# Patient Record
Sex: Male | Born: 1985 | Race: Black or African American | Hispanic: No | Marital: Single | Smoking: Current every day smoker
Health system: Southern US, Community
[De-identification: ages and names within clinical notes are randomized; demographics above are authoritative.]

## PROBLEM LIST (undated history)

## (undated) DIAGNOSIS — E109 Type 1 diabetes mellitus without complications: Secondary | ICD-10-CM

## (undated) DIAGNOSIS — S129XXA Fracture of neck, unspecified, initial encounter: Secondary | ICD-10-CM

## (undated) HISTORY — PX: NO PAST SURGERIES: SHX2092

---

## 2016-06-07 ENCOUNTER — Emergency Department
Admission: EM | Admit: 2016-06-07 | Discharge: 2016-06-07 | Disposition: A | Discharge status: Home / Self Care | Payer: 59 | Attending: Emergency Medicine | Admitting: Emergency Medicine

## 2016-06-07 ENCOUNTER — Emergency Department: Payer: 59

## 2016-06-07 ENCOUNTER — Encounter: Payer: Self-pay | Admitting: Medical Oncology

## 2016-06-07 DIAGNOSIS — S161XXA Strain of muscle, fascia and tendon at neck level, initial encounter: Secondary | ICD-10-CM

## 2016-06-07 DIAGNOSIS — S39012A Strain of muscle, fascia and tendon of lower back, initial encounter: Secondary | ICD-10-CM | POA: Diagnosis not present

## 2016-06-07 DIAGNOSIS — F17200 Nicotine dependence, unspecified, uncomplicated: Secondary | ICD-10-CM | POA: Insufficient documentation

## 2016-06-07 DIAGNOSIS — Y999 Unspecified external cause status: Secondary | ICD-10-CM | POA: Insufficient documentation

## 2016-06-07 DIAGNOSIS — Y92410 Unspecified street and highway as the place of occurrence of the external cause: Secondary | ICD-10-CM | POA: Diagnosis not present

## 2016-06-07 DIAGNOSIS — Y939 Activity, unspecified: Secondary | ICD-10-CM | POA: Insufficient documentation

## 2016-06-07 DIAGNOSIS — S199XXA Unspecified injury of neck, initial encounter: Secondary | ICD-10-CM | POA: Diagnosis present

## 2016-06-07 DIAGNOSIS — M25512 Pain in left shoulder: Secondary | ICD-10-CM | POA: Diagnosis not present

## 2016-06-07 MED ORDER — ACETAMINOPHEN-CODEINE #3 300-30 MG PO TABS: 1.0000 | ORAL_TABLET | Freq: Four times a day (QID) | ORAL | 0 refills | Status: DC | PRN

## 2016-06-07 MED ORDER — CYCLOBENZAPRINE HCL 5 MG PO TABS: 5.0000 mg | ORAL_TABLET | Freq: Three times a day (TID) | ORAL | 0 refills | Status: DC | PRN

## 2016-06-07 MED ORDER — ACETAMINOPHEN-CODEINE #3 300-30 MG PO TABS
1.0000 | ORAL_TABLET | Freq: Once | ORAL | Status: AC
Start: 2016-06-07 — End: 2016-06-07
  Administered 2016-06-07: 1 via ORAL
  Filled 2016-06-07: qty 1

## 2016-06-07 MED ORDER — CYCLOBENZAPRINE HCL 10 MG PO TABS
10.0000 mg | ORAL_TABLET | Freq: Once | ORAL | Status: AC
  Administered 2016-06-07: 10 mg via ORAL

## 2016-06-07 MED ORDER — CYCLOBENZAPRINE HCL 10 MG PO TABS
ORAL_TABLET | ORAL | Status: AC
  Filled 2016-06-07: qty 1
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
## 2016-07-05 ENCOUNTER — Encounter: Payer: Self-pay | Admitting: Emergency Medicine

## 2016-07-05 ENCOUNTER — Emergency Department
Admission: EM | Admit: 2016-07-05 | Discharge: 2016-07-05 | Disposition: A | Discharge status: Home / Self Care | Payer: 59 | Attending: Emergency Medicine | Admitting: Emergency Medicine

## 2016-07-05 DIAGNOSIS — F17210 Nicotine dependence, cigarettes, uncomplicated: Secondary | ICD-10-CM | POA: Insufficient documentation

## 2016-07-05 DIAGNOSIS — R739 Hyperglycemia, unspecified: Secondary | ICD-10-CM

## 2016-07-05 DIAGNOSIS — E1065 Type 1 diabetes mellitus with hyperglycemia: Secondary | ICD-10-CM | POA: Insufficient documentation

## 2016-07-05 HISTORY — DX: Type 1 diabetes mellitus without complications: E10.9

## 2016-07-05 HISTORY — DX: Fracture of neck, unspecified, initial encounter: S12.9XXA

## 2016-07-05 LAB — URINALYSIS, COMPLETE (UACMP) WITH MICROSCOPIC
BACTERIA UA: NONE SEEN
BILIRUBIN URINE: NEGATIVE
Glucose, UA: >=500 mg/dL — AB
Hgb urine dipstick: NEGATIVE
KETONES UR: 80 mg/dL — AB
Leukocytes, UA: NEGATIVE
Nitrite: NEGATIVE
PH: 5 (ref 5.0–8.0)
PROTEIN: NEGATIVE mg/dL
RBC / HPF: NONE SEEN RBC/hpf (ref 0–5)
Specific Gravity, Urine: 1.026 (ref 1.005–1.030)

## 2016-07-05 LAB — BASIC METABOLIC PANEL
Anion gap: 16 — ABNORMAL HIGH (ref 5–15)
BUN: 18 mg/dL (ref 6–20)
CHLORIDE: 91 mmol/L — AB (ref 101–111)
CO2: 23 mmol/L (ref 22–32)
CREATININE: 0.93 mg/dL (ref 0.61–1.24)
Calcium: 8.8 mg/dL — ABNORMAL LOW (ref 8.9–10.3)
GFR calc Af Amer: >60 mL/min (ref >60)
GFR calc non Af Amer: >60 mL/min (ref >60)
Glucose, Bld: 366 mg/dL — ABNORMAL HIGH (ref 65–99)
Potassium: 3.9 mmol/L (ref 3.5–5.1)
SODIUM: 130 mmol/L — AB (ref 135–145)

## 2016-07-05 LAB — GLUCOSE, CAPILLARY
GLUCOSE-CAPILLARY: 375 mg/dL — AB (ref 65–99)
GLUCOSE-CAPILLARY: 206 mg/dL — AB (ref 65–99)
Glucose-Capillary: 188 mg/dL — ABNORMAL HIGH (ref 65–99)

## 2016-07-05 LAB — CBC
HCT: 40.8 % (ref 40.0–52.0)
Hemoglobin: 13.6 g/dL (ref 13.0–18.0)
MCH: 31.3 pg (ref 26.0–34.0)
MCHC: 33.3 g/dL (ref 32.0–36.0)
MCV: 94.1 fL (ref 80.0–100.0)
Platelets: 249 10*3/uL (ref 150–440)
RBC: 4.34 MIL/uL — ABNORMAL LOW (ref 4.40–5.90)
RDW: 12.9 % (ref 11.5–14.5)
WBC: 14.3 10*3/uL — AB (ref 3.8–10.6)

## 2016-07-05 MED ORDER — INSULIN ASPART 100 UNIT/ML ~~LOC~~ SOLN
2.0000 [IU] | Freq: Once | SUBCUTANEOUS | Status: AC
  Administered 2016-07-05: 2 [IU] via INTRAVENOUS
  Filled 2016-07-05: qty 2

## 2016-07-05 MED ORDER — SODIUM CHLORIDE 0.9 % IV SOLN
Freq: Once | INTRAVENOUS | Status: AC
  Administered 2016-07-05: 13:00:00 via INTRAVENOUS

## 2016-07-05 NOTE — Discharge Instructions (Signed)
Please monitor your blood glucoses carefully. Use your insulin to keep your sugar under control Please follow-up with [facility] or the doctor of your choice. Please return here if you're sugars get high again. Also return for fever vomiting abdominal pain or feeling sicker.

## 2016-07-05 NOTE — ED Provider Notes (Signed)
[facility] Emergency Department Provider Note   ____________________________________________   First MD Initiated Contact with Patient 07/05/16 1239     (approximate)  I have reviewed the triage vital signs and the nursing notes.   HISTORY  Chief Complaint Hyperglycemia    HPI Wilferd Ritson is a 31 y.o. male patient had some fried pork yesterday. He vomited several times after that drank a lot of sports drinks to help with the vomiting and noticed his sugar was going up. Didn't really feel all that well since then. Today his sugar was high. He came in by EMS. He feels generally okay but not really well. Nothing starting him. He is not running a fever. He is not having any further vomiting dysuria coughing or any other complaints.  Past Medical History:  Diagnosis Date  . Compression fracture of C-spine (HCC)   . Type 1 diabetes (HCC)     There are no active problems to display for this patient.   History reviewed. No pertinent surgical history.  Prior to Admission medications   Medication Sig Start Date End Date Taking? Authorizing Provider  acetaminophen-codeine (TYLENOL #3) 300-30 MG tablet Take 1 tablet by mouth every 6 (six) hours as needed for moderate pain. 06/07/16   Menshew, Charlesetta Ivory, PA-C  cyclobenzaprine (FLEXERIL) 5 MG tablet Take 1 tablet (5 mg total) by mouth 3 (three) times daily as needed for muscle spasms. 06/07/16   Menshew, Charlesetta Ivory, PA-C    Allergies Patient has no known allergies.  No family history on file.  Social History Social History  Substance Use Topics  . Smoking status: Current Every Day Smoker    Packs/day: 0.50    Types: Cigarettes  . Smokeless tobacco: Never Used  . Alcohol use Yes     Comment: occasionally    Review of Systems  Constitutional: No fever/chills Eyes: No visual changes. ENT: No sore throat. Cardiovascular: Denies chest pain. Respiratory: Denies shortness of  breath. Gastrointestinal: No abdominal pain.  No nausea, no vomiting.  No diarrhea.  No constipation. Genitourinary: Negative for dysuria. Musculoskeletal: Negative for back pain. Skin: Negative for rash. Neurological: Negative for headaches, focal weakness or numbness.   ____________________________________________   PHYSICAL EXAM:  VITAL SIGNS: ED Triage Vitals  Enc Vitals Group     BP 07/05/16 1245 (!) 145/110     Pulse Rate 07/05/16 1245 (!) 101     Resp 07/05/16 1245 18     Temp 07/05/16 1245 98.5 F (36.9 C)     Temp Source 07/05/16 1245 Oral     SpO2 07/05/16 1245 100 %     Weight 07/05/16 1246 130 lb (59 kg)     Height 07/05/16 1246 5\' 9"  (1.753 m)     Head Circumference --      Peak Flow --      Pain Score 07/05/16 1245 0     Pain Loc --      Pain Edu? --      Excl. in GC? --     Constitutional: Alert and oriented. Well appearing and in no acute distress. Eyes: Conjunctivae are normal. PERRL. EOMI. Head: Atraumatic. Nose: No congestion/rhinnorhea. Mouth/Throat: Mucous membranes are moist.  Oropharynx non-erythematous. Neck: No stridor.  Cardiovascular: Normal rate, regular rhythm. Grossly normal heart sounds.  Good peripheral circulation. Respiratory: Normal respiratory effort.  No retractions. Lungs CTAB. Gastrointestinal: Soft and nontender. No distention. No abdominal bruits. No CVA tenderness. Musculoskeletal: No lower extremity tenderness nor edema.  No joint effusions. Neurologic:  Normal speech and language. No gross focal neurologic deficits are appreciated. No gait instability. Skin:  Skin is warm, dry and intact. No rash noted. Psychiatric: Mood and affect are normal. Speech and behavior are normal.  ____________________________________________   LABS (all labs ordered are listed, but only abnormal results are displayed)  Labs Reviewed  BASIC METABOLIC PANEL - Abnormal; Notable for the following:       Result Value   Sodium 130 (*)     Chloride 91 (*)    Glucose, Bld 366 (*)    Calcium 8.8 (*)    Anion gap 16 (*)    All other components within normal limits  CBC - Abnormal; Notable for the following:    WBC 14.3 (*)    RBC 4.34 (*)    All other components within normal limits  URINALYSIS, COMPLETE (UACMP) WITH MICROSCOPIC - Abnormal; Notable for the following:    Color, Urine STRAW (*)    APPearance CLEAR (*)    Glucose, UA >=500 (*)    Ketones, ur 80 (*)    Squamous Epithelial / LPF 0-5 (*)    All other components within normal limits  GLUCOSE, CAPILLARY - Abnormal; Notable for the following:    Glucose-Capillary 375 (*)    All other components within normal limits  GLUCOSE, CAPILLARY - Abnormal; Notable for the following:    Glucose-Capillary 188 (*)    All other components within normal limits  GLUCOSE, CAPILLARY  CBG MONITORING, ED   ____________________________________________  EKG   ____________________________________________  RADIOLOGY   ____________________________________________   PROCEDURES  Procedure(s) performed:  Procedures  Critical Care performed:   ____________________________________________   INITIAL IMPRESSION / ASSESSMENT AND PLAN / ED COURSE  Pertinent labs & imaging results that were available during my care of the patient were reviewed by me and considered in my medical decision making (see chart for details).   Patient's blood sugar is now down remains down even when eating. He feels much better. I will let him go.     ____________________________________________   FINAL CLINICAL IMPRESSION(S) / ED DIAGNOSES  Final diagnoses:  Hyperglycemia      NEW MEDICATIONS STARTED DURING THIS VISIT:  New Prescriptions   No medications on file     Note:  This document was prepared using Dragon voice recognition software and may include unintentional dictation errors.    Arnaldo NatalMalinda, Paul F, MD 07/05/16 1536

## 2016-07-05 NOTE — ED Triage Notes (Signed)
Patient presents to the ED via EMS for high blood sugar and vomiting.  Patient reports vomiting approx. 4 times today and reports noticing his sugar was high last night.  Patient denies any pain.  Patient reports eating 2 fried pork chops last night and states he didn't feel well after that.  Patient is in no obvious distress at this time.

## 2016-07-05 NOTE — ED Notes (Signed)
Patient discharged with instructions to establish with a pcp. Patient refused wheel chair. Patients rise will pick him up around 5:40pm. Patient discharged to lobby.

## 2017-02-12 ENCOUNTER — Encounter: Payer: Self-pay | Admitting: Emergency Medicine

## 2017-02-12 ENCOUNTER — Emergency Department
Admission: EM | Admit: 2017-02-12 | Discharge: 2017-02-12 | Disposition: A | Discharge status: Home / Self Care | Payer: 59 | Attending: Student in an Organized Health Care Education/Training Program | Admitting: Student in an Organized Health Care Education/Training Program

## 2017-02-12 ENCOUNTER — Other Ambulatory Visit: Payer: Self-pay

## 2017-02-12 DIAGNOSIS — F17210 Nicotine dependence, cigarettes, uncomplicated: Secondary | ICD-10-CM | POA: Insufficient documentation

## 2017-02-12 DIAGNOSIS — Y999 Unspecified external cause status: Secondary | ICD-10-CM | POA: Insufficient documentation

## 2017-02-12 DIAGNOSIS — E1065 Type 1 diabetes mellitus with hyperglycemia: Secondary | ICD-10-CM | POA: Insufficient documentation

## 2017-02-12 DIAGNOSIS — Y939 Activity, unspecified: Secondary | ICD-10-CM | POA: Insufficient documentation

## 2017-02-12 DIAGNOSIS — Y929 Unspecified place or not applicable: Secondary | ICD-10-CM | POA: Insufficient documentation

## 2017-02-12 DIAGNOSIS — Z794 Long term (current) use of insulin: Secondary | ICD-10-CM | POA: Insufficient documentation

## 2017-02-12 DIAGNOSIS — X58XXXA Exposure to other specified factors, initial encounter: Secondary | ICD-10-CM | POA: Insufficient documentation

## 2017-02-12 DIAGNOSIS — S90422A Blister (nonthermal), left great toe, initial encounter: Secondary | ICD-10-CM | POA: Insufficient documentation

## 2017-02-12 DIAGNOSIS — IMO0001 Reserved for inherently not codable concepts without codable children: Secondary | ICD-10-CM

## 2017-02-12 LAB — GLUCOSE, CAPILLARY: GLUCOSE-CAPILLARY: 299 mg/dL — AB (ref 65–99)

## 2017-02-12 MED ORDER — CEPHALEXIN 500 MG PO CAPS: 500.0000 mg | ORAL_CAPSULE | Freq: Three times a day (TID) | ORAL | 0 refills | Status: DC

## 2017-02-12 MED ORDER — SULFAMETHOXAZOLE-TRIMETHOPRIM 800-160 MG PO TABS
1.0000 | ORAL_TABLET | Freq: Once | ORAL | Status: AC
  Administered 2017-02-12: 1 via ORAL
  Filled 2017-02-12: qty 1

## 2017-02-12 MED ORDER — HYDROCODONE-ACETAMINOPHEN 5-325 MG PO TABS
1.0000 | ORAL_TABLET | Freq: Once | ORAL | Status: AC
  Administered 2017-02-12: 1 via ORAL
  Filled 2017-02-12: qty 1

## 2017-02-12 MED ORDER — HYDROCODONE-ACETAMINOPHEN 5-325 MG PO TABS: 1.0000 | ORAL_TABLET | Freq: Four times a day (QID) | ORAL | 0 refills | Status: DC | PRN

## 2017-02-12 MED ORDER — SULFAMETHOXAZOLE-TRIMETHOPRIM 800-160 MG PO TABS: 1.0000 | ORAL_TABLET | Freq: Two times a day (BID) | ORAL | 0 refills | Status: DC

## 2017-02-12 MED ORDER — CEPHALEXIN 500 MG PO CAPS
500.0000 mg | ORAL_CAPSULE | Freq: Once | ORAL | Status: AC
  Administered 2017-02-12: 500 mg via ORAL
  Filled 2017-02-12: qty 1

## 2017-02-12 NOTE — ED Provider Notes (Signed)
[facility] Emergency Department Provider Note   ____________________________________________   First MD Initiated Contact with Patient 02/12/17 1243     (approximate)  I have reviewed the triage vital signs and the nursing notes.   HISTORY  Chief Complaint Blister   HPI Garrett [name] is a 31 y.o. male if you have complaint of blister to his left great toe that began approximately one week ago. Patient states that he wears work boots that were up against his toe. He states that blister is now open and draining. Patient is concerned because he is an insulin-dependent diabetic. He denies any fever or chills. He denies any previous problems with his foot.he rates his pain is 7 out of 10.   Past Medical History:  Diagnosis Date  . Compression fracture of C-spine (HCC)   . Type 1 diabetes (HCC)     There are no active problems to display for this patient.   No past surgical history on file.  Prior to Admission medications   Medication Sig Start Date End Date Taking? Authorizing Provider  insulin aspart (NOVOLOG) 100 UNIT/ML injection Inject into the skin 3 (three) times daily before meals. Uses sliding scale - max 14u at any dose   Yes [provider]  insulin aspart protamine- aspart (NOVOLOG MIX 70/30) (70-30) 100 UNIT/ML injection Inject 30 Units into the skin 2 (two) times daily.   Yes [provider]  cephALEXin (KEFLEX) 500 MG capsule Take 1 capsule (500 mg total) by mouth 3 (three) times daily. 02/12/17   Tommi RumpsSummers, Pammie Chirino L, PA-C  HYDROcodone-acetaminophen (NORCO/VICODIN) 5-325 MG tablet Take 1 tablet by mouth every 6 (six) hours as needed for moderate pain. 02/12/17   Tommi RumpsSummers, Johnette Teigen L, PA-C  sulfamethoxazole-trimethoprim (BACTRIM DS,SEPTRA DS) 800-160 MG tablet Take 1 tablet by mouth 2 (two) times daily. 02/12/17   Tommi RumpsSummers, Nycere Presley L, PA-C    Allergies Patient has no known allergies.  No family history on file.  Social  History Social History   Tobacco Use  . Smoking status: Current Every Day Smoker    Packs/day: 0.50    Types: Cigarettes  . Smokeless tobacco: Never Used  Substance Use Topics  . Alcohol use: Yes    Comment: occasionally  . Drug use: No    Review of Systems Constitutional: No fever/chills Cardiovascular: Denies chest pain. Respiratory: Denies shortness of breath. Musculoskeletal: positive left foot pain. Skin: positive skin injury left foot. Neurological: Negative for  focal weakness or numbness. ____________________________________________   PHYSICAL EXAM:  VITAL SIGNS: ED Triage Vitals  Enc Vitals Group     BP 02/12/17 1139 137/88     Pulse --      Resp 02/12/17 1139 18     Temp 02/12/17 1139 97.8 F (36.6 C)     Temp Source 02/12/17 1139 Oral     SpO2 02/12/17 1139 99 %     Weight 02/12/17 1140 130 lb (59 kg)     Height 02/12/17 1140 5\' 9"  (1.753 m)     Head Circumference --      Peak Flow --      Pain Score 02/12/17 1139 7     Pain Loc --      Pain Edu? --      Excl. in GC? --    Constitutional: Alert and oriented. Well appearing and in no acute distress. Eyes: Conjunctivae are normal.  Head: Atraumatic. Neck: No stridor.   Cardiovascular: Normal rate, regular rhythm. Grossly normal heart sounds.  Good peripheral circulation. Respiratory: Normal respiratory effort.  No retractions. Lungs CTAB. Musculoskeletal: moves upper and lower extremities without any difficulty. Normal gait was noted. Neurologic:  Normal speech and language. No gross focal neurologic deficits are appreciated. No gait instability. Skin:  Skin is warm, dry. Patient has a  shallow blister to the medial aspect of his great toe. No active drainage is noted. No erythema is present. No soft tissue swelling. Psychiatric: Mood and affect are normal. Speech and behavior are normal.  ____________________________________________   LABS (all labs ordered are listed, but only abnormal results are  displayed)  Labs Reviewed  GLUCOSE, CAPILLARY - Abnormal; Notable for the following components:      Result Value   Glucose-Capillary 299 (*)    All other components within normal limits  CBG MONITORING, ED     PROCEDURES  Procedure(s) performed: None  Procedures  Critical Care performed: No  ____________________________________________   INITIAL IMPRESSION / ASSESSMENT AND PLAN / ED COURSE Patient has not seen a PCP in "long time" and does not check his blood sugars. He was unaware that his glucose was elevated. Patient great toe was dressed. Patient is to clean daily with mild soap and water and watch for continued worsening. Patient was placed on Keflex 500 mg 3 times a day for 10 days and Bactrim DS twice a day for 10 days. Patient was given a list of clinics that he was eligible he is sitting [facility]. Patient is encouraged to see one of these clinics very soon to get his diabetes under better control. He was also given a note to remain out of work for the next 2 days. ____________________________________________   FINAL CLINICAL IMPRESSION(S) / ED DIAGNOSES  Final diagnoses:  Blister of left great toe, initial encounter  Uncontrolled diabetes mellitus type 1 without complications [facility](HCC)     ED Discharge Orders        Ordered    cephALEXin (KEFLEX) 500 MG capsule  3 times daily     02/12/17 1408    sulfamethoxazole-trimethoprim (BACTRIM DS,SEPTRA DS) 800-160 MG tablet  2 times daily     02/12/17 1408    HYDROcodone-acetaminophen (NORCO/VICODIN) 5-325 MG tablet  Every 6 hours PRN     02/12/17 1408       Note:  This document was prepared using Dragon voice recognition software and may include unintentional dictation errors.    Tommi RumpsSummers, Dellamae Rosamilia L, PA-C 02/12/17 1535    Willy Eddyobinson, Patrick, MD 02/13/17 [phone]

## 2017-02-12 NOTE — ED Notes (Signed)
Pharmacy tech coming to update med list

## 2017-02-12 NOTE — ED Notes (Signed)
Dry sterile dressing applied to patients wound

## 2017-02-12 NOTE — Discharge Instructions (Addendum)
Call and make an appointment with one of the clinics listed on your discharge papers. [facility], [facility], [facility], and [facility] all have fees based on your income and much cheaper. You will need to get your diabetes under better control. Clean your blister daily with mild soap and water. Keep clean and dry. Change dressing often. Begin taking Keflex and Bactrim DS. These two medications are for infection. Also Norco every 6 hours if needed for pain.Do not drive while taking this medication.

## 2017-02-12 NOTE — ED Triage Notes (Signed)
Pt reports blister to left great toe that began approximately one week ago. Blister has now popped open. No obvious signs of infection. Pt reports pain in left foot. CMS intact. Pt reports wears work boots that rub. Ambulatory to triage. No apparent distress noted. Pt reports is an insulin dependent diabetic.

## 2017-03-27 ENCOUNTER — Inpatient Hospital Stay
Admission: EM | Admit: 2017-03-27 | Discharge: 2017-03-29 | DRG: 637 | Disposition: A | Discharge status: Home / Self Care | Payer: Self-pay | Attending: Internal Medicine | Admitting: Internal Medicine

## 2017-03-27 ENCOUNTER — Emergency Department: Payer: Self-pay

## 2017-03-27 ENCOUNTER — Inpatient Hospital Stay: Payer: Self-pay

## 2017-03-27 ENCOUNTER — Other Ambulatory Visit: Payer: Self-pay

## 2017-03-27 ENCOUNTER — Encounter: Payer: Self-pay | Admitting: Emergency Medicine

## 2017-03-27 DIAGNOSIS — G9340 Encephalopathy, unspecified: Secondary | ICD-10-CM

## 2017-03-27 DIAGNOSIS — E1311 Other specified diabetes mellitus with ketoacidosis with coma: Secondary | ICD-10-CM

## 2017-03-27 DIAGNOSIS — N179 Acute kidney failure, unspecified: Secondary | ICD-10-CM | POA: Diagnosis present

## 2017-03-27 DIAGNOSIS — E1110 Type 2 diabetes mellitus with ketoacidosis without coma: Secondary | ICD-10-CM | POA: Diagnosis present

## 2017-03-27 DIAGNOSIS — Z792 Long term (current) use of antibiotics: Secondary | ICD-10-CM

## 2017-03-27 DIAGNOSIS — R451 Restlessness and agitation: Secondary | ICD-10-CM | POA: Diagnosis present

## 2017-03-27 DIAGNOSIS — F17210 Nicotine dependence, cigarettes, uncomplicated: Secondary | ICD-10-CM | POA: Diagnosis present

## 2017-03-27 DIAGNOSIS — E1111 Type 2 diabetes mellitus with ketoacidosis with coma: Secondary | ICD-10-CM

## 2017-03-27 DIAGNOSIS — E1010 Type 1 diabetes mellitus with ketoacidosis without coma: Principal | ICD-10-CM | POA: Diagnosis present

## 2017-03-27 DIAGNOSIS — E872 Acidosis: Secondary | ICD-10-CM

## 2017-03-27 DIAGNOSIS — G9341 Metabolic encephalopathy: Secondary | ICD-10-CM | POA: Diagnosis present

## 2017-03-27 DIAGNOSIS — E860 Dehydration: Secondary | ICD-10-CM | POA: Diagnosis present

## 2017-03-27 LAB — COMPREHENSIVE METABOLIC PANEL
ALBUMIN: 3.7 g/dL (ref 3.5–5.0)
ALT: 36 U/L (ref 17–63)
AST: 35 U/L (ref 15–41)
Alkaline Phosphatase: 233 U/L — ABNORMAL HIGH (ref 38–126)
BUN: 36 mg/dL — AB (ref 6–20)
CHLORIDE: 92 mmol/L — AB (ref 101–111)
CO2: <7 mmol/L — ABNORMAL LOW (ref 22–32)
CREATININE: 2.27 mg/dL — AB (ref 0.61–1.24)
Calcium: 10.1 mg/dL (ref 8.9–10.3)
GFR calc Af Amer: 42 mL/min — ABNORMAL LOW (ref >60)
GFR, EST NON AFRICAN AMERICAN: 36 mL/min — AB (ref >60)
GLUCOSE: 1190 mg/dL — AB (ref 65–99)
POTASSIUM: 6.3 mmol/L — AB (ref 3.5–5.1)
Sodium: 132 mmol/L — ABNORMAL LOW (ref 135–145)
Total Bilirubin: 2.4 mg/dL — ABNORMAL HIGH (ref 0.3–1.2)
Total Protein: 7.5 g/dL (ref 6.5–8.1)

## 2017-03-27 LAB — BASIC METABOLIC PANEL
ANION GAP: 26 — AB (ref 5–15)
ANION GAP: 14 (ref 5–15)
BUN: 37 mg/dL — ABNORMAL HIGH (ref 6–20)
BUN: 32 mg/dL — ABNORMAL HIGH (ref 6–20)
BUN: 25 mg/dL — ABNORMAL HIGH (ref 6–20)
CALCIUM: 8.6 mg/dL — AB (ref 8.9–10.3)
CO2: <7 mmol/L — ABNORMAL LOW (ref 22–32)
CO2: 9 mmol/L — AB (ref 22–32)
CO2: 22 mmol/L (ref 22–32)
Calcium: 9.5 mg/dL (ref 8.9–10.3)
Calcium: 9.1 mg/dL (ref 8.9–10.3)
Chloride: 97 mmol/L — ABNORMAL LOW (ref 101–111)
Chloride: 108 mmol/L (ref 101–111)
Chloride: 112 mmol/L — ABNORMAL HIGH (ref 101–111)
Creatinine, Ser: 2.21 mg/dL — ABNORMAL HIGH (ref 0.61–1.24)
Creatinine, Ser: 1.79 mg/dL — ABNORMAL HIGH (ref 0.61–1.24)
Creatinine, Ser: 1.25 mg/dL — ABNORMAL HIGH (ref 0.61–1.24)
GFR calc non Af Amer: 37 mL/min — ABNORMAL LOW (ref >60)
GFR calc non Af Amer: 48 mL/min — ABNORMAL LOW (ref >60)
GFR calc non Af Amer: >60 mL/min (ref >60)
GFR, EST AFRICAN AMERICAN: 43 mL/min — AB (ref >60)
GFR, EST AFRICAN AMERICAN: 56 mL/min — AB (ref >60)
GLUCOSE: 1127 mg/dL — AB (ref 65–99)
GLUCOSE: 570 mg/dL — AB (ref 65–99)
GLUCOSE: 108 mg/dL — AB (ref 65–99)
POTASSIUM: 3.7 mmol/L (ref 3.5–5.1)
POTASSIUM: 3.6 mmol/L (ref 3.5–5.1)
Potassium: 6.1 mmol/L — ABNORMAL HIGH (ref 3.5–5.1)
Sodium: 136 mmol/L (ref 135–145)
Sodium: 143 mmol/L (ref 135–145)
Sodium: 148 mmol/L — ABNORMAL HIGH (ref 135–145)

## 2017-03-27 LAB — LACTIC ACID, PLASMA
LACTIC ACID, VENOUS: 1.4 mmol/L (ref 0.5–1.9)
Lactic Acid, Venous: 5.6 mmol/L (ref 0.5–1.9)

## 2017-03-27 LAB — GLUCOSE, CAPILLARY
GLUCOSE-CAPILLARY: 505 mg/dL — AB (ref 65–99)
GLUCOSE-CAPILLARY: 387 mg/dL — AB (ref 65–99)
GLUCOSE-CAPILLARY: 252 mg/dL — AB (ref 65–99)
GLUCOSE-CAPILLARY: 202 mg/dL — AB (ref 65–99)
GLUCOSE-CAPILLARY: 130 mg/dL — AB (ref 65–99)
GLUCOSE-CAPILLARY: 98 mg/dL (ref 65–99)
Glucose-Capillary: 118 mg/dL — ABNORMAL HIGH (ref 65–99)
Glucose-Capillary: >600 mg/dL (ref 65–99)
Glucose-Capillary: >600 mg/dL (ref 65–99)
Glucose-Capillary: >600 mg/dL (ref 65–99)
Glucose-Capillary: >600 mg/dL (ref 65–99)

## 2017-03-27 LAB — BLOOD GAS, VENOUS
PATIENT TEMPERATURE: 37
pCO2, Ven: 19 mmHg — CL (ref 44.0–60.0)
pH, Ven: <6.9 — CL (ref 7.250–7.430)
pO2, Ven: 55 mmHg — ABNORMAL HIGH (ref 32.0–45.0)

## 2017-03-27 LAB — URINE DRUG SCREEN, QUALITATIVE (ARMC ONLY)
Amphetamines, Ur Screen: NOT DETECTED
Barbiturates, Ur Screen: NOT DETECTED
Benzodiazepine, Ur Scrn: NOT DETECTED
Cannabinoid 50 Ng, Ur ~~LOC~~: NOT DETECTED
Cocaine Metabolite,Ur ~~LOC~~: NOT DETECTED
MDMA (Ecstasy)Ur Screen: NOT DETECTED
Methadone Scn, Ur: NOT DETECTED
Opiate, Ur Screen: NOT DETECTED
Phencyclidine (PCP) Ur S: NOT DETECTED
Tricyclic, Ur Screen: NOT DETECTED

## 2017-03-27 LAB — CBC WITH DIFFERENTIAL/PLATELET
BASOS PCT: 0 %
Basophils Absolute: 0 10*3/uL (ref 0–0.1)
EOS ABS: 0 10*3/uL (ref 0–0.7)
EOS PCT: 0 %
HCT: 46.8 % (ref 40.0–52.0)
Hemoglobin: 12.7 g/dL — ABNORMAL LOW (ref 13.0–18.0)
LYMPHS PCT: 11 %
Lymphs Abs: 1.7 10*3/uL (ref 1.0–3.6)
MCH: 31.5 pg (ref 26.0–34.0)
MCHC: 27 g/dL — ABNORMAL LOW (ref 32.0–36.0)
MCV: 116.5 fL — AB (ref 80.0–100.0)
Monocytes Absolute: 0.9 10*3/uL (ref 0.2–1.0)
Monocytes Relative: 6 %
NEUTROS PCT: 83 %
Neutro Abs: 12.4 10*3/uL — ABNORMAL HIGH (ref 1.4–6.5)
PLATELETS: 322 10*3/uL (ref 150–440)
RBC: 4.02 MIL/uL — ABNORMAL LOW (ref 4.40–5.90)
RDW: 16.5 % — ABNORMAL HIGH (ref 11.5–14.5)
SMEAR REVIEW: ADEQUATE
WBC: 15 10*3/uL — ABNORMAL HIGH (ref 3.8–10.6)

## 2017-03-27 LAB — TROPONIN I: Troponin I: <0.03 ng/mL (ref <0.03)

## 2017-03-27 LAB — URINALYSIS, ROUTINE W REFLEX MICROSCOPIC
BACTERIA UA: NONE SEEN
Bilirubin Urine: NEGATIVE
Glucose, UA: >=500 mg/dL — AB
KETONES UR: 80 mg/dL — AB
LEUKOCYTES UA: NEGATIVE
NITRITE: NEGATIVE
PH: 5 (ref 5.0–8.0)
Protein, ur: NEGATIVE mg/dL
SPECIFIC GRAVITY, URINE: 1.022 (ref 1.005–1.030)
SQUAMOUS EPITHELIAL / LPF: NONE SEEN

## 2017-03-27 LAB — ETHANOL: Alcohol, Ethyl (B): <10 mg/dL (ref <10)

## 2017-03-27 LAB — MRSA PCR SCREENING: MRSA by PCR: POSITIVE — AB

## 2017-03-27 LAB — BLOOD GAS, ARTERIAL
ACID-BASE DEFICIT: 12.8 mmol/L — AB (ref 0.0–2.0)
Bicarbonate: 11.8 mmol/L — ABNORMAL LOW (ref 20.0–28.0)
FIO2: 0.21
O2 Saturation: 96.6 %
PATIENT TEMPERATURE: 37
pCO2 arterial: 24 mmHg — ABNORMAL LOW (ref 32.0–48.0)
pH, Arterial: 7.3 — ABNORMAL LOW (ref 7.350–7.450)
pO2, Arterial: 96 mmHg (ref 83.0–108.0)

## 2017-03-27 LAB — LIPASE, BLOOD: Lipase: 69 U/L — ABNORMAL HIGH (ref 11–51)

## 2017-03-27 LAB — INFLUENZA PANEL BY PCR (TYPE A & B)
Influenza A By PCR: NEGATIVE
Influenza B By PCR: NEGATIVE

## 2017-03-27 LAB — BETA-HYDROXYBUTYRIC ACID: Beta-Hydroxybutyric Acid: >8 mmol/L — ABNORMAL HIGH (ref 0.05–0.27)

## 2017-03-27 LAB — MAGNESIUM: Magnesium: 2.9 mg/dL — ABNORMAL HIGH (ref 1.7–2.4)

## 2017-03-27 LAB — PROCALCITONIN: Procalcitonin: 0.43 ng/mL

## 2017-03-27 LAB — PHOSPHORUS: PHOSPHORUS: 9.9 mg/dL — AB (ref 2.5–4.6)

## 2017-03-27 MED ORDER — MUPIROCIN 2 % EX OINT
1.0000 "application " | TOPICAL_OINTMENT | Freq: Two times a day (BID) | CUTANEOUS | Status: DC
  Administered 2017-03-27 – 2017-03-29 (×4): 1 via NASAL
  Filled 2017-03-27: qty 22

## 2017-03-27 MED ORDER — SODIUM CHLORIDE 0.9 % IV BOLUS (SEPSIS)
1000.0000 mL | INTRAVENOUS | Status: AC
  Administered 2017-03-27: 1000 mL via INTRAVENOUS

## 2017-03-27 MED ORDER — SODIUM CHLORIDE 0.9 % IV SOLN
INTRAVENOUS | Status: DC
  Administered 2017-03-27: 14:00:00 via INTRAVENOUS

## 2017-03-27 MED ORDER — SODIUM BICARBONATE 8.4 % IV SOLN
INTRAVENOUS | Status: DC
  Administered 2017-03-27: 21:00:00 via INTRAVENOUS
  Filled 2017-03-27 (×2): qty 150

## 2017-03-27 MED ORDER — INSULIN ASPART 100 UNIT/ML ~~LOC~~ SOLN
0.0000 [IU] | SUBCUTANEOUS | Status: DC
  Administered 2017-03-28: 3 [IU] via SUBCUTANEOUS
  Filled 2017-03-27: qty 1

## 2017-03-27 MED ORDER — SODIUM CHLORIDE 0.9 % IV SOLN
INTRAVENOUS | Status: DC
  Administered 2017-03-27: 5.4 [IU]/h via INTRAVENOUS
  Administered 2017-03-27: 13.1 [IU]/h via INTRAVENOUS
  Filled 2017-03-27 (×2): qty 1

## 2017-03-27 MED ORDER — POTASSIUM CHLORIDE 10 MEQ/100ML IV SOLN
10.0000 meq | INTRAVENOUS | Status: AC
  Administered 2017-03-27 (×2): 10 meq via INTRAVENOUS
  Filled 2017-03-27 (×2): qty 100

## 2017-03-27 MED ORDER — DEXTROSE-NACL 5-0.45 % IV SOLN: INTRAVENOUS | Status: DC

## 2017-03-27 MED ORDER — MIDAZOLAM HCL 5 MG/5ML IJ SOLN
INTRAMUSCULAR | Status: AC
  Filled 2017-03-27: qty 5

## 2017-03-27 MED ORDER — ENOXAPARIN SODIUM 40 MG/0.4ML ~~LOC~~ SOLN
40.0000 mg | SUBCUTANEOUS | Status: DC
  Administered 2017-03-27 – 2017-03-28 (×2): 40 mg via SUBCUTANEOUS
  Filled 2017-03-27 (×2): qty 0.4

## 2017-03-27 MED ORDER — SODIUM CHLORIDE 0.9 % IV SOLN
2.0000 g | Freq: Two times a day (BID) | INTRAVENOUS | Status: DC
  Administered 2017-03-27: 2 g via INTRAVENOUS
  Filled 2017-03-27 (×2): qty 2

## 2017-03-27 MED ORDER — SODIUM BICARBONATE 8.4 % IV SOLN
50.0000 meq | Freq: Once | INTRAVENOUS | Status: AC
  Administered 2017-03-27: 50 meq via INTRAVENOUS
  Filled 2017-03-27: qty 50

## 2017-03-27 MED ORDER — SODIUM CHLORIDE 0.9 % IV SOLN
INTRAVENOUS | Status: DC
  Filled 2017-03-27: qty 1

## 2017-03-27 MED ORDER — SODIUM CHLORIDE 0.9 % IV SOLN: INTRAVENOUS | Status: AC

## 2017-03-27 MED ORDER — DEXMEDETOMIDINE HCL IN NACL 400 MCG/100ML IV SOLN
0.4000 ug/kg/h | INTRAVENOUS | Status: DC
  Administered 2017-03-27: 0.4 ug/kg/h via INTRAVENOUS
  Administered 2017-03-27 (×2): 1 ug/kg/h via INTRAVENOUS
  Filled 2017-03-27 (×3): qty 100

## 2017-03-27 MED ORDER — STERILE WATER FOR INJECTION IV SOLN
INTRAVENOUS | Status: DC
  Administered 2017-03-27: 15:00:00 via INTRAVENOUS
  Filled 2017-03-27 (×4): qty 850

## 2017-03-27 MED ORDER — ALBUTEROL SULFATE (2.5 MG/3ML) 0.083% IN NEBU: 5.0000 mg | INHALATION_SOLUTION | Freq: Once | RESPIRATORY_TRACT | Status: DC

## 2017-03-27 MED ORDER — INSULIN GLARGINE 100 UNIT/ML ~~LOC~~ SOLN
8.0000 [IU] | SUBCUTANEOUS | Status: DC
  Administered 2017-03-28: 8 [IU] via SUBCUTANEOUS
  Filled 2017-03-27 (×2): qty 0.08

## 2017-03-27 MED ORDER — MIDAZOLAM HCL 5 MG/5ML IJ SOLN
2.0000 mg | Freq: Once | INTRAMUSCULAR | Status: AC
  Administered 2017-03-27: 2 mg via INTRAVENOUS

## 2017-03-27 MED ORDER — SODIUM CHLORIDE 0.9 % IV BOLUS (SEPSIS)
500.0000 mL | Freq: Once | INTRAVENOUS | Status: AC
  Administered 2017-03-27: 500 mL via INTRAVENOUS

## 2017-03-27 MED ORDER — CHLORHEXIDINE GLUCONATE CLOTH 2 % EX PADS
6.0000 | MEDICATED_PAD | Freq: Every day | CUTANEOUS | Status: DC
  Administered 2017-03-28 – 2017-03-29 (×2): 6 via TOPICAL

## 2017-03-27 NOTE — ED Provider Notes (Signed)
[facility] Emergency Department Provider Note  ____________________________________________   First MD Initiated Contact with Patient 03/27/17 1123     (approximate)  I have reviewed the triage vital signs and the nursing notes.   HISTORY  Chief Complaint Hyperglycemia and AMS   Level 5 caveat:  history/ROS limited by acute/critical illness  HPI Garrett [name] is a 32 y.o. male with a history of type 1 diabetes who presents by EMS critically ill with altered mental status and blood glucose levels reading "high".  According to EMS, he was at a friend's house and they said he had been vomiting since 2 AM, about 9 hours ago.  He is altered and combative, trying to get out of bed, unable to answer questions, breathing rapidly, and is toxic appearing.  He was given a Comptroller and I was made of his degree of altered mental status.  IV access was established and fluids were started immediately while lab work is pending.  See hospital course for more details.  Past Medical History:  Diagnosis Date  . Compression fracture of C-spine (HCC)   . Type 1 diabetes [facility])     Patient Active Problem List   Diagnosis Date Noted  . DKA (diabetic ketoacidoses) (HCC) 03/27/2017    History reviewed. No pertinent surgical history.  Prior to Admission medications   Medication Sig Start Date End Date Taking? Authorizing Provider  cephALEXin (KEFLEX) 500 MG capsule Take 1 capsule (500 mg total) by mouth 3 (three) times daily. 02/12/17   Tommi Rumps, PA-C  HYDROcodone-acetaminophen (NORCO/VICODIN) 5-325 MG tablet Take 1 tablet by mouth every 6 (six) hours as needed for moderate pain. 02/12/17   Tommi Rumps, PA-C  insulin aspart (NOVOLOG) 100 UNIT/ML injection Inject into the skin 3 (three) times daily before meals. Uses sliding scale - max 14u at any dose    [provider]  insulin aspart protamine- aspart (NOVOLOG MIX 70/30) (70-30) 100 UNIT/ML injection  Inject 30 Units into the skin 2 (two) times daily.    [provider]  sulfamethoxazole-trimethoprim (BACTRIM DS,SEPTRA DS) 800-160 MG tablet Take 1 tablet by mouth 2 (two) times daily. 02/12/17   Tommi Rumps, PA-C    Allergies Patient has no known allergies.  No family history on file.  Social History Social History   Tobacco Use  . Smoking status: Current Every Day Smoker    Packs/day: 0.50    Types: Cigarettes  . Smokeless tobacco: Never Used  Substance Use Topics  . Alcohol use: Yes    Comment: occasionally  . Drug use: No    Review of Systems Level 5 caveat:  history/ROS limited by acute/critical illness ____________________________________________   PHYSICAL EXAM:  VITAL SIGNS: ED Triage Vitals [03/27/17 1112]  Enc Vitals Group     BP 100/64     Pulse Rate (!) 104     Resp (!) 21     Temp      Temp src      SpO2 100 %     Weight 68 kg (150 lb)     Height 1.727 m (5\' 8" )     Head Circumference      Peak Flow      Pain Score      Pain Loc      Pain Edu?      Excl. in GC?     Constitutional: Altered and combative, eyes open but not following commands and not tracking objects in the room, attempting  to get out of bed, attempting to pull out IVs, urinating on the bed Eyes: Conjunctivae are normal.  Head: Atraumatic. Nose: No congestion/rhinnorhea. Mouth/Throat: Mucous membranes are dry Neck: No stridor.  No meningeal signs.   Cardiovascular: Mild tachycardia, regular rhythm. Good peripheral circulation. Grossly normal heart sounds. Respiratory: Normal respiratory effort.  No retractions. Lungs CTAB. Gastrointestinal: Thin body habitus.  Soft and nontender. No distention.  Musculoskeletal: No lower extremity tenderness nor edema. No gross deformities of extremities. Neurologic: Moving all 4 extremities but unable to participate in any neurological exam Skin:  Skin is warm, dry and intact. No rash  noted.   ____________________________________________   LABS (all labs ordered are listed, but only abnormal results are displayed)  Labs Reviewed  CBC WITH DIFFERENTIAL/PLATELET - Abnormal; Notable for the following components:      Result Value   WBC 15.0 (*)    RBC 4.02 (*)    Hemoglobin 12.7 (*)    MCV 116.5 (*)    MCHC 27.0 (*)    RDW 16.5 (*)    Neutro Abs 12.4 (*)    All other components within normal limits  COMPREHENSIVE METABOLIC PANEL - Abnormal; Notable for the following components:   Sodium 132 (*)    Potassium 6.3 (*)    Chloride 92 (*)    CO2 <7 (*)    Glucose, Bld 1,190 (*)    BUN 36 (*)    Creatinine, Ser 2.27 (*)    Alkaline Phosphatase 233 (*)    Total Bilirubin 2.4 (*)    GFR calc non Af Amer 36 (*)    GFR calc Af Amer 42 (*)    All other components within normal limits  LIPASE, BLOOD - Abnormal; Notable for the following components:   Lipase 69 (*)    All other components within normal limits  URINALYSIS, ROUTINE W REFLEX MICROSCOPIC - Abnormal; Notable for the following components:   Color, Urine STRAW (*)    APPearance CLEAR (*)    Glucose, UA >=500 (*)    Hgb urine dipstick SMALL (*)    Ketones, ur 80 (*)    All other components within normal limits  BETA-HYDROXYBUTYRIC ACID - Abnormal; Notable for the following components:   Beta-Hydroxybutyric Acid >8.00 (*)    All other components within normal limits  BLOOD GAS, VENOUS - Abnormal; Notable for the following components:   pH, Ven <6.900 (*)    pCO2, Ven 19 (*)    pO2, Ven 55.0 (*)    All other components within normal limits  GLUCOSE, CAPILLARY - Abnormal; Notable for the following components:   Glucose-Capillary >600 (*)    All other components within normal limits  MAGNESIUM - Abnormal; Notable for the following components:   Magnesium 2.9 (*)    All other components within normal limits  PHOSPHORUS - Abnormal; Notable for the following components:   Phosphorus 9.9 (*)    All other  components within normal limits  GLUCOSE, CAPILLARY - Abnormal; Notable for the following components:   Glucose-Capillary >600 (*)    All other components within normal limits  BASIC METABOLIC PANEL - Abnormal; Notable for the following components:   Potassium 6.1 (*)    Chloride 97 (*)    CO2 <7 (*)    Glucose, Bld 1,127 (*)    BUN 37 (*)    Creatinine, Ser 2.21 (*)    GFR calc non Af Amer 37 (*)    GFR calc Af Amer 43 (*)  All other components within normal limits  GLUCOSE, CAPILLARY - Abnormal; Notable for the following components:   Glucose-Capillary >600 (*)    All other components within normal limits  GLUCOSE, CAPILLARY - Abnormal; Notable for the following components:   Glucose-Capillary >600 (*)    All other components within normal limits  MRSA PCR SCREENING  ETHANOL  URINE DRUG SCREEN, QUALITATIVE ([facility] ONLY)  INFLUENZA PANEL BY PCR (TYPE A & B)  TROPONIN I  HIV ANTIBODY (ROUTINE TESTING)  BASIC METABOLIC PANEL  BASIC METABOLIC PANEL  BASIC METABOLIC PANEL  CBG MONITORING, ED   ____________________________________________  EKG  ED ECG REPORT I, Loleta Rose, the attending physician, personally viewed and interpreted this ECG.  Date: 03/27/2017 EKG Time: 11:09 Rate: 101 Rhythm: sinus tachycardia QRS Axis: normal Intervals: LVH w/ IVCD ST/T Wave abnormalities: Inverted T-waves in inferior and lateral leads Narrative Interpretation: no evidence of acute ischemia   ____________________________________________  RADIOLOGY I, Loleta Rose, personally viewed and evaluated these images (plain radiographs) as part of my medical decision making, as well as reviewing the written report by the radiologist.  ED MD interpretation: No evidence of infiltrate  Official radiology report(s): Dg Chest Portable 1 View  Result Date: 03/27/2017 CLINICAL DATA:  Diabetic ketoacidosis. EXAM: PORTABLE CHEST 1 VIEW COMPARISON:  None. FINDINGS: The heart size and mediastinal  contours are within normal limits. Both lungs are clear. No pneumothorax or pleural effusion is noted. The visualized skeletal structures are unremarkable. IMPRESSION: No acute cardiopulmonary abnormality seen. Electronically Signed   By: Lupita Raider, M.D.   On: 03/27/2017 13:56    ____________________________________________   PROCEDURES  Critical Care performed: Yes, see critical care procedure note(s)   Procedure(s) performed:   .Critical Care Performed by: Loleta Rose, MD Authorized by: Loleta Rose, MD   Critical care provider statement:    Critical care time (minutes):  45   Critical care time was exclusive of:  Separately billable procedures and treating other patients   Critical care was necessary to treat or prevent imminent or life-threatening deterioration of the following conditions:  Metabolic crisis   Critical care was time spent personally by me on the following activities:  Development of treatment plan with patient or surrogate, discussions with consultants, evaluation of patient's response to treatment, examination of patient, obtaining history from patient or surrogate, ordering and performing treatments and interventions, ordering and review of laboratory studies, ordering and review of radiographic studies, pulse oximetry, re-evaluation of patient's condition and review of old charts      ____________________________________________   INITIAL IMPRESSION / ASSESSMENT AND PLAN / ED COURSE  As part of my medical decision making, I reviewed the following data within the electronic MEDICAL RECORD NUMBER Nursing notes reviewed and incorporated, Labs reviewed , EKG interpreted , Old chart reviewed, Radiograph reviewed , Discussed with admitting physician (Dr. Eliane Decree), A phone consult was requested and obtained from this/these consultant(s) (intensivist, Dr. Belia Heman) and Notes from prior ED visits    Critically ill-appearing patient, altered, history of type 1  diabetes with too high to read fingerstick blood sugar.  Apparently he has been vomiting for at least 9 hours.  Differential diagnosis includes, but is not limited to, DKA, HHNC, intoxication with other drugs or alcohol, nonspecific infectious process.  History is limited due to the patient's altered mental status and necessarily limited EMS report.  I am starting IV fluids but will hold off on insulin until I know his potassium level.  Similarly the patient may  require sodium bicarb but this also will lower his potassium.  Standard lab work is pending including VBG and metabolic panel.  I will also check a chest x-ray and influenza swab given the prevalence in the community at this time.  She will require ICU care once he has been stabilized in the emergency department.  Clinical Course as of Mar 27 1505  Mon Mar 27, 2017  1130 Awaiting potassium results before treating, but pH critically low pH, Ven: (!!) <6.900 [CF]  1145 Potassium 6.3, will proceed with insulin drip and sodium bicarb 1 amp, and will page the ICU to discuss additional recommendations.  [CF]  1153 Spoke with Dr. Belia HemanKasa, confirmed current plan, will work on admission ASAP.  Insulin drip starting, giving sodium bicarb 1 ampule (50 meq)  [CF]  1154 Patient still tachypnea and is now dated on the Versed but maintaining his airway and respiratory rate.  [CF]  1159 Spoke in person with Dr. Auburn BilberryShreyang Patel with the hospitalist service who will admit. I let him know about my conversation with Dr. Belia HemanKasa and explained the patient is critically ill but has been stabilized and is ready for the ICU.  He acknowledged the plan.  [CF]  1216 UDS negative Ethanol negative  [CF]  1216 Creatinine: (!) 2.27 [CF]    Clinical Course User Index [CF] Loleta RoseForbach, Dereona Kolodny, MD    ____________________________________________  FINAL CLINICAL IMPRESSION(S) / ED DIAGNOSES  Final diagnoses:  Diabetic ketoacidosis with coma associated with type 2 diabetes mellitus  (HCC)  Acute renal failure   MEDICATIONS GIVEN DURING THIS VISIT:  Medications  insulin regular (NOVOLIN R,HUMULIN R) 100 Units in sodium chloride 0.9 % 100 mL (1 Units/mL) infusion (16.2 Units/hr Intravenous Rate/Dose Change 03/27/17 1447)  dexmedetomidine (PRECEDEX) 400 MCG/100ML (4 mcg/mL) infusion (1 mcg/kg/hr  68 kg Intravenous Rate/Dose Change 03/27/17 1447)  0.9 %  sodium chloride infusion ( Intravenous Stopped 03/27/17 1448)  0.9 %  sodium chloride infusion ( Intravenous Stopped 03/27/17 1443)  enoxaparin (LOVENOX) injection 40 mg (not administered)  dextrose 5 %-0.45 % sodium chloride infusion (not administered)  sodium bicarbonate 150 mEq in sterile water 1,000 mL infusion ( Intravenous New Bag/Given 03/27/17 1442)  sodium chloride 0.9 % bolus 1,000 mL (0 mLs Intravenous Stopped 03/27/17 1243)  midazolam (VERSED) 5 MG/5ML injection 2 mg (2 mg Intravenous Given 03/27/17 1126)  sodium chloride 0.9 % bolus 1,000 mL (1,000 mLs Intravenous Transfusing/Transfer 03/27/17 1256)  sodium bicarbonate injection 50 mEq (50 mEq Intravenous Given 03/27/17 1156)     ED Discharge Orders    None       Note:  This document was prepared using Dragon voice recognition software and may include unintentional dictation errors.    Loleta RoseForbach, Teirra Carapia, MD 03/27/17 [phone]

## 2017-03-27 NOTE — Progress Notes (Signed)
ANTIBIOTIC CONSULT NOTE - INITIAL  Pharmacy Consult for Cefepime  Indication: sepsis  No Known Allergies  Patient Measurements: Height: 5\' 10"  (177.8 cm)(estimated) Weight: 129 lb 10.1 oz (58.8 kg) IBW/kg (Calculated) : 73 Adjusted Body Weight:   Vital Signs: Temp: 100.2 F (37.9 C) (02/11 2100) Temp Source: Rectal (02/11 1403) BP: 88/55 (02/11 2000) Pulse Rate: 84 (02/11 2100) Intake/Output from previous day: No intake/output data recorded. Intake/Output from this shift: Total I/O In: -  Out: 100 [Urine:100]  Labs: Recent Labs    03/27/17 1108 03/27/17 1324 03/27/17 1724  WBC 15.0*  --   --   HGB 12.7*  --   --   PLT 322  --   --   CREATININE 2.27* 2.21* 1.79*   Estimated Creatinine Clearance: 48.8 mL/min (A) (by C-G formula based on SCr of 1.79 mg/dL (H)). No results for input(s): VANCOTROUGH, VANCOPEAK, VANCORANDOM, GENTTROUGH, GENTPEAK, GENTRANDOM, TOBRATROUGH, TOBRAPEAK, TOBRARND, AMIKACINPEAK, AMIKACINTROU, AMIKACIN in the last 72 hours.   Microbiology: Recent Results (from the past 720 hour(s))  MRSA PCR Screening     Status: Abnormal   Collection Time: 03/27/17  1:50 PM  Result Value Ref Range Status   MRSA by PCR POSITIVE (A) NEGATIVE Final    Comment:        The GeneXpert MRSA Assay (FDA approved for NASAL specimens only), is one component of a comprehensive MRSA colonization surveillance program. It is not intended to diagnose MRSA infection nor to guide or monitor treatment for MRSA infections. RESULT CALLED TO, READ BACK BY AND VERIFIED WITH: Ian MalkinCHARLIE FLEETWOOD RN AT 1555 03/27/17. MSS Performed at [facility], [address] [place], [place] [address]     Medical History: Past Medical History:  Diagnosis Date  . Compression fracture of C-spine (HCC)   . Type 1 diabetes (HCC)     Medications:  Medications Prior to Admission  Medication Sig Dispense Refill Last Dose  . cephALEXin (KEFLEX) 500 MG capsule Take 1 capsule (500  mg total) by mouth 3 (three) times daily. 30 capsule 0   . HYDROcodone-acetaminophen (NORCO/VICODIN) 5-325 MG tablet Take 1 tablet by mouth every 6 (six) hours as needed for moderate pain. 15 tablet 0   . insulin aspart (NOVOLOG) 100 UNIT/ML injection Inject into the skin 3 (three) times daily before meals. Uses sliding scale - max 14u at any dose   02/12/2017 at AM  . insulin aspart protamine- aspart (NOVOLOG MIX 70/30) (70-30) 100 UNIT/ML injection Inject 30 Units into the skin 2 (two) times daily.   02/11/2017 at PM  . sulfamethoxazole-trimethoprim (BACTRIM DS,SEPTRA DS) 800-160 MG tablet Take 1 tablet by mouth 2 (two) times daily. 20 tablet 0    Assessment: CrCl = 48.8 ml/min  Goal of Therapy:  resolution of infection   Plan:  Expected duration 7 days with resolution of temperature and/or normalization of WBC   Cefepime 2 gm IV Q12H ordered to start on 2/11 @ 22:00.   Hiyab Nhem D 03/27/2017,9:14 PM

## 2017-03-27 NOTE — Consult Note (Signed)
Name: Danna HeftyOrlando Lemar MRN: [id number] DOB: [birth year]     CONSULTATION DATE: 03/27/2017  REFERRING MD :  Maryanna ShapeForbath  CHIEF COMPLAINT:  agitation     HISTORY OF PRESENT ILLNESS:  89 yo AAM bought to ER by friend for mental status changes Patient with severe acidosis Has acute renal failure Patient very agitated, given versed, started on precedex Patient now on precedex infusion, somnelant  Critically ill   PAST MEDICAL HISTORY :   has a past medical history of Compression fracture of C-spine (HCC) and Type 1 diabetes (HCC).  has no past surgical history on file. Prior to Admission medications   Medication Sig Start Date End Date Taking? Authorizing Provider  cephALEXin (KEFLEX) 500 MG capsule Take 1 capsule (500 mg total) by mouth 3 (three) times daily. 02/12/17   Tommi RumpsSummers, Rhonda L, PA-C  HYDROcodone-acetaminophen (NORCO/VICODIN) 5-325 MG tablet Take 1 tablet by mouth every 6 (six) hours as needed for moderate pain. 02/12/17   Tommi RumpsSummers, Rhonda L, PA-C  insulin aspart (NOVOLOG) 100 UNIT/ML injection Inject into the skin 3 (three) times daily before meals. Uses sliding scale - max 14u at any dose    [provider]  insulin aspart protamine- aspart (NOVOLOG MIX 70/30) (70-30) 100 UNIT/ML injection Inject 30 Units into the skin 2 (two) times daily.    [provider]  sulfamethoxazole-trimethoprim (BACTRIM DS,SEPTRA DS) 800-160 MG tablet Take 1 tablet by mouth 2 (two) times daily. 02/12/17   Tommi RumpsSummers, Rhonda L, PA-C   No Known Allergies  FAMILY HISTORY:  family history is not on file. SOCIAL HISTORY:  reports that he has been smoking cigarettes.  He has been smoking about 0.50 packs per day. he has never used smokeless tobacco. He reports that he drinks alcohol. He reports that he does not use drugs.  REVIEW OF SYSTEMS:   Unobtainable due to critical illness  ALL OTHER ROS ARE NEGATIVE    VITAL SIGNS: Pulse Rate:  [94-108] 94 (02/11 1232) Resp:  [21-24] 21  (02/11 1232) BP: (90-107)/(58-64) 107/60 (02/11 1232) SpO2:  [98 %-100 %] 100 % (02/11 1232) Weight:  [150 lb (68 kg)] 150 lb (68 kg) (02/11 1112)  Physical Examination:  GENERAL:critically ill appearing, +resp distress HEAD: Normocephalic, atraumatic.  EYES: Pupils equal, round, reactive to light.  No scleral icterus.  MOUTH: Moist mucosal membrane. NECK: Supple. No thyromegaly. No nodules. No JVD.  PULMONARY: +rhonchi, +wheezing CARDIOVASCULAR: S1 and S2. Regular rate and rhythm. No murmurs, rubs, or gallops.  GASTROINTESTINAL: Soft, nontender, -distended. No masses. Positive bowel sounds. No hepatosplenomegaly.  MUSCULOSKELETAL: No swelling, clubbing, or edema.  NEUROLOGIC: lethargic SKIN:intact,warm,dry       Recent Labs  Lab 03/27/17 1108  NA 132*  K 6.3*  CL 92*  CO2 <7*  BUN 36*  CREATININE 2.27*  GLUCOSE 1,190*   Recent Labs  Lab 03/27/17 1108  HGB 12.7*  HCT 46.8  WBC 15.0*  PLT 322    ASSESSMENT / PLAN: 32 yo AAM with severe DKA wth severe encephalopathy from severe acidosis and acute renal failure  1.Insulin infusion Protocol 2.IVF's 3.start Bicarb Infusion, follow up ABG 4.follow chem & 5.precedex for agitation  Critical Care Time devoted to patient care services described in this note is 45 minutes.   Overall, patient is critically ill, prognosis is guarded.  Patient with Multiorgan failure and at high risk for cardiac arrest and death.    Lucie LeatherKurian David Vicktoria Muckey, M.D.  [facility]  Medical Director [facility] [place]  Medical Director [place] [facility]

## 2017-03-27 NOTE — ED Notes (Signed)
Trained Recruitment consultantsafety sitter at bedside

## 2017-03-27 NOTE — ED Notes (Signed)
Patient altered and pulling off leads and attempting to pull out IVs. MD made aware. See medication orders. Orvilla Fusommy, EMT-P at bedside as Recruitment consultantsafety sitter.

## 2017-03-27 NOTE — Progress Notes (Signed)
Pharmacy Electrolyte Monitoring Consult:  Pharmacy consulted to assist in monitoring and replacing electrolytes in this 32 y.o. male admitted on 03/27/2017 with DKA.  Labs:  Sodium (mmol/L)  Date Value  03/27/2017 143   Potassium (mmol/L)  Date Value  03/27/2017 3.7   Magnesium (mg/dL)  Date Value  86/57/846902/12/2017 2.9 (H)   Phosphorus (mg/dL)  Date Value  62/95/284102/12/2017 9.9 (H)   Calcium (mg/dL)  Date Value  32/44/010202/12/2017 9.1   Albumin (g/dL)  Date Value  72/53/664402/12/2017 3.7    Plan: Patient initially with hyperkalemia now corrected to 3.7. Will order potassium 10mEq IV x 2 to maintain potassium ~ 4 while on insulin drip. Will continue to monitor with scheduled q4hr BMPs.   Pharmacy will continue to monitor and adjust per consult.   Simpson,Michael L 03/27/2017 7:49 PM

## 2017-03-27 NOTE — ED Triage Notes (Signed)
Pt arrived via ems from friends house with complaints of alerted mental status since 0200. Friends state patient has been vomiting since 0200.Upon arrival pt thrashing in bed and has a blood sugar reading of HIGH

## 2017-03-27 NOTE — H&P (Signed)
[facility] - [place] at [facility]   PATIENT NAME: Garrett [name]    MR#:  [id number]  DATE OF BIRTH:  1985-04-28  DATE OF ADMISSION:  03/27/2017  PRIMARY CARE PHYSICIAN: Patient, No Pcp Per   REQUESTING/REFERRING PHYSICIAN: Corliss Parish MD CHIEF COMPLAINT:  No chief complaint on file.   HISTORY OF PRESENT ILLNESS: Garrett [name]  is a 32 y.o. male with a known history of Type 1 diabetes who is presenting to the hospital with altered mental status and blood sugars being very high.  Apparently patient was at friend's house and they said that he will has been vomiting since 2 AM.  When he arrived in the ER his mental status was altered and was combative trying to get out of bed.  Patient was breathing rapidly.  He had to receive Versed in the ER.  Patient was noted to have severe hypoglycemia and findings consistent with DKA.   PAST MEDICAL HISTORY:   Past Medical History:  Diagnosis Date  . Compression fracture of C-spine (HCC)   . Type 1 diabetes (HCC)     PAST SURGICAL HISTORY: History reviewed. No pertinent surgical history.  SOCIAL HISTORY:  Social History   Tobacco Use  . Smoking status: Current Every Day Smoker    Packs/day: 0.50    Types: Cigarettes  . Smokeless tobacco: Never Used  Substance Use Topics  . Alcohol use: Yes    Comment: occasionally    FAMILY HISTORY: No family history on file.  DRUG ALLERGIES: No Known Allergies  REVIEW OF SYSTEMS:   CONSTITUTIONAL: Unable to provide MEDICATIONS AT HOME:  Prior to Admission medications   Medication Sig Start Date End Date Taking? Authorizing Provider  cephALEXin (KEFLEX) 500 MG capsule Take 1 capsule (500 mg total) by mouth 3 (three) times daily. 02/12/17   Tommi Rumps, PA-C  HYDROcodone-acetaminophen (NORCO/VICODIN) 5-325 MG tablet Take 1 tablet by mouth every 6 (six) hours as needed for moderate pain. 02/12/17   Tommi Rumps, PA-C  insulin aspart (NOVOLOG) 100 UNIT/ML injection  Inject into the skin 3 (three) times daily before meals. Uses sliding scale - max 14u at any dose    [provider]  insulin aspart protamine- aspart (NOVOLOG MIX 70/30) (70-30) 100 UNIT/ML injection Inject 30 Units into the skin 2 (two) times daily.    [provider]  sulfamethoxazole-trimethoprim (BACTRIM DS,SEPTRA DS) 800-160 MG tablet Take 1 tablet by mouth 2 (two) times daily. 02/12/17   Tommi Rumps, PA-C      PHYSICAL EXAMINATION:   VITAL SIGNS: Blood pressure (!) 90/58, pulse (!) 108, resp. rate (!) 23, height 5\' 8"  (1.727 m), weight 150 lb (68 kg), SpO2 98 %.  GENERAL:  32 y.o.-year-old patient lying in the bed critically ill EYES: Pupils equal, round, reactive to light and accommodation. No scleral icterus. Extraocular muscles intact.  HEENT: Head atraumatic, normocephalic. Oropharynx and nasopharynx clear.  NECK:  Supple, no jugular venous distention. No thyroid enlargement, no tenderness.  LUNGS: Normal breath sounds bilaterally, no wheezing, rales,rhonchi or crepitation. No use of accessory muscles of respiration.  CARDIOVASCULAR: S1, S2 normal. No murmurs, rubs, or gallops.  ABDOMEN: Soft, nontender, nondistended. Bowel sounds present. No organomegaly or mass.  EXTREMITIES: No pedal edema, cyanosis, or clubbing.  NEUROLOGIC: Confused  pSYCHIATRIC: Confused SKIN: No obvious rash, lesion, or ulcer.   LABORATORY PANEL:   CBC Recent Labs  Lab 03/27/17 1108  WBC 15.0*  HGB 12.7*  HCT 46.8  PLT 322  MCV 116.5*  MCH 31.5  MCHC 27.0*  RDW 16.5*  LYMPHSABS 1.7  MONOABS 0.9  EOSABS 0.0  BASOSABS 0.0   ------------------------------------------------------------------------------------------------------------------  Chemistries  Recent Labs  Lab 03/27/17 1108  NA 132*  K 6.3*  CL 92*  CO2 <7*  GLUCOSE 1,190*  BUN 36*  CREATININE 2.27*  CALCIUM 10.1  AST 35  ALT 36  ALKPHOS 233*  BILITOT 2.4*    ------------------------------------------------------------------------------------------------------------------ estimated creatinine clearance is 44.5 mL/min (A) (by C-G formula based on SCr of 2.27 mg/dL (H)). ------------------------------------------------------------------------------------------------------------------ No results for input(s): TSH, T4TOTAL, T3FREE, THYROIDAB in the last 72 hours.  Invalid input(s): FREET3   Coagulation profile No results for input(s): INR, PROTIME in the last 168 hours. ------------------------------------------------------------------------------------------------------------------- No results for input(s): DDIMER in the last 72 hours. -------------------------------------------------------------------------------------------------------------------  Cardiac Enzymes No results for input(s): CKMB, TROPONINI, MYOGLOBIN in the last 168 hours.  Invalid input(s): CK ------------------------------------------------------------------------------------------------------------------ Invalid input(s): POCBNP  ---------------------------------------------------------------------------------------------------------------  Urinalysis    Component Value Date/Time   COLORURINE STRAW (A) 03/27/2017 1127   APPEARANCEUR CLEAR (A) 03/27/2017 1127   LABSPEC 1.022 03/27/2017 1127   PHURINE 5.0 03/27/2017 1127   GLUCOSEU >=500 (A) 03/27/2017 1127   HGBUR SMALL (A) 03/27/2017 1127   BILIRUBINUR NEGATIVE 03/27/2017 1127   KETONESUR 80 (A) 03/27/2017 1127   PROTEINUR NEGATIVE 03/27/2017 1127   NITRITE NEGATIVE 03/27/2017 1127   LEUKOCYTESUR NEGATIVE 03/27/2017 1127     RADIOLOGY: No results found.  EKG: Orders placed or performed during the hospital encounter of 03/27/17  . EKG 12-Lead  . EKG 12-Lead  . EKG 12-Lead  . EKG 12-Lead    IMPRESSION AND PLAN: Patient is a 32 year old with acute encephalopathy severe hyperglycemia  1.  Acute  encephalopathy due to DKA and severe hyperglycemia Patient will start on Precedex drip  2.  DKA patient has been started on aggressive IV fluids and insulin drip Will follow his BMP closely discontinue insulin once Anion gap resolved Check a hemoglobin A1c  3.  Acute renal failure suspected due to severe dehydration IV fluids follow renal function  4.  Miscellaneous Lovenox for DVT prophylaxis   All the records are reviewed and case discussed with ED provider. Management plans discussed with the patient, family and they are in agreement.  CODE STATUS: Code Status History    This patient does not have a recorded code status. Please follow your organizational policy for patients in this situation.       TOTAL TIME TAKING CARE OF THIS PATIENT: 55minutes equal care time spent   Auburn BilberryShreyang Jaimon Bugaj M.D on 03/27/2017 at 12:17 PM  Between 7am to 6pm - Pager - [phone]  After 6pm go to [email or web address] - password [email or web address]  [facility]  Office  [phone]  CC: Primary care physician; Patient, No Pcp Per

## 2017-03-27 NOTE — ED Notes (Signed)
ICU primary RN not able to take report at this time.

## 2017-03-28 DIAGNOSIS — E0810 Diabetes mellitus due to underlying condition with ketoacidosis without coma: Secondary | ICD-10-CM

## 2017-03-28 LAB — GLUCOSE, CAPILLARY
GLUCOSE-CAPILLARY: 141 mg/dL — AB (ref 65–99)
GLUCOSE-CAPILLARY: 177 mg/dL — AB (ref 65–99)
GLUCOSE-CAPILLARY: 230 mg/dL — AB (ref 65–99)
Glucose-Capillary: 120 mg/dL — ABNORMAL HIGH (ref 65–99)
Glucose-Capillary: 226 mg/dL — ABNORMAL HIGH (ref 65–99)
Glucose-Capillary: 243 mg/dL — ABNORMAL HIGH (ref 65–99)
Glucose-Capillary: 290 mg/dL — ABNORMAL HIGH (ref 65–99)
Glucose-Capillary: 325 mg/dL — ABNORMAL HIGH (ref 65–99)

## 2017-03-28 LAB — BASIC METABOLIC PANEL
ANION GAP: 11 (ref 5–15)
BUN: 23 mg/dL — AB (ref 6–20)
CALCIUM: 8.3 mg/dL — AB (ref 8.9–10.3)
CO2: 25 mmol/L (ref 22–32)
CREATININE: 1.16 mg/dL (ref 0.61–1.24)
Chloride: 113 mmol/L — ABNORMAL HIGH (ref 101–111)
GFR calc Af Amer: >60 mL/min (ref >60)
GFR calc non Af Amer: >60 mL/min (ref >60)
GLUCOSE: 231 mg/dL — AB (ref 65–99)
Potassium: 4 mmol/L (ref 3.5–5.1)
Sodium: 149 mmol/L — ABNORMAL HIGH (ref 135–145)

## 2017-03-28 LAB — HEMOGLOBIN A1C
HEMOGLOBIN A1C: 12.6 % — AB (ref 4.8–5.6)
Mean Plasma Glucose: 314.92 mg/dL

## 2017-03-28 LAB — HIV ANTIBODY (ROUTINE TESTING W REFLEX): HIV Screen 4th Generation wRfx: NONREACTIVE

## 2017-03-28 LAB — CBC WITH DIFFERENTIAL/PLATELET
BASOS ABS: 0.1 10*3/uL (ref 0–0.1)
BASOS PCT: 1 %
EOS PCT: 0 %
Eosinophils Absolute: 0 10*3/uL (ref 0–0.7)
HEMATOCRIT: 34 % — AB (ref 40.0–52.0)
Hemoglobin: 11.3 g/dL — ABNORMAL LOW (ref 13.0–18.0)
Lymphocytes Relative: 13 %
Lymphs Abs: 2.1 10*3/uL (ref 1.0–3.6)
MCH: 31.7 pg (ref 26.0–34.0)
MCHC: 33.3 g/dL (ref 32.0–36.0)
MCV: 95.3 fL (ref 80.0–100.0)
MONO ABS: 1.7 10*3/uL — AB (ref 0.2–1.0)
MONOS PCT: 10 %
NEUTROS ABS: 12.3 10*3/uL — AB (ref 1.4–6.5)
Neutrophils Relative %: 76 %
PLATELETS: 283 10*3/uL (ref 150–440)
RBC: 3.57 MIL/uL — ABNORMAL LOW (ref 4.40–5.90)
RDW: 14.2 % (ref 11.5–14.5)
WBC: 16.2 10*3/uL — ABNORMAL HIGH (ref 3.8–10.6)

## 2017-03-28 LAB — PROCALCITONIN: Procalcitonin: 0.47 ng/mL

## 2017-03-28 MED ORDER — PHENOL 1.4 % MT LIQD
1.0000 | OROMUCOSAL | Status: DC | PRN
  Administered 2017-03-28 (×2): 1 via OROMUCOSAL
  Filled 2017-03-28: qty 177

## 2017-03-28 MED ORDER — SODIUM CHLORIDE 0.9 % IV BOLUS (SEPSIS)
1000.0000 mL | Freq: Once | INTRAVENOUS | Status: AC
  Administered 2017-03-28: 1000 mL via INTRAVENOUS

## 2017-03-28 MED ORDER — ACETAMINOPHEN 325 MG PO TABS
650.0000 mg | ORAL_TABLET | Freq: Four times a day (QID) | ORAL | Status: DC | PRN
  Administered 2017-03-28: 650 mg via ORAL
  Filled 2017-03-28: qty 2

## 2017-03-28 MED ORDER — SODIUM CHLORIDE 0.9 % IV SOLN
2.0000 g | Freq: Three times a day (TID) | INTRAVENOUS | Status: DC
  Administered 2017-03-28 – 2017-03-29 (×4): 2 g via INTRAVENOUS
  Filled 2017-03-28 (×7): qty 2

## 2017-03-28 MED ORDER — INSULIN ASPART 100 UNIT/ML ~~LOC~~ SOLN
0.0000 [IU] | Freq: Three times a day (TID) | SUBCUTANEOUS | Status: DC
  Administered 2017-03-28: 3 [IU] via SUBCUTANEOUS
  Administered 2017-03-28: 5 [IU] via SUBCUTANEOUS
  Administered 2017-03-28 – 2017-03-29 (×3): 2 [IU] via SUBCUTANEOUS
  Filled 2017-03-28 (×5): qty 1

## 2017-03-28 MED ORDER — INSULIN ASPART 100 UNIT/ML ~~LOC~~ SOLN
0.0000 [IU] | Freq: Every day | SUBCUTANEOUS | Status: DC
  Administered 2017-03-28: 4 [IU] via SUBCUTANEOUS
  Filled 2017-03-28: qty 1

## 2017-03-28 MED ORDER — INSULIN GLARGINE 100 UNIT/ML ~~LOC~~ SOLN
12.0000 [IU] | SUBCUTANEOUS | Status: DC
  Filled 2017-03-28 (×2): qty 0.12

## 2017-03-28 MED ORDER — LIVING WELL WITH DIABETES BOOK
Freq: Once | Status: AC
  Administered 2017-03-28: 14:00:00
  Filled 2017-03-28: qty 1

## 2017-03-28 NOTE — Progress Notes (Signed)
[employer] - [place] at [facility]   PATIENT NAME: Garrett [name]    MR#:  [id number]  DATE OF BIRTH:  1985-09-27  SUBJECTIVE:  CHIEF COMPLAINT:  No chief complaint on file. Lethargy noted, in discussion with intensivist-possible transfer to regular nursing floor today or tomorrow  REVIEW OF SYSTEMS:  CONSTITUTIONAL: No fever, fatigue or weakness.  EYES: No blurred or double vision.  EARS, NOSE, AND THROAT: No tinnitus or ear pain.  RESPIRATORY: No cough, shortness of breath, wheezing or hemoptysis.  CARDIOVASCULAR: No chest pain, orthopnea, edema.  GASTROINTESTINAL: No nausea, vomiting, diarrhea or abdominal pain.  GENITOURINARY: No dysuria, hematuria.  ENDOCRINE: No polyuria, nocturia,  HEMATOLOGY: No anemia, easy bruising or bleeding SKIN: No rash or lesion. MUSCULOSKELETAL: No joint pain or arthritis.   NEUROLOGIC: No tingling, numbness, weakness.  PSYCHIATRY: No anxiety or depression.   ROS  DRUG ALLERGIES:  No Known Allergies  VITALS:  Blood pressure 116/69, pulse 93, temperature (!) 97.5 F (36.4 C), resp. rate (!) 21, height 5\' 10"  (1.778 m), weight 58.8 kg (129 lb 10.1 oz), SpO2 99 %.  PHYSICAL EXAMINATION:  GENERAL:  32 y.o.-year-old patient lying in the bed with no acute distress.  EYES: Pupils equal, round, reactive to light and accommodation. No scleral icterus. Extraocular muscles intact.  HEENT: Head atraumatic, normocephalic. Oropharynx and nasopharynx clear.  NECK:  Supple, no jugular venous distention. No thyroid enlargement, no tenderness.  LUNGS: Normal breath sounds bilaterally, no wheezing, rales,rhonchi or crepitation. No use of accessory muscles of respiration.  CARDIOVASCULAR: S1, S2 normal. No murmurs, rubs, or gallops.  ABDOMEN: Soft, nontender, nondistended. Bowel sounds present. No organomegaly or mass.  EXTREMITIES: No pedal edema, cyanosis, or clubbing.  NEUROLOGIC: Cranial nerves II through XII are intact. Muscle strength 5/5  in all extremities. Sensation intact. Gait not checked.  PSYCHIATRIC: The patient is alert and oriented x 3.  SKIN: No obvious rash, lesion, or ulcer.   Physical Exam LABORATORY PANEL:   CBC Recent Labs  Lab 03/28/17 0348  WBC 16.2*  HGB 11.3*  HCT 34.0*  PLT 283   ------------------------------------------------------------------------------------------------------------------  Chemistries  Recent Labs  Lab 03/27/17 1108  03/28/17 0348  NA 132*   < > 149*  K 6.3*   < > 4.0  CL 92*   < > 113*  CO2 <7*   < > 25  GLUCOSE 1,190*   < > 231*  BUN 36*   < > 23*  CREATININE 2.27*   < > 1.16  CALCIUM 10.1   < > 8.3*  MG 2.9*  --   --   AST 35  --   --   ALT 36  --   --   ALKPHOS 233*  --   --   BILITOT 2.4*  --   --    < > = values in this interval not displayed.   ------------------------------------------------------------------------------------------------------------------  Cardiac Enzymes Recent Labs  Lab 03/27/17 1108  TROPONINI <0.03   ------------------------------------------------------------------------------------------------------------------  RADIOLOGY:  Dg Chest Portable 1 View  Result Date: 03/27/2017 CLINICAL DATA:  Diabetic ketoacidosis. EXAM: PORTABLE CHEST 1 VIEW COMPARISON:  None. FINDINGS: The heart size and mediastinal contours are within normal limits. Both lungs are clear. No pneumothorax or pleural effusion is noted. The visualized skeletal structures are unremarkable. IMPRESSION: No acute cardiopulmonary abnormality seen. Electronically Signed   By: Lupita RaiderJames  Green Jr, M.D.   On: 03/27/2017 13:56    ASSESSMENT AND PLAN:  Patient is a 32 year old with acute encephalopathy  severe hyperglycemia  1 acute encephalopathy Stable Secondary to acute diabetic ketoacidosis Continue aspiration/fall precautions, neurochecks per routine, and treatment of DKA  2 acute DKA Slowly improving Continue DKA protocol with IV fluids for  rehydration Possible transfer to the floor today or tomorrow  3 acute kidney injury Secondary to DKA/severe dehydration IV fluids for rehydration, avoid nephrotoxic agents, strict I&O monitoring, daily weights  4 chronic insulin-dependent diabetes mellitus Plan of care as stated above  All the records are reviewed and case discussed with Care Management/Social Workerr. Management plans discussed with the patient, family and they are in agreement.  CODE STATUS: full  TOTAL TIME TAKING CARE OF THIS PATIENT: 35 minutes.     POSSIBLE D/C IN 1-3 DAYS, DEPENDING ON CLINICAL CONDITION.   Evelena Asa Casy Tavano M.D on 03/28/2017   Between 7am to 6pm - Pager - [phone]  After 6pm go to [email or web address] - password [email or web address]  [facility] [place] [facility]  Office  [phone]  CC: Primary care physician; Patient, No Pcp Per  Note: This dictation was prepared with Dragon dictation along with smaller phrase technology. Any transcriptional errors that result from this process are unintentional.

## 2017-03-28 NOTE — Plan of Care (Addendum)
Patient transferred from ccu to unit. Report received from Maralyn SagoSarah, RN. Oriented to room, call bell, and staff. Bed in lowest position. Fall safety plan reviewed.Telemetry box verification with tele clerk- Box#: 14. Will continue to monitor.  Progressing Education: Knowledge of General Education information will improve 03/28/2017 1925 - Progressing by Rigoberto NoelMorales, Tilia Faso Y, RN Health Behavior/Discharge Planning: Ability to manage health-related needs will improve 03/28/2017 1925 - Progressing by Rigoberto NoelMorales, Arayah Krouse Y, RN Clinical Measurements: Ability to maintain clinical measurements within normal limits will improve 03/28/2017 1925 - Progressing by Rigoberto NoelMorales, Bryndan Bilyk Y, RN Will remain free from infection 03/28/2017 1925 - Progressing by Rigoberto NoelMorales, Amyria Komar Y, RN

## 2017-03-28 NOTE — Progress Notes (Signed)
ANTIBIOTIC CONSULT NOTE - INITIAL  Pharmacy Consult for Cefepime  Indication: sepsis  No Known Allergies  Patient Measurements: Height: 5\' 10"  (177.8 cm)(estimated) Weight: 129 lb 10.1 oz (58.8 kg) IBW/kg (Calculated) : 73 Adjusted Body Weight:   Vital Signs: Temp: 97.5 F (36.4 C) (02/12 0100) BP: 110/73 (02/12 0100) Pulse Rate: 81 (02/12 0100) Intake/Output from previous day: 02/11 0701 - 02/12 0700 In: 971.1 [I.V.:971.1] Out: 1275 [Urine:1275] Intake/Output from this shift: Total I/O In: 392.5 [I.V.:392.5] Out: 100 [Urine:100]  Labs: Recent Labs    03/27/17 1108 03/27/17 1324 03/27/17 1724 03/27/17 2229  WBC 15.0*  --   --   --   HGB 12.7*  --   --   --   PLT 322  --   --   --   CREATININE 2.27* 2.21* 1.79* 1.25*   Estimated Creatinine Clearance: 69.9 mL/min (A) (by C-G formula based on SCr of 1.25 mg/dL (H)). No results for input(s): VANCOTROUGH, VANCOPEAK, VANCORANDOM, GENTTROUGH, GENTPEAK, GENTRANDOM, TOBRATROUGH, TOBRAPEAK, TOBRARND, AMIKACINPEAK, AMIKACINTROU, AMIKACIN in the last 72 hours.   Microbiology: Recent Results (from the past 720 hour(s))  MRSA PCR Screening     Status: Abnormal   Collection Time: 03/27/17  1:50 PM  Result Value Ref Range Status   MRSA by PCR POSITIVE (A) NEGATIVE Final    Comment:        The GeneXpert MRSA Assay (FDA approved for NASAL specimens only), is one component of a comprehensive MRSA colonization surveillance program. It is not intended to diagnose MRSA infection nor to guide or monitor treatment for MRSA infections. RESULT CALLED TO, READ BACK BY AND VERIFIED WITH: Ian MalkinCHARLIE FLEETWOOD RN AT 1555 03/27/17. MSS Performed at [facility], [address] [place], [place] [address]     Medical History: Past Medical History:  Diagnosis Date  . Compression fracture of C-spine (HCC)   . Type 1 diabetes (HCC)     Medications:  Medications Prior to Admission  Medication Sig Dispense Refill Last Dose   . cephALEXin (KEFLEX) 500 MG capsule Take 1 capsule (500 mg total) by mouth 3 (three) times daily. 30 capsule 0   . HYDROcodone-acetaminophen (NORCO/VICODIN) 5-325 MG tablet Take 1 tablet by mouth every 6 (six) hours as needed for moderate pain. 15 tablet 0   . insulin aspart (NOVOLOG) 100 UNIT/ML injection Inject into the skin 3 (three) times daily before meals. Uses sliding scale - max 14u at any dose   02/12/2017 at AM  . insulin aspart protamine- aspart (NOVOLOG MIX 70/30) (70-30) 100 UNIT/ML injection Inject 30 Units into the skin 2 (two) times daily.   02/11/2017 at PM  . sulfamethoxazole-trimethoprim (BACTRIM DS,SEPTRA DS) 800-160 MG tablet Take 1 tablet by mouth 2 (two) times daily. 20 tablet 0    Assessment: CrCl = 48.8 ml/min  Goal of Therapy:  resolution of infection   Plan:  Expected duration 7 days with resolution of temperature and/or normalization of WBC   Cefepime 2 gm IV Q12H ordered to start on 2/11 @ 22:00.   02/12 @ 0300 renal function improving Scr 2.21 >> 1.25, CrCl 69.9 ml/min currently, will readjust cefepime to 2g IV q8h.  Thomasene Rippleavid Rya Rausch, PharmD, BCPS Clinical Pharmacist 03/28/2017

## 2017-03-28 NOTE — Progress Notes (Addendum)
Inpatient Diabetes Program Recommendations  AACE/ADA: New Consensus Statement on Inpatient Glycemic Control (2015)  Target Ranges:  Prepandial:   less than 140 mg/dL      Peak postprandial:   less than 180 mg/dL (1-2 hours)      Critically ill patients:  140 - 180 mg/dL  Results for Garrett [name], Garrett [name] (MRN [id number]) as of 03/28/2017 09:55  Ref. Range 03/27/2017 21:38 03/27/2017 22:43 03/27/2017 23:34 03/28/2017 00:37 03/28/2017 01:36 03/28/2017 03:58 03/28/2017 07:28  Glucose-Capillary Latest Ref Range: 65 - 99 mg/dL 409 (H) 98 811 (H) 914 (H) 141 (H) 226 (H) 177 (H)   Results for Garrett [name], Garrett [name] (MRN [id number]) as of 03/28/2017 09:55  Ref. Range 03/27/2017 11:08 03/27/2017 13:24 03/27/2017 17:24 03/27/2017 22:29 03/28/2017 03:48  Beta-Hydroxybutyric Acid Latest Ref Range: 0.05 - 0.27 mmol/L >8.00 (H)      Glucose Latest Ref Range: 65 - 99 mg/dL 0,865 (HH) 7,846 (HH) 962 (HH) 108 (H) 231 (H)  Results for Garrett [name], Garrett [name] (MRN [id number]) as of 03/28/2017 14:30  Ref. Range 03/28/2017 03:48  Hemoglobin A1C Latest Ref Range: 4.8 - 5.6 % 12.6 (H)   Review of Glycemic Control  Diabetes history: DM1 (makes no insulin; requires basal, correction, and meal coverage insulin) Outpatient Diabetes medications: Novolog 70/30 30 units BID, Novolog TID with meals (max dose of 14 units at a time) Current orders for Inpatient glycemic control: Lantus 8 units Q24H, Novolog 0-9 units TID with meals, Novolog 0-5 units QHS  Inpatient Diabetes Program Recommendations: Insulin - Basal: Noted patient received Lantus 8 units at 00:31 on 03/28/17 at time of transition off IV insulin to SQ insulin. Please consider increasing Lantus to 12 units QHS. Insulin - Meal Coverage: Please consider ordering Novolog 3 units TID with meals for meal coverage if patient eats at least 50% of meals. HgbA1C: Please consider ordering an A1C to evaluate glycemic control over the past 2-3 months.  NOTE: In reviewing chart, noted patient has Type 1 DM and  admitted with DKA on 03/27/17. Went by to talk with patient on 03/27/17 but patient was somnolent and unable to engage in conversation. Noted patient has no insurance or PCP. Noted order for Case Manager already ordered. Will plan to see patient today.   Addendum 03/28/17@13 :00-Spoke with patient about diabetes and home regimen for diabetes control. Patient reports that he moved to [place] about 1 1/2 years ago from [facility]. Patient states that he has not established care with a local health care provider since moving to [facility]. In talking with patient he reports that he has been buying his insulin from [facility] (Novolin 70/30 and Regular) over the counter and he reports that he does not have much insulin left at home. Patient reports that he lost his job about 1 month ago and he is unable to even afford $25 per vial for the insulins from [place]. Patient reports that he has not been checking his glucose lately because he ran out of test strips and he can not afford to purchase more. Patient has been seen by Case Management and has been referred to [facility]. Patient has application at bedside for [facility]. Asked patient to go ahead and complete the application so he can take it directly to the [facility] when he is discharged to get prescriptions filled. Patient reports that when he was able to check his glucose it had been running in the 200-300's mg/dl and higher but mostly over 300 mg/dl. Patient also reports frequent hypoglycemia.  Inquired about prior A1C and patient reports that his last A1C value done in [place] was 12%. Informed patient A1C had been ordered but results were not in the chart at time of discussion so encouraged patient to ask about A1C results.  Discussed glucose and A1C goals. Discussed importance of checking CBGs and maintaining good CBG control to prevent long-term and short-term complications. Explained how hyperglycemia leads to damage within blood vessels  which lead to the common complications seen with uncontrolled diabetes. Stressed to the patient the importance of improving glycemic control to prevent further complications from uncontrolled diabetes. Was very frank with patient and stressed that he was young and if he did not get DM controlled he could end up blind, on dialysis, losing limbs, having a heart attack or stroke, or worse.  Patient reports he had no idea DM could be so serious. Discussed impact of nutrition, exercise, stress, sickness, and medications on diabetes control.In talking with patient, there is a large gap in knowledge about DM and importance of DM control. Patient requested as much information as possible on DM, diet, and what he can do to help himself get DM better controlled. Patient reports that he would prefer to be discharged on 70/30 and Novolog insulin if possible.  Discussed 70/30 and Regular insulin in detail and how they should be taken. Patient reports he takes 70/30 30 units BID and uses Regular when needed if his glucose is high. Patient endorses symptoms of hypoglycemia this morning with glucose of 178 mg/dl. Discussed that his body is likely used to glucose values much higher and when his glucose is within more normal values he may be having symptoms of hypoglycemia. Explained that the body can 'reset' itself to more normal glucose values over time if he can get DM better controlled.  Encouraged patient to get a new glucometer and to check his glucose more often 3-4 times per day (before meals and at bedtime) and to keep a log book of glucose readings and insulin taken which he will need to take to doctor appointments. Explained how the doctor he follows up with can use the log book to continue to make insulin adjustments if needed. Also encouraged patient to find a local Endocrinologist if he is able to get insurance or other appropriate resources.  Patient appreciative of information discussed and verbalized understanding.  Patient states that he has no further questions at this time related to diabetes. Ordered Living Well with Diabetes book and RD consult. [facility] to inquire about insulins on hand and was informed they have Novolog 70/30 insulin and Humalog insulin. At time of discharge, please provide prescriptions for NOVOLOG 70/30 (vials), Humalog (vials), insulin syringes, glucometer, and testing supplies. Called unit and spoke with Maralyn SagoSarah, RN and asked that she let patient know that [facility] has NOVOLOG 70/30 and Humalog so that is what will be requested to be prescribed at discharge; also to let patient know of A1C results which is 12.6%.  Thanks, Lillard PennerMarie Venisa Frampton, RN, MSN, CDE Diabetes Coordinator Inpatient Diabetes Program [phone] (Team Pager from 8am to 5pm)

## 2017-03-28 NOTE — Consult Note (Signed)
   Name: Garrett [name] MRN: [id number] DOB: [birth year]     CONSULTATION DATE: 03/27/2017  REFERRING MD :  Maryanna ShapeForbath  CHIEF COMPLAINT:  agitation     HISTORY OF PRESENT ILLNESS:  Drowsy Weaned off bicarb Weaned off precedex VS stable     REVIEW OF SYSTEMS:   Unobtainable due to lethargy  ALL OTHER ROS ARE NEGATIVE    VITAL SIGNS: Temp:  [87.8 F (31 C)-100.4 F (38 C)] 97.3 F (36.3 C) (02/12 0700) Pulse Rate:  [68-108] 78 (02/12 0700) Resp:  [15-39] 17 (02/12 0700) BP: (88-110)/(52-73) 88/56 (02/12 0700) SpO2:  [96 %-100 %] 96 % (02/12 0700) Weight:  [129 lb 10.1 oz (58.8 kg)-150 lb (68 kg)] 129 lb 10.1 oz (58.8 kg) (02/11 1328)  Physical Examination:  GENERAL:stuperous, drowsy HEAD: Normocephalic, atraumatic.  EYES: Pupils equal, round, reactive to light.  No scleral icterus.  MOUTH: Moist mucosal membrane. NECK: Supple. No thyromegaly. No nodules. No JVD.  PULMONARY: +rhonchi, +wheezing CARDIOVASCULAR: S1 and S2. Regular rate and rhythm. No murmurs, rubs, or gallops.  GASTROINTESTINAL: Soft, nontender, -distended. No masses. Positive bowel sounds. No hepatosplenomegaly.  MUSCULOSKELETAL: No swelling, clubbing, or edema.  NEUROLOGIC: lethargic SKIN:intact,warm,dry       Recent Labs  Lab 03/27/17 1724 03/27/17 2229 03/28/17 0348  NA 143 148* 149*  K 3.7 3.6 4.0  CL 108 112* 113*  CO2 9* 22 25  BUN 32* 25* 23*  CREATININE 1.79* 1.25* 1.16  GLUCOSE 570* 108* 231*   Recent Labs  Lab 03/27/17 1108 03/28/17 0348  HGB 12.7* 11.3*  HCT 46.8 34.0*  WBC 15.0* 16.2*  PLT 322 283    ASSESSMENT / PLAN: 32 yo AAM with severe DKA wth severe encephalopathy from severe acidosis and acute renal failure-slowly resolving  1.Insulin infusion Protocol completed 2.IVF's 3.bicarb off 4.follow chem 7 5.precedex stopped  OK to transfer to gen med floor in next 24 hrs  Stacyann Mcconaughy Santiago Gladavid Alithea Lapage, M.D.  [facility]  Medical Director  [facility] Medical Director [facility] Card[facility]

## 2017-03-28 NOTE — Progress Notes (Signed)
Pt has been alert and oriented since early this morning. Eating with excellent appetite, adequate urine output via condom cath. Pt has had no complaints of pain, VS WNL.

## 2017-03-28 NOTE — Care Management Note (Signed)
Case Management Note  Patient Details  Name: Garrett [name] MRN: [id number] Date of Birth: 1985/02/16  Subjective/Objective:                   RNCM met with patient after noting patient does not have a PCP or health insurance with diabetes.  He has been obtaining his insulin from [facility] (~$50 for two vials per patient) however he cannot afford test strips for his glucometer.  He states he has moved here with his girlfriend from [place].  He can prove residence here in [place].  He would appreciate assistance with medications/diabetic supplies and PCP. He says his girlfriend has insurance but no PCP.  I provided information about [facility] which is near [employer].   Action/Plan:   [facility]- they can supple him with glucometer and supplies. Referral to Medication management.  Expected Discharge Date:                  Expected Discharge Plan:     In-House Referral:     Discharge planning Services  CM Consult, [facility], Medication Assistance  Post Acute Care Choice:    Choice offered to:  Patient  DME Arranged:    DME Agency:     HH Arranged:    [place] Agency:     Status of Service:  In process, will continue to follow  If discussed at Long Length of Stay Meetings, dates discussed:    Additional Comments:  Marshell Garfinkel, RN 03/28/2017, 10:53 AM

## 2017-03-29 LAB — BASIC METABOLIC PANEL
Anion gap: 10 (ref 5–15)
BUN: 13 mg/dL (ref 6–20)
CHLORIDE: 102 mmol/L (ref 101–111)
CO2: 23 mmol/L (ref 22–32)
Calcium: 7.9 mg/dL — ABNORMAL LOW (ref 8.9–10.3)
Creatinine, Ser: 0.81 mg/dL (ref 0.61–1.24)
GFR calc Af Amer: >60 mL/min (ref >60)
GFR calc non Af Amer: >60 mL/min (ref >60)
Glucose, Bld: 211 mg/dL — ABNORMAL HIGH (ref 65–99)
POTASSIUM: 3.1 mmol/L — AB (ref 3.5–5.1)
Sodium: 135 mmol/L (ref 135–145)

## 2017-03-29 LAB — GLUCOSE, CAPILLARY
Glucose-Capillary: 151 mg/dL — ABNORMAL HIGH (ref 65–99)
Glucose-Capillary: 168 mg/dL — ABNORMAL HIGH (ref 65–99)

## 2017-03-29 LAB — URINE CULTURE: Culture: NO GROWTH

## 2017-03-29 LAB — PROCALCITONIN: PROCALCITONIN: 0.22 ng/mL

## 2017-03-29 MED ORDER — INSULIN ASPART PROT & ASPART (70-30 MIX) 100 UNIT/ML ~~LOC~~ SUSP: 30.0000 [IU] | Freq: Two times a day (BID) | SUBCUTANEOUS | 11 refills | Status: DC

## 2017-03-29 MED ORDER — CEPHALEXIN 500 MG PO CAPS: 500.0000 mg | ORAL_CAPSULE | Freq: Two times a day (BID) | ORAL | 0 refills | Status: DC

## 2017-03-29 MED ORDER — POTASSIUM CHLORIDE CRYS ER 20 MEQ PO TBCR
40.0000 meq | EXTENDED_RELEASE_TABLET | Freq: Once | ORAL | Status: AC
  Administered 2017-03-29: 40 meq via ORAL
  Filled 2017-03-29: qty 2

## 2017-03-29 MED ORDER — INSULIN ASPART 100 UNIT/ML ~~LOC~~ SOLN: 5.0000 [IU] | Freq: Three times a day (TID) | SUBCUTANEOUS | 11 refills | Status: DC

## 2017-03-29 NOTE — Progress Notes (Signed)
Pharmacy consulted for electrolyte replacement protocol: Pharmacy consulted to assist in monitoring and replacing electrolytes in this 32 y.o. male admitted on 03/27/2017 with DKA.    Goal of therapy: Electrolytes within normal limits:  K 3.5 - 5.1 Corrected Ca 8.9 - 10.3 Phos 2.5 - 4.6 Mg 1.7 - 2.4   Assessment: Lab Results  Component Value Date   CREATININE 0.81 03/29/2017   BUN 13 03/29/2017   NA 135 03/29/2017   K 3.1 (L) 03/29/2017   CL 102 03/29/2017   CO2 23 03/29/2017    Plan: Hospitalist has ordered potassium chloride 40meq po x 1 dose for K 3.1, will follow up with patient tomorrow with AM labs. Will also check magnesium.   Luan PullingGarrett Vanden Fawaz, PharmD, MBA, Liz ClaiborneBCGP Clinical Pharmacist [facility]

## 2017-03-29 NOTE — Care Management (Signed)
Patient did not complete applications for [facility] and [facility] at last discharge.  He is now a resident of [place]. CM provided patient with application requirements and release of information consents for his applications.  Reviewed step by step what must be completed and how.  An appointment was [facility] as a bridge while patient completes the applications.  He verbalizes that he is now going to take his diabetes more serious.  He verbalizes understanding that he needs to proceed across the street at discharge to pick up his discharge meds

## 2017-03-29 NOTE — Discharge Instructions (Signed)

## 2017-03-29 NOTE — Plan of Care (Signed)
  RD consulted for nutrition education regarding diabetes.   Lab Results  Component Value Date   HGBA1C 12.6 (H) 03/28/2017   Met with patient at bedside. He reports he has had diabetes for about 13 years now. He wants to start taking his diabetes seriously now. He met with a dietitian many years ago but cannot remember what he learned. He typically eats 2-3 meals per day. He enjoys pizza, burgers, fries, chips. He was drinking diet soda at time of education. He reports he has a very good appetite that is unchanged from baseline. He denies any weight loss. Patient has a small body habitus, but no wasting apparent on exam.  RD provided "Ready, Set, Start Counting" handout from the Academy of Nutrition and Dietetics. Discussed different food groups and their effects on blood sugar, emphasizing carbohydrate-containing foods. Provided list of carbohydrates and recommended serving sizes of common foods.  Discussed importance of controlled and consistent carbohydrate intake throughout the day. Provided examples of ways to balance meals/snacks and encouraged intake of high-fiber, whole grain complex carbohydrates. Teach back method used.  Expect fair compliance. Patient would benefit from outpatient nutrition counseling but he does not have any insurance at this time.  Body mass index is 18.6 kg/m. Pt meets criteria for normal weight based on current BMI.  Current diet order is Carbohydrate Modified, patient is consuming approximately 100% of meals at this time. Labs and medications reviewed. No further nutrition interventions warranted at this time. RD contact information provided. If additional nutrition issues arise, please re-consult RD.  Willey Blade, MS, [place], LDN Office: [phone] Pager: [phone] After Hours/Weekend Pager: [phone]

## 2017-03-29 NOTE — Progress Notes (Signed)
Garrett [name] to be D/C'd Home per MD order.  Discussed prescriptions and follow up appointments with the patient. Pt verbalized understanding.  Allergies as of 03/29/2017   No Known Allergies     Medication List    STOP taking these medications   HYDROcodone-acetaminophen 5-325 MG tablet Commonly known as:  NORCO/VICODIN   insulin NPH-regular Human (70-30) 100 UNIT/ML injection Commonly known as:  NOVOLIN 70/30   sulfamethoxazole-trimethoprim 800-160 MG tablet Commonly known as:  BACTRIM DS,SEPTRA DS     TAKE these medications   cephALEXin 500 MG capsule Commonly known as:  KEFLEX Take 1 capsule (500 mg total) by mouth 2 (two) times daily. What changed:  when to take this   insulin aspart 100 UNIT/ML injection Commonly known as:  novoLOG Inject 5 Units into the skin 3 (three) times daily with meals. Uses sliding scale - max 14u at any dose What changed:    how much to take  when to take this  Another medication with the same name was removed. Continue taking this medication, and follow the directions you see here.   insulin aspart protamine- aspart (70-30) 100 UNIT/ML injection Commonly known as:  NOVOLOG MIX 70/30 Inject 0.3 mLs (30 Units total) into the skin 2 (two) times daily.       Vitals:   03/29/17 0744 03/29/17 1202  BP: 123/75 (!) 151/100  Pulse:  86  Resp: 18   Temp: 98.2 F (36.8 C) 98 F (36.7 C)  SpO2: 100% 100%    Tele box removed and returned.Skin clean, dry and intact without evidence of skin break down, no evidence of skin tears noted. IV catheter discontinued intact. Site without signs and symptoms of complications. Dressing and pressure applied. Pt denies pain at this time. No complaints noted.  An After Visit Summary was printed and given to the patient. Patient escorted via WC, and D/C home via private auto.  Rigoberto NoelErica Y Bita Cartwright

## 2017-03-29 NOTE — Progress Notes (Addendum)
Inpatient Diabetes Program Recommendations  AACE/ADA: New Consensus Statement on Inpatient Glycemic Control (2015)  Target Ranges:  Prepandial:   less than 140 mg/dL      [place] postprandial:   less than 180 mg/dL (1-2 hours)      Critically ill patients:  140 - 180 mg/dL   Results for Garrett [name], Ashawn (MRN [id number]) as of 03/29/2017 09:26  Ref. Range 03/28/2017 07:28 03/28/2017 11:30 03/28/2017 12:08 03/28/2017 15:47 03/28/2017 20:13 03/29/2017 07:40  Glucose-Capillary Latest Ref Range: 65 - 99 mg/dL 409177 (H) 811230 (H) 914243 (H) 290 (H) 325 (H) 151 (H)  Results for Garrett [name], Zuriel (MRN [id number]) as of 03/29/2017 09:26  Ref. Range 03/28/2017 03:48  Hemoglobin A1C Latest Ref Range: 4.8 - 5.6 % 12.6 (H)   Review of Glycemic Control  Diabetes history: DM1 (makes no insulin; requires basal, correction, and meal coverage insulin) Outpatient Diabetes medications: Novolog 70/30 30 units BID, Novolog TID with meals (max dose of 14 units at a time) Current orders for Inpatient glycemic control: Lantus 12 units Q24H, Novolog 0-9 units TID with meals, Novolog 0-5 units QHS  Inpatient Diabetes Program Recommendations: Insulin - Meal Coverage: Post prandial glucose is consistently elevated as patient has Type 1 diabetes and requires insulin to cover carbohydrates. Please consider ordering Novolog 5 units TID with meals for meal coverage if patient eats at least 50% of meals. HgbA1C: A1C 12.6% on 03/28/17 indicating an average glucose of 315 mg/dl over the past 2-3 months. Patient needs to establish care with local HCP. Patient [facility] been [facility] and get medications from [facility].   Thanks, Unique PennerMarie Brogan England, RN, MSN, CDE Diabetes Coordinator Inpatient Diabetes Program [phone] (Team Pager from 8am to 5pm)

## 2017-04-01 LAB — CULTURE, BLOOD (ROUTINE X 2)
Culture: NO GROWTH
Culture: NO GROWTH
Special Requests: ADEQUATE

## 2017-04-02 NOTE — Discharge Summary (Signed)
[facility] -  at [facility]   PATIENT NAME: Garrett HeftyOrlando [name]    MR#:  [id number]  DATE OF BIRTH:  05-18-1985  DATE OF ADMISSION:  03/27/2017   ADMITTING PHYSICIAN: Auburn BilberryShreyang Patel, MD  DATE OF DISCHARGE: 03/29/2017  2:35 PM  PRIMARY CARE PHYSICIAN: Patient, No Pcp Per   ADMISSION DIAGNOSIS:  Diabetic ketoacidosis with coma associated with type 2 diabetes mellitus (HCC) [E11.11] DISCHARGE DIAGNOSIS:  Active Problems:   DKA (diabetic ketoacidoses) (HCC)  SECONDARY DIAGNOSIS:   Past Medical History:  Diagnosis Date  . Compression fracture of C-spine (HCC)   . Type 1 diabetes [facility](HCC)    HOSPITAL COURSE:  Patient is a 32 year old with acute encephalopathy severe hyperglycemia  1 acute metabolic encephalopathy: now resolved Secondary to acute diabetic ketoacidosis  2 acuteDKA Resolved, treated per DKA protocol  3 Acute kidney injury Secondary to DKA/severe dehydration Resolved with hydration  4 chronic insulin-dependent diabetes mellitus DISCHARGE CONDITIONS:  stable CONSULTS OBTAINED:   DRUG ALLERGIES:  No Known Allergies DISCHARGE MEDICATIONS:   Allergies as of 03/29/2017   No Known Allergies     Medication List    STOP taking these medications   HYDROcodone-acetaminophen 5-325 MG tablet Commonly known as:  NORCO/VICODIN   insulin NPH-regular Human (70-30) 100 UNIT/ML injection Commonly known as:  NOVOLIN 70/30   sulfamethoxazole-trimethoprim 800-160 MG tablet Commonly known as:  BACTRIM DS,SEPTRA DS     TAKE these medications   cephALEXin 500 MG capsule Commonly known as:  KEFLEX Take 1 capsule (500 mg total) by mouth 2 (two) times daily. What changed:  when to take this   insulin aspart 100 UNIT/ML injection Commonly known as:  novoLOG Inject 5 Units into the skin 3 (three) times daily with meals. Uses sliding scale - max 14u at any dose What changed:    how much to take  when to take this  Another medication with  the same name was removed. Continue taking this medication, and follow the directions you see here.   insulin aspart protamine- aspart (70-30) 100 UNIT/ML injection Commonly known as:  NOVOLOG MIX 70/30 Inject 0.3 mLs (30 Units total) into the skin 2 (two) times daily.        DISCHARGE INSTRUCTIONS:   DIET:  Regular diet DISCHARGE CONDITION:  Good ACTIVITY:  Activity as tolerated OXYGEN:  Home Oxygen: No.  Oxygen Delivery: room air DISCHARGE LOCATION:  home   If you experience worsening of your admission symptoms, develop shortness of breath, life threatening emergency, suicidal or homicidal thoughts you must seek medical attention immediately by calling 911 or calling your MD immediately  if symptoms less severe.  You Must read complete instructions/literature along with all the possible adverse reactions/side effects for all the Medicines you take and that have been prescribed to you. Take any new Medicines after you have completely understood and accpet all the possible adverse reactions/side effects.   Please note  You were cared for by a hospitalist during your hospital stay. If you have any questions about your discharge medications or the care you received while you were in the hospital after you are discharged, you can call the unit and asked to speak with the hospitalist on call if the hospitalist that took care of you is not available. Once you are discharged, your primary care physician will handle any further medical issues. Please note that NO REFILLS for any discharge medications will be authorized once you are discharged, as it is imperative that you return  to your primary care physician (or establish a relationship with a primary care physician if you do not have one) for your aftercare needs so that they can reassess your need for medications and monitor your lab values.    On the day of Discharge:  VITAL SIGNS:  Blood pressure (!) 151/90, pulse 86, temperature 98  F (36.7 C), temperature source Oral, resp. rate 18, height 5\' 10"  (1.778 m), weight 58.8 kg (129 lb 10.1 oz), SpO2 100 %. PHYSICAL EXAMINATION:  GENERAL:  32 y.o.-year-old patient lying in the bed with no acute distress.  EYES: Pupils equal, round, reactive to light and accommodation. No scleral icterus. Extraocular muscles intact.  HEENT: Head atraumatic, normocephalic. Oropharynx and nasopharynx clear.  NECK:  Supple, no jugular venous distention. No thyroid enlargement, no tenderness.  LUNGS: Normal breath sounds bilaterally, no wheezing, rales,rhonchi or crepitation. No use of accessory muscles of respiration.  CARDIOVASCULAR: S1, S2 normal. No murmurs, rubs, or gallops.  ABDOMEN: Soft, non-tender, non-distended. Bowel sounds present. No organomegaly or mass.  EXTREMITIES: No pedal edema, cyanosis, or clubbing.  NEUROLOGIC: Cranial nerves II through XII are intact. Muscle strength 5/5 in all extremities. Sensation intact. Gait not checked.  PSYCHIATRIC: The patient is alert and oriented x 3.  SKIN: No obvious rash, lesion, or ulcer.  DATA REVIEW:   CBC Recent Labs  Lab 03/28/17 0348  WBC 16.2*  HGB 11.3*  HCT 34.0*  PLT 283    Chemistries  Recent Labs  Lab 03/27/17 1108  03/29/17 0608  NA 132*   < > 135  K 6.3*   < > 3.1*  CL 92*   < > 102  CO2 <7*   < > 23  GLUCOSE 1,190*   < > 211*  BUN 36*   < > 13  CREATININE 2.27*   < > 0.81  CALCIUM 10.1   < > 7.9*  MG 2.9*  --   --   AST 35  --   --   ALT 36  --   --   ALKPHOS 233*  --   --   BILITOT 2.4*  --   --    < > = values in this interval not displayed.     Follow-up Information    Center, [facility]. Schedule an appointment as soon as possible for a visit on 04/05/2017.   Specialty:  General Practice Why:  Appointment Time: 10:30am, Please also call by Tuesday to confirm, and bring all forms of ID and Insurance. Contact information: [address] [place] [address] [phone]            Management plans discussed with the patient, family and they are in agreement.  CODE STATUS: Prior   TOTAL TIME TAKING CARE OF THIS PATIENT: 45 minutes.    Delfino Lovett M.D on 04/02/2017 at 2:09 PM  Between 7am to 6pm - Pager - [phone]  After 6pm go to [email or web address] - Social research officer, government  [facility] [place] [facility]  Office  [phone]  CC: Primary care physician; Patient, No Pcp Per   Note: This dictation was prepared with Dragon dictation along with smaller phrase technology. Any transcriptional errors that result from this process are unintentional.

## 2017-09-14 ENCOUNTER — Encounter: Payer: Self-pay | Admitting: Emergency Medicine

## 2017-09-14 ENCOUNTER — Other Ambulatory Visit: Payer: Self-pay

## 2017-09-14 ENCOUNTER — Emergency Department
Admission: EM | Admit: 2017-09-14 | Discharge: 2017-09-14 | Disposition: A | Discharge status: Home / Self Care | Payer: Self-pay | Attending: Emergency Medicine | Admitting: Emergency Medicine

## 2017-09-14 DIAGNOSIS — Z794 Long term (current) use of insulin: Secondary | ICD-10-CM | POA: Insufficient documentation

## 2017-09-14 DIAGNOSIS — R739 Hyperglycemia, unspecified: Secondary | ICD-10-CM

## 2017-09-14 DIAGNOSIS — E1065 Type 1 diabetes mellitus with hyperglycemia: Secondary | ICD-10-CM | POA: Insufficient documentation

## 2017-09-14 DIAGNOSIS — F17210 Nicotine dependence, cigarettes, uncomplicated: Secondary | ICD-10-CM | POA: Insufficient documentation

## 2017-09-14 LAB — URINALYSIS, COMPLETE (UACMP) WITH MICROSCOPIC
BACTERIA UA: NONE SEEN
Bilirubin Urine: NEGATIVE
Glucose, UA: >=500 mg/dL — AB
Hgb urine dipstick: NEGATIVE
KETONES UR: 5 mg/dL — AB
LEUKOCYTES UA: NEGATIVE
Nitrite: NEGATIVE
PROTEIN: NEGATIVE mg/dL
Specific Gravity, Urine: 1.024 (ref 1.005–1.030)
Squamous Epithelial / LPF: NONE SEEN (ref 0–5)
pH: 6 (ref 5.0–8.0)

## 2017-09-14 LAB — CBC
HEMATOCRIT: 41.2 % (ref 40.0–52.0)
Hemoglobin: 13.6 g/dL (ref 13.0–18.0)
MCH: 32.2 pg (ref 26.0–34.0)
MCHC: 32.9 g/dL (ref 32.0–36.0)
MCV: 98 fL (ref 80.0–100.0)
Platelets: 228 10*3/uL (ref 150–440)
RBC: 4.21 MIL/uL — AB (ref 4.40–5.90)
RDW: 13.2 % (ref 11.5–14.5)
WBC: 6 10*3/uL (ref 3.8–10.6)

## 2017-09-14 LAB — GLUCOSE, CAPILLARY
GLUCOSE-CAPILLARY: 475 mg/dL — AB (ref 70–99)
Glucose-Capillary: 236 mg/dL — ABNORMAL HIGH (ref 70–99)

## 2017-09-14 LAB — BLOOD GAS, VENOUS
Acid-Base Excess: 0.2 mmol/L (ref 0.0–2.0)
BICARBONATE: 27.3 mmol/L (ref 20.0–28.0)
O2 Saturation: UNDETERMINED %
PCO2 VEN: 53 mmHg (ref 44.0–60.0)
PH VEN: 7.32 (ref 7.250–7.430)
Patient temperature: 37

## 2017-09-14 LAB — BASIC METABOLIC PANEL
Anion gap: 6 (ref 5–15)
BUN: 14 mg/dL (ref 6–20)
CHLORIDE: 95 mmol/L — AB (ref 98–111)
CO2: 28 mmol/L (ref 22–32)
Calcium: 8.4 mg/dL — ABNORMAL LOW (ref 8.9–10.3)
Creatinine, Ser: 1.06 mg/dL (ref 0.61–1.24)
GFR calc non Af Amer: >60 mL/min (ref >60)
Glucose, Bld: 858 mg/dL (ref 70–99)
POTASSIUM: 4.9 mmol/L (ref 3.5–5.1)
SODIUM: 129 mmol/L — AB (ref 135–145)

## 2017-09-14 LAB — MAGNESIUM: Magnesium: 2 mg/dL (ref 1.7–2.4)

## 2017-09-14 MED ORDER — SODIUM CHLORIDE 0.9 % IV BOLUS
1000.0000 mL | Freq: Once | INTRAVENOUS | Status: AC
  Administered 2017-09-14: 1000 mL via INTRAVENOUS

## 2017-09-14 MED ORDER — INSULIN ASPART 100 UNIT/ML ~~LOC~~ SOLN
12.0000 [IU] | Freq: Once | SUBCUTANEOUS | Status: AC
  Administered 2017-09-14: 12 [IU] via INTRAVENOUS
  Filled 2017-09-14: qty 1

## 2017-09-14 NOTE — ED Provider Notes (Addendum)
[facility] Emergency Department Provider Note  ____________________________________________   I have reviewed the triage vital signs and the nursing notes. Where available I have reviewed prior notes and, if possible and indicated, outside hospital notes.    HISTORY Chief Complaint Hyperglycemia    HPI Garrett [name] is a 32 y.o. male with a history of poorly controlled diabetes, states he is compliant with his diabetes medications although sometimes he is not, he did take his insulin last night, as well as this morning.  He states that he went to a restaurant and had multiple large glasses of sugary lemonade, and his sugar went up.  Patient was arrested, not presumably because of the lemonade, he was found to have high glucose, and he was brought in here by the jail for medical clearance to make sure he is not in DKA.  He has no complaints.  He has some recent stress obviously from being placed in handcuffs but otherwise he has no complaints.  He does not have any vomiting diarrhea fever headache stiff neck recent illness chest pain shortness of breath or anything else.  He states is not unusual for surgery are quite high when he is noncompliant and he states he had a lot of sugary food today, his record high glucose in the last 5 months rest is over thousand.   Past Medical History:  Diagnosis Date  . Compression fracture of C-spine (HCC)   . Type 1 diabetes [facility])     Patient Active Problem List   Diagnosis Date Noted  . DKA (diabetic ketoacidoses) (HCC) 03/27/2017    History reviewed. No pertinent surgical history.  Prior to Admission medications   Medication Sig Start Date End Date Taking? Authorizing Provider  cephALEXin (KEFLEX) 500 MG capsule Take 1 capsule (500 mg total) by mouth 2 (two) times daily. 03/29/17   Delfino Lovett, MD  insulin aspart (NOVOLOG) 100 UNIT/ML injection Inject 5 Units into the skin 3 (three) times daily with meals. Uses sliding  scale - max 14u at any dose 03/29/17   Delfino Lovett, MD  insulin aspart protamine- aspart (NOVOLOG MIX 70/30) (70-30) 100 UNIT/ML injection Inject 0.3 mLs (30 Units total) into the skin 2 (two) times daily. 03/29/17   Delfino Lovett, MD    Allergies Patient has no known allergies.  No family history on file.  Social History Social History   Tobacco Use  . Smoking status: Current Every Day Smoker    Packs/day: 0.50    Types: Cigarettes  . Smokeless tobacco: Never Used  Substance Use Topics  . Alcohol use: Yes    Comment: occasionally  . Drug use: No    Review of Systems Constitutional: No fever/chills Eyes: No visual changes. ENT: No sore throat. No stiff neck no neck pain Cardiovascular: Denies chest pain. Respiratory: Denies shortness of breath. Gastrointestinal:   no vomiting.  No diarrhea.  No constipation. Genitourinary: Negative for dysuria. Musculoskeletal: Negative lower extremity swelling Skin: Negative for rash. Neurological: Negative for severe headaches, focal weakness or numbness.   ____________________________________________   PHYSICAL EXAM:  VITAL SIGNS: ED Triage Vitals  Enc Vitals Group     BP 09/14/17 1718 122/78     Pulse Rate 09/14/17 1718 99     Resp 09/14/17 1718 18     Temp 09/14/17 1718 98.9 F (37.2 C)     Temp Source 09/14/17 1718 Oral     SpO2 09/14/17 1718 100 %     Weight 09/14/17 1717 150 lb (  68 kg)     Height 09/14/17 1717 5\' 9"  (1.753 m)     Head Circumference --      Peak Flow --      Pain Score 09/14/17 1724 0     Pain Loc --      Pain Edu? --      Excl. in GC? --     Constitutional: Alert and oriented. Well appearing and in no acute distress. Eyes: Conjunctivae are normal Head: Atraumatic HEENT: No congestion/rhinnorhea. Mucous membranes are moist.  Oropharynx non-erythematous Neck:   Nontender with no meningismus, no masses, no stridor Cardiovascular: Normal rate, regular rhythm. Grossly normal heart sounds.  Good  peripheral circulation. Respiratory: Normal respiratory effort.  No retractions. Lungs CTAB. Abdominal: Soft and nontender. No distention. No guarding no rebound Back:  There is no focal tenderness or step off.  there is no midline tenderness there are no lesions noted. there is no CVA tenderness Musculoskeletal: No lower extremity tenderness, no upper extremity tenderness. No joint effusions, no DVT signs strong distal pulses no edema Neurologic:  Normal speech and language. No gross focal neurologic deficits are appreciated.  Skin:  Skin is warm, dry and intact. No rash noted. Psychiatric: Mood and affect are normal. Speech and behavior are normal.  ____________________________________________   LABS (all labs ordered are listed, but only abnormal results are displayed)  Labs Reviewed  GLUCOSE, CAPILLARY - Abnormal; Notable for the following components:      Result Value   Glucose-Capillary >600 (*)    All other components within normal limits  GLUCOSE, CAPILLARY - Abnormal; Notable for the following components:   Glucose-Capillary >600 (*)    All other components within normal limits  BASIC METABOLIC PANEL - Abnormal; Notable for the following components:   Sodium 129 (*)    Chloride 95 (*)    Glucose, Bld 858 (*)    Calcium 8.4 (*)    All other components within normal limits  CBC - Abnormal; Notable for the following components:   RBC 4.21 (*)    All other components within normal limits  URINALYSIS, COMPLETE (UACMP) WITH MICROSCOPIC - Abnormal; Notable for the following components:   Color, Urine COLORLESS (*)    APPearance CLEAR (*)    Glucose, UA >=500 (*)    Ketones, ur 5 (*)    All other components within normal limits  BLOOD GAS, VENOUS  MAGNESIUM  CBG MONITORING, ED  CBG MONITORING, ED    Pertinent labs  results that were available during my care of the patient were reviewed by me and considered in my medical decision making (see chart for  details). ____________________________________________  EKG  I personally interpreted any EKGs ordered by me or triage  ____________________________________________  RADIOLOGY  Pertinent labs & imaging results that were available during my care of the patient were reviewed by me and considered in my medical decision making (see chart for details). If possible, patient and/or family made aware of any abnormal findings.  No results found. ____________________________________________    PROCEDURES  Procedure(s) performed: None  Procedures  Critical Care performed: CRITICAL CARE Performed by: Jeanmarie PlantJAMES A MCSHANE   Total critical care time: 38 minutes  Critical care time was exclusive of separately billable procedures and treating other patients.  Critical care was necessary to treat or prevent imminent or life-threatening deterioration.  Critical care was time spent personally by me on the following activities: development of treatment plan with patient and/or surrogate as well as nursing, discussions  with consultants, evaluation of patient's response to treatment, examination of patient, obtaining history from patient or surrogate, ordering and performing treatments and interventions, ordering and review of laboratory studies, ordering and review of radiographic studies, pulse oximetry and re-evaluation of patient's condition.   ____________________________________________   INITIAL IMPRESSION / ASSESSMENT AND PLAN / ED COURSE  Pertinent labs & imaging results that were available during my care of the patient were reviewed by me and considered in my medical decision making (see chart for details).  Patient with no complaints, no evidence of DKA he has no anion gap is not acidotic, we are giving him insulin, IV fluids and we can hopefully get his sugar down to more reasonable level.  He states his baseline sugars in the 300s, which would be a bad goal for him.  Given that is not  DKA, do not see any reason to admit him to the hospital if we can get his sugars down and he remains well.  We are giving him IV fluids although he does not appear to be markedly dehydrated, and with a sugar of 800 has to be, and we are going to see if we can get his cuts better control.  He does have his insulin and can take it after leaving.  And there is a GI nurse who can administer it.  In addition, I see no evidence of ACS PE dissection myocarditis endocarditis or infection or other acute biologic stressor that is causing him to have elevated glucose.  He has poor glucose control at baseline and had a bunch of sugar and his glucose is high.    ____________________________________________   FINAL CLINICAL IMPRESSION(S) / ED DIAGNOSES  Final diagnoses:  None      This chart was dictated using voice recognition software.  Despite best efforts to proofread,  errors can occur which can change meaning.      Jeanmarie Plant, MD 09/14/17 1926    Jeanmarie Plant, MD 09/29/17 [phone]

## 2017-09-14 NOTE — ED Notes (Signed)
Pt's care discussed with Dr. Alphonzo LemmingsMcshane, see orders.

## 2017-09-14 NOTE — ED Notes (Signed)
Pt stated that he was using the bathroom a lot today. When he got to jail they checked his sugar it was extremely high and they brought him to ED. Pt alert and oriented x 4. Pt in handcuffs. Officers at bedside

## 2017-09-14 NOTE — Discharge Instructions (Addendum)
Insulin as directed, do not stray from your recommended diabetic diet

## 2017-09-14 NOTE — ED Triage Notes (Signed)
Pt presents to ED via [facility] in police custody at this time. Per [facility] pt CBG was checked at the jail and CBG reading was high, upon arrival to ED pt's CBG reads "HI". Pt denies any complaints at this time, pt states takes regular insulin and 70/30 mix, pt states last dose of insulin was this morning at approx 0630. Pt is alert and oriented. Per [facility] potential that patient may have ingested crack cocaine today as well. VSS and WNlL, pt is alert and oriented, denies any CP, SOB at this time.

## 2017-09-15 LAB — GLUCOSE, CAPILLARY: Glucose-Capillary: >600 mg/dL (ref 70–99)

## 2018-04-08 ENCOUNTER — Emergency Department
Admission: EM | Admit: 2018-04-08 | Discharge: 2018-04-08 | Disposition: A | Discharge status: Home / Self Care | Payer: Self-pay | Attending: Emergency Medicine | Admitting: Emergency Medicine

## 2018-04-08 ENCOUNTER — Other Ambulatory Visit: Payer: Self-pay

## 2018-04-08 DIAGNOSIS — R197 Diarrhea, unspecified: Secondary | ICD-10-CM | POA: Insufficient documentation

## 2018-04-08 DIAGNOSIS — Z79899 Other long term (current) drug therapy: Secondary | ICD-10-CM | POA: Insufficient documentation

## 2018-04-08 DIAGNOSIS — F17210 Nicotine dependence, cigarettes, uncomplicated: Secondary | ICD-10-CM | POA: Insufficient documentation

## 2018-04-08 DIAGNOSIS — E109 Type 1 diabetes mellitus without complications: Secondary | ICD-10-CM | POA: Insufficient documentation

## 2018-04-08 LAB — CBC
HCT: 38.7 % — ABNORMAL LOW (ref 39.0–52.0)
HEMOGLOBIN: 12.6 g/dL — AB (ref 13.0–17.0)
MCH: 30.3 pg (ref 26.0–34.0)
MCHC: 32.6 g/dL (ref 30.0–36.0)
MCV: 93 fL (ref 80.0–100.0)
Platelets: 267 10*3/uL (ref 150–400)
RBC: 4.16 MIL/uL — AB (ref 4.22–5.81)
RDW: 15 % (ref 11.5–15.5)
WBC: 7.1 10*3/uL (ref 4.0–10.5)
nRBC: 0 % (ref 0.0–0.2)

## 2018-04-08 LAB — COMPREHENSIVE METABOLIC PANEL
ALT: 45 U/L — ABNORMAL HIGH (ref 0–44)
ANION GAP: 6 (ref 5–15)
AST: 39 U/L (ref 15–41)
Albumin: 3.7 g/dL (ref 3.5–5.0)
Alkaline Phosphatase: 146 U/L — ABNORMAL HIGH (ref 38–126)
BILIRUBIN TOTAL: 0.5 mg/dL (ref 0.3–1.2)
BUN: 12 mg/dL (ref 6–20)
CO2: 28 mmol/L (ref 22–32)
Calcium: 8.9 mg/dL (ref 8.9–10.3)
Chloride: 99 mmol/L (ref 98–111)
Creatinine, Ser: 0.72 mg/dL (ref 0.61–1.24)
GFR calc Af Amer: >60 mL/min (ref >60)
Glucose, Bld: 218 mg/dL — ABNORMAL HIGH (ref 70–99)
POTASSIUM: 4.1 mmol/L (ref 3.5–5.1)
Sodium: 133 mmol/L — ABNORMAL LOW (ref 135–145)
TOTAL PROTEIN: 7.5 g/dL (ref 6.5–8.1)

## 2018-04-08 LAB — LIPASE, BLOOD: Lipase: 39 U/L (ref 11–51)

## 2018-04-08 MED ORDER — LOPERAMIDE HCL 2 MG PO CAPS
4.0000 mg | ORAL_CAPSULE | Freq: Once | ORAL | Status: AC
  Administered 2018-04-08: 4 mg via ORAL
  Filled 2018-04-08: qty 2

## 2018-04-08 MED ORDER — SODIUM CHLORIDE 0.9% FLUSH: 3.0000 mL | Freq: Once | INTRAVENOUS | Status: DC

## 2018-04-08 MED ORDER — LOPERAMIDE HCL 2 MG PO TABS: 2.0000 mg | ORAL_TABLET | Freq: Four times a day (QID) | ORAL | 0 refills | Status: DC | PRN

## 2018-04-08 NOTE — ED Notes (Signed)
First Nurse Note: Pt to ED via [facility] for RUQ and RLQ abd pain. Pt is in NAD. Vital signs with EMS as listed below.  CBG- 303 hx/o DM HR 104 BP 136/99 SpO2 100-RA

## 2018-04-08 NOTE — ED Provider Notes (Signed)
[facility] Emergency Department Provider Note   ____________________________________________    I have reviewed the triage vital signs and the nursing notes.   HISTORY  Chief Complaint Abdominal Pain     HPI Garrett [name] is a 33 y.o. male who presents with complaints of intermittent abdominal cramping and diarrhea.  He notes that he has had loose stools for approximately 5 to 7 days.  No abdominal pain currently.  No nausea or vomiting.  He feels like when he eats anything it goes right through him.  Denies fevers or chills.  No recent travel.  Did try an Imodium for this with no significant improvement.  Past Medical History:  Diagnosis Date  . Compression fracture of C-spine (HCC)   . Type 1 diabetes [facility])     Patient Active Problem List   Diagnosis Date Noted  . DKA (diabetic ketoacidoses) (HCC) 03/27/2017    History reviewed. No pertinent surgical history.  Prior to Admission medications   Medication Sig Start Date End Date Taking? Authorizing Provider  insulin aspart (NOVOLOG) 100 UNIT/ML injection Inject 5 Units into the skin 3 (three) times daily with meals. Uses sliding scale - max 14u at any dose 03/29/17   Delfino Lovett, MD  insulin aspart protamine- aspart (NOVOLOG MIX 70/30) (70-30) 100 UNIT/ML injection Inject 0.3 mLs (30 Units total) into the skin 2 (two) times daily. 03/29/17   Delfino Lovett, MD  loperamide (IMODIUM A-D) 2 MG tablet Take 1 tablet (2 mg total) by mouth 4 (four) times daily as needed for diarrhea or loose stools. 04/08/18   Jene Every, MD     Allergies Patient has no known allergies.  No family history on file.  Social History Social History   Tobacco Use  . Smoking status: Current Every Day Smoker    Packs/day: 0.50    Types: Cigarettes  . Smokeless tobacco: Never Used  Substance Use Topics  . Alcohol use: Yes    Comment: occasionally  . Drug use: No    Review of Systems  Constitutional: No  fever/chills Eyes: No visual changes.  ENT: No sore throat. Cardiovascular: Denies chest pain. Respiratory: Denies shortness of breath. Gastrointestinal: As above Genitourinary: Negative for dysuria. Musculoskeletal: Negative for back pain. Skin: Negative for rash. Neurological: Negative for headaches    ____________________________________________   PHYSICAL EXAM:  VITAL SIGNS: ED Triage Vitals  Enc Vitals Group     BP 04/08/18 0931 115/71     Pulse Rate 04/08/18 0931 (!) 101     Resp 04/08/18 0931 19     Temp 04/08/18 0931 98.2 F (36.8 C)     Temp src --      SpO2 04/08/18 0931 98 %     Weight 04/08/18 0932 65.8 kg (145 lb)     Height 04/08/18 0932 1.753 m (5\' 9" )     Head Circumference --      Peak Flow --      Pain Score 04/08/18 0931 10     Pain Loc --      Pain Edu? --      Excl. in GC? --     Constitutional: Alert and oriented. No acute distress. Pleasant and interactive Eyes: Conjunctivae are normal.  Nose: No congestion/rhinnorhea. Mouth/Throat: Mucous membranes are moist.    Cardiovascular: Normal rate, regular rhythm.  Good peripheral circulation. Respiratory: Normal respiratory effort.  No retractions. Lungs CTAB. Gastrointestinal: Soft and nontender. No distention.  No CVA tenderness.  Musculoskeletal: No lower  extremity tenderness nor edema.  Warm and well perfused Neurologic:  Normal speech and language. No gross focal neurologic deficits are appreciated.  Skin:  Skin is warm, dry and intact. No rash noted. Psychiatric: Mood and affect are normal. Speech and behavior are normal.  ____________________________________________   LABS (all labs ordered are listed, but only abnormal results are displayed)  Labs Reviewed  COMPREHENSIVE METABOLIC PANEL - Abnormal; Notable for the following components:      Result Value   Sodium 133 (*)    Glucose, Bld 218 (*)    ALT 45 (*)    Alkaline Phosphatase 146 (*)    All other components within normal  limits  CBC - Abnormal; Notable for the following components:   RBC 4.16 (*)    Hemoglobin 12.6 (*)    HCT 38.7 (*)    All other components within normal limits  LIPASE, BLOOD   ____________________________________________  EKG  None ____________________________________________  RADIOLOGY  None ____________________________________________   PROCEDURES  Procedure(s) performed: No  Procedures   Critical Care performed: No ____________________________________________   INITIAL IMPRESSION / ASSESSMENT AND PLAN / ED COURSE  Pertinent labs & imaging results that were available during my care of the patient were reviewed by me and considered in my medical decision making (see chart for details).  Patient presents with primary complaint of diarrhea, lab work is overall quite reassuring, abdominal exam is benign, patient well-appearing and in no acute distress.  Recommend supportive care outpatient follow-up, trial Imodium taken properly    ____________________________________________   FINAL CLINICAL IMPRESSION(S) / ED DIAGNOSES  Final diagnoses:  Diarrhea, unspecified type        Note:  This document was prepared using Dragon voice recognition software and may include unintentional dictation errors.   Jene Every, MD 04/08/18 [phone]

## 2018-04-08 NOTE — ED Notes (Signed)
See triage note  Presents with some abd cramping and diarrhea  States abd cramping started 2 weeks ago  States the abd cramping has eased off since being in ED  But conts to have diarrhea

## 2018-04-08 NOTE — ED Triage Notes (Signed)
Pt comes via [facility] from home with c/o 2 week abdominal pain. Pt states it has gotten worse. Pt states diarrhea. Pt denies any N/V.  Pt states decreased oral intake.

## 2018-08-14 ENCOUNTER — Other Ambulatory Visit: Payer: Self-pay

## 2018-08-14 ENCOUNTER — Observation Stay
Admission: EM | Admit: 2018-08-14 | Discharge: 2018-08-15 | Discharge status: Against Medical Advice | Payer: Medicaid - Out of State | Attending: Internal Medicine | Admitting: Internal Medicine

## 2018-08-14 ENCOUNTER — Emergency Department: Payer: Medicaid - Out of State

## 2018-08-14 DIAGNOSIS — E1165 Type 2 diabetes mellitus with hyperglycemia: Secondary | ICD-10-CM | POA: Diagnosis present

## 2018-08-14 DIAGNOSIS — L03114 Cellulitis of left upper limb: Principal | ICD-10-CM | POA: Diagnosis present

## 2018-08-14 DIAGNOSIS — Z794 Long term (current) use of insulin: Secondary | ICD-10-CM | POA: Insufficient documentation

## 2018-08-14 DIAGNOSIS — F17210 Nicotine dependence, cigarettes, uncomplicated: Secondary | ICD-10-CM | POA: Diagnosis not present

## 2018-08-14 DIAGNOSIS — Z1159 Encounter for screening for other viral diseases: Secondary | ICD-10-CM | POA: Insufficient documentation

## 2018-08-14 DIAGNOSIS — E871 Hypo-osmolality and hyponatremia: Secondary | ICD-10-CM | POA: Diagnosis not present

## 2018-08-14 DIAGNOSIS — E1065 Type 1 diabetes mellitus with hyperglycemia: Secondary | ICD-10-CM | POA: Diagnosis not present

## 2018-08-14 DIAGNOSIS — R739 Hyperglycemia, unspecified: Secondary | ICD-10-CM | POA: Diagnosis present

## 2018-08-14 LAB — CBC
HCT: 35.7 % — ABNORMAL LOW (ref 39.0–52.0)
Hemoglobin: 11.4 g/dL — ABNORMAL LOW (ref 13.0–17.0)
MCH: 30.5 pg (ref 26.0–34.0)
MCHC: 31.9 g/dL (ref 30.0–36.0)
MCV: 95.5 fL (ref 80.0–100.0)
Platelets: 422 10*3/uL — ABNORMAL HIGH (ref 150–400)
RBC: 3.74 MIL/uL — ABNORMAL LOW (ref 4.22–5.81)
RDW: 13.6 % (ref 11.5–15.5)
WBC: 10.4 10*3/uL (ref 4.0–10.5)
nRBC: 0 % (ref 0.0–0.2)

## 2018-08-14 LAB — COMPREHENSIVE METABOLIC PANEL
ALT: 18 U/L (ref 0–44)
AST: 19 U/L (ref 15–41)
Albumin: 3 g/dL — ABNORMAL LOW (ref 3.5–5.0)
Alkaline Phosphatase: 180 U/L — ABNORMAL HIGH (ref 38–126)
Anion gap: 14 (ref 5–15)
BUN: 16 mg/dL (ref 6–20)
CO2: 25 mmol/L (ref 22–32)
Calcium: 8.8 mg/dL — ABNORMAL LOW (ref 8.9–10.3)
Chloride: 92 mmol/L — ABNORMAL LOW (ref 98–111)
Creatinine, Ser: 0.94 mg/dL (ref 0.61–1.24)
GFR calc Af Amer: >60 mL/min (ref >60)
GFR calc non Af Amer: >60 mL/min (ref >60)
Glucose, Bld: 663 mg/dL (ref 70–99)
Potassium: 3.8 mmol/L (ref 3.5–5.1)
Sodium: 131 mmol/L — ABNORMAL LOW (ref 135–145)
Total Bilirubin: 0.2 mg/dL — ABNORMAL LOW (ref 0.3–1.2)
Total Protein: 7.7 g/dL (ref 6.5–8.1)

## 2018-08-14 LAB — GLUCOSE, CAPILLARY
Glucose-Capillary: 345 mg/dL — ABNORMAL HIGH (ref 70–99)
Glucose-Capillary: 208 mg/dL — ABNORMAL HIGH (ref 70–99)

## 2018-08-14 LAB — LACTIC ACID, PLASMA: Lactic Acid, Venous: 1.8 mmol/L (ref 0.5–1.9)

## 2018-08-14 LAB — SARS CORONAVIRUS 2 BY RT PCR (HOSPITAL ORDER, PERFORMED IN ~~LOC~~ HOSPITAL LAB): SARS Coronavirus 2: NEGATIVE

## 2018-08-14 MED ORDER — VANCOMYCIN HCL 1.5 G IV SOLR
1500.0000 mg | Freq: Once | INTRAVENOUS | Status: AC
  Administered 2018-08-14: 23:00:00 1500 mg via INTRAVENOUS
  Filled 2018-08-14: qty 1500

## 2018-08-14 MED ORDER — SODIUM CHLORIDE 0.9 % IV BOLUS
1000.0000 mL | Freq: Once | INTRAVENOUS | Status: AC
  Administered 2018-08-14: 1000 mL via INTRAVENOUS

## 2018-08-14 MED ORDER — SODIUM CHLORIDE 0.9 % IV SOLN
2.0000 g | Freq: Once | INTRAVENOUS | Status: AC
  Administered 2018-08-14: 2 g via INTRAVENOUS

## 2018-08-14 MED ORDER — VANCOMYCIN HCL IN DEXTROSE 1-5 GM/200ML-% IV SOLN
1000.0000 mg | Freq: Once | INTRAVENOUS | Status: DC
  Filled 2018-08-14: qty 200

## 2018-08-14 MED ORDER — IOHEXOL 300 MG/ML  SOLN
100.0000 mL | Freq: Once | INTRAMUSCULAR | Status: AC | PRN
  Administered 2018-08-14: 100 mL via INTRAVENOUS

## 2018-08-14 MED ORDER — METRONIDAZOLE IN NACL 5-0.79 MG/ML-% IV SOLN
500.0000 mg | Freq: Once | INTRAVENOUS | Status: AC
  Administered 2018-08-14: 500 mg via INTRAVENOUS
  Filled 2018-08-14: qty 100

## 2018-08-14 MED ORDER — MORPHINE SULFATE (PF) 4 MG/ML IV SOLN
4.0000 mg | INTRAVENOUS | Status: DC | PRN
  Administered 2018-08-14: 4 mg via INTRAVENOUS
  Filled 2018-08-14: qty 1

## 2018-08-14 MED ORDER — ONDANSETRON HCL 4 MG/2ML IJ SOLN
4.0000 mg | Freq: Once | INTRAMUSCULAR | Status: AC
  Administered 2018-08-14: 4 mg via INTRAVENOUS
  Filled 2018-08-14: qty 2

## 2018-08-14 MED ORDER — VANCOMYCIN HCL 10 G IV SOLR
1500.0000 mg | Freq: Once | INTRAVENOUS | Status: DC
  Filled 2018-08-14: qty 1500

## 2018-08-14 MED ORDER — SODIUM CHLORIDE 0.9 % IV SOLN
2.0000 g | Freq: Once | INTRAVENOUS | Status: DC
  Filled 2018-08-14: qty 2

## 2018-08-14 NOTE — ED Notes (Addendum)
Pt states he took 5 units of fast acting insulin while in lobby, CBG 345 at this time

## 2018-08-14 NOTE — ED Notes (Signed)
Pt transported to ct before starting antibiotics

## 2018-08-14 NOTE — Progress Notes (Signed)
PHARMACY -  BRIEF ANTIBIOTIC NOTE   Pharmacy has received consult(s) for Vancomycin, Cefepime  from an ED provider.  The patient's profile has been reviewed for ht/wt/allergies/indication/available labs.    One time order(s) placed for Vancomycin 1500 mg IV X 1 and Cefepime 2 gm IV X 1.   Further antibiotics/pharmacy consults should be ordered by admitting physician if indicated.                       Thank you, Ahman Dugdale D 08/14/2018  10:05 PM

## 2018-08-14 NOTE — ED Notes (Signed)
Critical lab result called, Glucose 663

## 2018-08-14 NOTE — ED Triage Notes (Signed)
Pt in with co right arm swelling and pain x 1 week. Pt unsure of injury or cause, swelling noted from elbow to mid forearm.

## 2018-08-14 NOTE — ED Notes (Signed)
Pt given meal tray and diet sprite at this time, okay per md

## 2018-08-14 NOTE — ED Provider Notes (Signed)
[facility] Emergency Department Provider Note    First MD Initiated Contact with Patient 08/14/18 2139     (approximate)  I have reviewed the triage vital signs and the nursing notes.   HISTORY  Chief Complaint Wound Infection    HPI Danna HeftyOrlando Robers is a 33 y.o. male below listed past medical history presents the ER for evaluation of 4 days of progressively worsening left arm pain and swelling with warmth fevers and chills at home.  States his blood sugars been running in the 600s.  He is an insulin-dependent diabetic.  Denies any nausea or vomiting.  No shortness of breath.  Has had cellulitis in the other arm previously.  States that the area of swelling is not near where he gives himself injections.  Does not use IV drugs.    Past Medical History:  Diagnosis Date  . Compression fracture of C-spine (HCC)   . Type 1 diabetes (HCC)    No family history on file. No past surgical history on file. Patient Active Problem List   Diagnosis Date Noted  . DKA (diabetic ketoacidoses) (HCC) 03/27/2017      Prior to Admission medications   Medication Sig Start Date End Date Taking? Authorizing Provider  insulin aspart (NOVOLOG) 100 UNIT/ML injection Inject 5 Units into the skin 3 (three) times daily with meals. Uses sliding scale - max 14u at any dose 03/29/17   Delfino LovettShah, Vipul, MD  insulin aspart protamine- aspart (NOVOLOG MIX 70/30) (70-30) 100 UNIT/ML injection Inject 0.3 mLs (30 Units total) into the skin 2 (two) times daily. 03/29/17   Delfino LovettShah, Vipul, MD  loperamide (IMODIUM A-D) 2 MG tablet Take 1 tablet (2 mg total) by mouth 4 (four) times daily as needed for diarrhea or loose stools. 04/08/18   Jene EveryKinner, Robert, MD    Allergies Patient has no known allergies.    Social History Social History   Tobacco Use  . Smoking status: Current Every Day Smoker    Packs/day: 0.50    Types: Cigarettes  . Smokeless tobacco: Never Used  Substance Use Topics  . Alcohol  use: Yes    Comment: occasionally  . Drug use: No    Review of Systems Patient denies headaches, rhinorrhea, blurry vision, numbness, shortness of breath, chest pain, edema, cough, abdominal pain, nausea, vomiting, diarrhea, dysuria, fevers, rashes or hallucinations unless otherwise stated above in HPI. ____________________________________________   PHYSICAL EXAM:  VITAL SIGNS: Vitals:   08/14/18 1925 08/14/18 2122  BP: (!) 139/97   Pulse: (!) 112 100  Resp: 20   Temp: 99.9 F (37.7 C)   SpO2: 97% 100%    Constitutional: Alert and oriented.  Eyes: Conjunctivae are normal.  Head: Atraumatic. Nose: No congestion/rhinnorhea. Mouth/Throat: Mucous membranes are moist.   Neck: No stridor. Painless ROM.  Cardiovascular: Normal rate, regular rhythm. Grossly normal heart sounds.  Good peripheral circulation. Respiratory: Normal respiratory effort.  No retractions. Lungs CTAB. Gastrointestinal: Soft and nontender. No distention. No abdominal bruits. No CVA tenderness. Genitourinary:  Musculoskeletal: Large area of redness cellulitis and swelling of the left forearm on the lateral aspect.  No fluctuance.  No crepitus.  Is tender with pain worsened with flexion of the wrist.  No lower extremity tenderness nor edema.  No joint effusions. Neurologic:  Normal speech and language. No gross focal neurologic deficits are appreciated. No facial droop Skin:  Skin is warm, dry and intact. No rash noted. Psychiatric: Mood and affect are normal. Speech and behavior are normal.  ____________________________________________   LABS (all labs ordered are listed, but only abnormal results are displayed)  Results for orders placed or performed during the hospital encounter of 08/14/18 (from the past 24 hour(s))  CBC     Status: Abnormal   Collection Time: 08/14/18  7:28 PM  Result Value Ref Range   WBC 10.4 4.0 - 10.5 K/uL   RBC 3.74 (L) 4.22 - 5.81 MIL/uL   Hemoglobin 11.4 (L) 13.0 - 17.0 g/dL    HCT 35.7 (L) 39.0 - 52.0 %   MCV 95.5 80.0 - 100.0 fL   MCH 30.5 26.0 - 34.0 pg   MCHC 31.9 30.0 - 36.0 g/dL   RDW 13.6 11.5 - 15.5 %   Platelets 422 (H) 150 - 400 K/uL   nRBC 0.0 0.0 - 0.2 %  Comprehensive metabolic panel     Status: Abnormal   Collection Time: 08/14/18  7:28 PM  Result Value Ref Range   Sodium 131 (L) 135 - 145 mmol/L   Potassium 3.8 3.5 - 5.1 mmol/L   Chloride 92 (L) 98 - 111 mmol/L   CO2 25 22 - 32 mmol/L   Glucose, Bld 663 (HH) 70 - 99 mg/dL   BUN 16 6 - 20 mg/dL   Creatinine, Ser 0.94 0.61 - 1.24 mg/dL   Calcium 8.8 (L) 8.9 - 10.3 mg/dL   Total Protein 7.7 6.5 - 8.1 g/dL   Albumin 3.0 (L) 3.5 - 5.0 g/dL   AST 19 15 - 41 U/L   ALT 18 0 - 44 U/L   Alkaline Phosphatase 180 (H) 38 - 126 U/L   Total Bilirubin 0.2 (L) 0.3 - 1.2 mg/dL   GFR calc non Af Amer >60 >60 mL/min   GFR calc Af Amer >60 >60 mL/min   Anion gap 14 5 - 15  Glucose, capillary     Status: Abnormal   Collection Time: 08/14/18  9:10 PM  Result Value Ref Range   Glucose-Capillary 345 (H) 70 - 99 mg/dL  Glucose, capillary     Status: Abnormal   Collection Time: 08/14/18  9:47 PM  Result Value Ref Range   Glucose-Capillary 208 (H) 70 - 99 mg/dL  Lactic acid, plasma     Status: None   Collection Time: 08/14/18 10:12 PM  Result Value Ref Range   Lactic Acid, Venous 1.8 0.5 - 1.9 mmol/L   ____________________________________________ _____________________________  RADIOLOGY  I personally reviewed all radiographic images ordered to evaluate for the above acute complaints and reviewed radiology reports and findings.  These findings were personally discussed with the patient.  Please see medical record for radiology report.  ____________________________________________   PROCEDURES  Procedure(s) performed:  .Critical Care Performed by: Merlyn Lot, MD Authorized by: Merlyn Lot, MD   Critical care provider statement:    Critical care time (minutes):  10   Critical care  time was exclusive of:  Separately billable procedures and treating other patients   Critical care was necessary to treat or prevent imminent or life-threatening deterioration of the following conditions:  Sepsis   Critical care was time spent personally by me on the following activities:  Development of treatment plan with patient or surrogate, discussions with consultants, evaluation of patient's response to treatment, examination of patient, obtaining history from patient or surrogate, ordering and performing treatments and interventions, ordering and review of laboratory studies, ordering and review of radiographic studies, pulse oximetry, re-evaluation of patient's condition and review of old charts      Critical  Care performed: yes ____________________________________________   INITIAL IMPRESSION / ASSESSMENT AND PLAN / ED COURSE  Pertinent labs & imaging results that were available during my care of the patient were reviewed by me and considered in my medical decision making (see chart for details).   DDX: cellulitis, abscess, contusion, nsti, sepsis, dka  Danna HeftyOrlando Ginther is a 33 y.o. who presents to the ED with was as described above.  Patient with evidence of hyperglycemia does have positive sirs with area of swelling to the left elbow.  Not consistent with septic arthritis but certainly concerning for deep space infection based on the extent of his pain.  Will order CT imaging to evaluate for abscess or necrotizing infection will start broad-spectrum antibiotics as he is a diabetic.  We will continue with IV fluids.   Clinical Course as of Aug 13 2299  Tue Aug 14, 2018  2243 CT without evidence of deep space or necrotizing infection.  We will continue with IV antibiotics for cellulitis.  Given his elevated with controlled diabetes do feel patient will require hospitalization.   [PR]    Clinical Course User Index [PR] Willy Eddyobinson, Lovely Kerins, MD    The patient was evaluated in Emergency  Department today for the symptoms described in the history of present illness. He/she was evaluated in the context of the global COVID-19 pandemic, which necessitated consideration that the patient might be at risk for infection with the SARS-CoV-2 virus that causes COVID-19. Institutional protocols and algorithms that pertain to the evaluation of patients at risk for COVID-19 are in a state of rapid change based on information released by regulatory bodies including the CDC and federal and state organizations. These policies and algorithms were followed during the patient's care in the ED.  As part of my medical decision making, I reviewed the following data within the electronic MEDICAL RECORD NUMBER Nursing notes reviewed and incorporated, Labs reviewed, notes from prior ED visits and  Controlled Substance Database   ____________________________________________   FINAL CLINICAL IMPRESSION(S) / ED DIAGNOSES  Final diagnoses:  Cellulitis of left upper extremity  Hyperglycemia      NEW MEDICATIONS STARTED DURING THIS VISIT:  New Prescriptions   No medications on file     Note:  This document was prepared using Dragon voice recognition software and may include unintentional dictation errors.    Willy Eddyobinson, Shirl Weir, MD 08/14/18 [phone]

## 2018-08-15 ENCOUNTER — Other Ambulatory Visit: Payer: Self-pay

## 2018-08-15 ENCOUNTER — Encounter: Payer: Self-pay | Admitting: Internal Medicine

## 2018-08-15 DIAGNOSIS — L03114 Cellulitis of left upper limb: Secondary | ICD-10-CM | POA: Diagnosis present

## 2018-08-15 DIAGNOSIS — E1165 Type 2 diabetes mellitus with hyperglycemia: Secondary | ICD-10-CM | POA: Diagnosis present

## 2018-08-15 LAB — BASIC METABOLIC PANEL
Anion gap: 11 (ref 5–15)
BUN: 14 mg/dL (ref 6–20)
CO2: 22 mmol/L (ref 22–32)
Calcium: 8.3 mg/dL — ABNORMAL LOW (ref 8.9–10.3)
Chloride: 94 mmol/L — ABNORMAL LOW (ref 98–111)
Creatinine, Ser: 0.77 mg/dL (ref 0.61–1.24)
GFR calc Af Amer: >60 mL/min (ref >60)
GFR calc non Af Amer: >60 mL/min (ref >60)
Glucose, Bld: 569 mg/dL (ref 70–99)
Potassium: 4.7 mmol/L (ref 3.5–5.1)
Sodium: 127 mmol/L — ABNORMAL LOW (ref 135–145)

## 2018-08-15 LAB — GLUCOSE, CAPILLARY
Glucose-Capillary: 181 mg/dL — ABNORMAL HIGH (ref 70–99)
Glucose-Capillary: 540 mg/dL (ref 70–99)
Glucose-Capillary: 436 mg/dL — ABNORMAL HIGH (ref 70–99)
Glucose-Capillary: 76 mg/dL (ref 70–99)
Glucose-Capillary: 199 mg/dL — ABNORMAL HIGH (ref 70–99)
Glucose-Capillary: 195 mg/dL — ABNORMAL HIGH (ref 70–99)
Glucose-Capillary: 23 mg/dL — CL (ref 70–99)
Glucose-Capillary: 208 mg/dL — ABNORMAL HIGH (ref 70–99)

## 2018-08-15 LAB — CBC
HCT: 33.3 % — ABNORMAL LOW (ref 39.0–52.0)
Hemoglobin: 10.6 g/dL — ABNORMAL LOW (ref 13.0–17.0)
MCH: 30.2 pg (ref 26.0–34.0)
MCHC: 31.8 g/dL (ref 30.0–36.0)
MCV: 94.9 fL (ref 80.0–100.0)
Platelets: 374 10*3/uL (ref 150–400)
RBC: 3.51 MIL/uL — ABNORMAL LOW (ref 4.22–5.81)
RDW: 13.8 % (ref 11.5–15.5)
WBC: 12.5 10*3/uL — ABNORMAL HIGH (ref 4.0–10.5)
nRBC: 0 % (ref 0.0–0.2)

## 2018-08-15 LAB — HEMOGLOBIN A1C
Hgb A1c MFr Bld: 13.4 % — ABNORMAL HIGH (ref 4.8–5.6)
Mean Plasma Glucose: 337.88 mg/dL

## 2018-08-15 MED ORDER — ONDANSETRON HCL 4 MG/2ML IJ SOLN: 4.0000 mg | Freq: Four times a day (QID) | INTRAMUSCULAR | Status: DC | PRN

## 2018-08-15 MED ORDER — INSULIN ASPART PROT & ASPART (70-30 MIX) 100 UNIT/ML ~~LOC~~ SUSP
30.0000 [IU] | Freq: Two times a day (BID) | SUBCUTANEOUS | Status: DC
  Administered 2018-08-15: 08:00:00 30 [IU] via SUBCUTANEOUS
  Filled 2018-08-15 (×3): qty 10

## 2018-08-15 MED ORDER — ACETAMINOPHEN 650 MG RE SUPP: 650.0000 mg | Freq: Four times a day (QID) | RECTAL | Status: DC | PRN

## 2018-08-15 MED ORDER — INSULIN ASPART 100 UNIT/ML ~~LOC~~ SOLN: 0.0000 [IU] | Freq: Every day | SUBCUTANEOUS | Status: DC

## 2018-08-15 MED ORDER — ACETAMINOPHEN 325 MG PO TABS: 650.0000 mg | ORAL_TABLET | Freq: Four times a day (QID) | ORAL | Status: DC | PRN

## 2018-08-15 MED ORDER — DEXTROSE 50 % IV SOLN
INTRAVENOUS | Status: AC
  Administered 2018-08-15: 50 mL
  Filled 2018-08-15: qty 50

## 2018-08-15 MED ORDER — ONDANSETRON HCL 4 MG PO TABS: 4.0000 mg | ORAL_TABLET | Freq: Four times a day (QID) | ORAL | Status: DC | PRN

## 2018-08-15 MED ORDER — MORPHINE SULFATE (PF) 2 MG/ML IV SOLN: 2.0000 mg | INTRAVENOUS | Status: DC | PRN

## 2018-08-15 MED ORDER — INSULIN ASPART PROT & ASPART (70-30 MIX) 100 UNIT/ML ~~LOC~~ SUSP
40.0000 [IU] | Freq: Two times a day (BID) | SUBCUTANEOUS | Status: DC
  Administered 2018-08-15: 40 [IU] via SUBCUTANEOUS
  Filled 2018-08-15: qty 10

## 2018-08-15 MED ORDER — CEFAZOLIN SODIUM-DEXTROSE 2-4 GM/100ML-% IV SOLN
2.0000 g | Freq: Three times a day (TID) | INTRAVENOUS | Status: DC
  Administered 2018-08-15: 2 g via INTRAVENOUS
  Filled 2018-08-15 (×5): qty 100

## 2018-08-15 MED ORDER — ENOXAPARIN SODIUM 40 MG/0.4ML ~~LOC~~ SOLN
40.0000 mg | SUBCUTANEOUS | Status: DC
  Filled 2018-08-15: qty 0.4

## 2018-08-15 MED ORDER — VANCOMYCIN HCL 10 G IV SOLR
1250.0000 mg | Freq: Two times a day (BID) | INTRAVENOUS | Status: DC
  Filled 2018-08-15: qty 1250

## 2018-08-15 MED ORDER — SODIUM CHLORIDE 0.9 % IV SOLN
INTRAVENOUS | Status: DC
  Administered 2018-08-15 (×2): via INTRAVENOUS

## 2018-08-15 MED ORDER — INSULIN STARTER KIT- SYRINGES (ENGLISH)
1.0000 | Freq: Once | Status: AC
  Administered 2018-08-15: 1
  Filled 2018-08-15: qty 1

## 2018-08-15 MED ORDER — INSULIN ASPART 100 UNIT/ML ~~LOC~~ SOLN
20.0000 [IU] | Freq: Once | SUBCUTANEOUS | Status: AC
  Administered 2018-08-15: 20 [IU] via SUBCUTANEOUS
  Filled 2018-08-15: qty 1

## 2018-08-15 MED ORDER — OXYCODONE HCL 5 MG PO TABS
5.0000 mg | ORAL_TABLET | ORAL | Status: DC | PRN
  Administered 2018-08-15 (×3): 5 mg via ORAL
  Filled 2018-08-15 (×3): qty 1

## 2018-08-15 MED ORDER — METRONIDAZOLE IN NACL 5-0.79 MG/ML-% IV SOLN
500.0000 mg | Freq: Three times a day (TID) | INTRAVENOUS | Status: DC
  Administered 2018-08-15: 08:00:00 500 mg via INTRAVENOUS
  Filled 2018-08-15 (×3): qty 100

## 2018-08-15 MED ORDER — SODIUM CHLORIDE 0.9 % IV SOLN
2.0000 g | Freq: Three times a day (TID) | INTRAVENOUS | Status: DC
  Administered 2018-08-15: 07:00:00 2 g via INTRAVENOUS
  Filled 2018-08-15 (×3): qty 2

## 2018-08-15 MED ORDER — INSULIN ASPART 100 UNIT/ML ~~LOC~~ SOLN
0.0000 [IU] | Freq: Three times a day (TID) | SUBCUTANEOUS | Status: DC
  Administered 2018-08-15: 5 [IU] via SUBCUTANEOUS
  Filled 2018-08-15: qty 1

## 2018-08-15 MED ORDER — INSULIN ASPART 100 UNIT/ML ~~LOC~~ SOLN
5.0000 [IU] | Freq: Three times a day (TID) | SUBCUTANEOUS | Status: DC
  Administered 2018-08-15 (×2): 5 [IU] via SUBCUTANEOUS
  Filled 2018-08-15 (×2): qty 1

## 2018-08-15 MED ORDER — VANCOMYCIN HCL 1.25 G IV SOLR
1250.0000 mg | Freq: Two times a day (BID) | INTRAVENOUS | Status: DC
  Filled 2018-08-15 (×2): qty 1250

## 2018-08-15 NOTE — ED Notes (Signed)
ED TO INPATIENT HANDOFF REPORT  ED Nurse Name and Phone #: Berline Lopesgracie [phone]  S Name/Age/Gender Garrett [name] 33 y.o. male Room/Bed: ED04A/ED04A  Code Status   Code Status: Prior  Home/SNF/Other Home Patient oriented to: self, place, time and situation Is this baseline? Yes   Triage Complete: Triage complete  Chief Complaint left arm swelling  Triage Note Pt in with co right arm swelling and pain x 1 week. Pt unsure of injury or cause, swelling noted from elbow to mid forearm.    Allergies No Known Allergies  Level of Care/Admitting Diagnosis ED Disposition    ED Disposition Condition Comment   Admit  Hospital Area: [facility] [[id number]]  Level of Care: Med-Surg [16]  Covid Evaluation: Confirmed COVID Negative  Diagnosis: Left arm cellulitis [[id number]][[id number]]  Admitting Physician: Oralia ManisWILLIS, DAVID [[id number]]  Attending Physician: Oralia ManisWILLIS, DAVID [[id number]]  PT Class (Do Not Modify): Observation [104]  PT Acc Code (Do Not Modify): Observation [10022]       B Medical/Surgery History Past Medical History:  Diagnosis Date  . Compression fracture of C-spine (HCC)   . Type 1 diabetes [facility](HCC)    Past Surgical History:  Procedure Laterality Date  . NO PAST SURGERIES       A IV Location/Drains/Wounds Patient Lines/Drains/Airways Status   Active Line/Drains/Airways    Name:   Placement date:   Placement time:   Site:   Days:   Peripheral IV 08/14/18 Right Antecubital   08/14/18    2051    Antecubital   1   Wound / Incision (Open or Dehisced) 03/27/17 Diabetic ulcer Toe (Comment  which one) Left Red   03/27/17    1400    Toe (Comment  which one)   506          Intake/Output Last 24 hours  Intake/Output Summary (Last 24 hours) at 08/15/2018 0046 Last data filed at 08/15/2018 0011 Gross per 24 hour  Intake 1531.61 ml  Output -  Net 1531.61 ml    Labs/Imaging Results for orders placed or performed during the hospital encounter of 08/14/18 (from the past 48  hour(s))  CBC     Status: Abnormal   Collection Time: 08/14/18  7:28 PM  Result Value Ref Range   WBC 10.4 4.0 - 10.5 K/uL   RBC 3.74 (L) 4.22 - 5.81 MIL/uL   Hemoglobin 11.4 (L) 13.0 - 17.0 g/dL   HCT 08.635.7 (L) 57.839.0 - 46.952.0 %   MCV 95.5 80.0 - 100.0 fL   MCH 30.5 26.0 - 34.0 pg   MCHC 31.9 30.0 - 36.0 g/dL   RDW 62.913.6 52.811.5 - 41.315.5 %   Platelets 422 (H) 150 - 400 K/uL   nRBC 0.0 0.0 - 0.2 %    Comment: Performed at [facility], [address] [place], [place] [address]  Comprehensive metabolic panel     Status: Abnormal   Collection Time: 08/14/18  7:28 PM  Result Value Ref Range   Sodium 131 (L) 135 - 145 mmol/L   Potassium 3.8 3.5 - 5.1 mmol/L   Chloride 92 (L) 98 - 111 mmol/L   CO2 25 22 - 32 mmol/L   Glucose, Bld 663 (HH) 70 - 99 mg/dL    Comment: CRITICAL RESULT CALLED TO, READ BACK BY AND VERIFIED WITH TIFFANY JOHNSON AT 2023 08/14/2018  TFK    BUN 16 6 - 20 mg/dL   Creatinine, Ser 0.270.94 0.61 - 1.24 mg/dL   Calcium 8.8 (L) 8.9 -  10.3 mg/dL   Total Protein 7.7 6.5 - 8.1 g/dL   Albumin 3.0 (L) 3.5 - 5.0 g/dL   AST 19 15 - 41 U/L   ALT 18 0 - 44 U/L   Alkaline Phosphatase 180 (H) 38 - 126 U/L   Total Bilirubin 0.2 (L) 0.3 - 1.2 mg/dL   GFR calc non Af Amer >60 >60 mL/min   GFR calc Af Amer >60 >60 mL/min   Anion gap 14 5 - 15    Comment: Performed at [facility], [address] [place], [place] [address]  Glucose, capillary     Status: Abnormal   Collection Time: 08/14/18  9:10 PM  Result Value Ref Range   Glucose-Capillary 345 (H) 70 - 99 mg/dL  Glucose, capillary     Status: Abnormal   Collection Time: 08/14/18  9:47 PM  Result Value Ref Range   Glucose-Capillary 208 (H) 70 - 99 mg/dL  Lactic acid, plasma     Status: None   Collection Time: 08/14/18 10:12 PM  Result Value Ref Range   Lactic Acid, Venous 1.8 0.5 - 1.9 mmol/L    Comment: Performed at [facility], [address] [place], [place] [address]  SARS Coronavirus 2  (CEPHEID- Performed in [facility] hospital lab), Hosp Order     Status: None   Collection Time: 08/14/18 10:36 PM   Specimen: Nasopharyngeal Swab  Result Value Ref Range   SARS Coronavirus 2 NEGATIVE NEGATIVE    Comment: (NOTE) If result is NEGATIVE SARS-CoV-2 target nucleic acids are NOT DETECTED. The SARS-CoV-2 RNA is generally detectable in upper and lower  respiratory specimens during the acute phase of infection. The lowest  concentration of SARS-CoV-2 viral copies this assay can detect is 250  copies / mL. A negative result does not preclude SARS-CoV-2 infection  and should not be used as the sole basis for treatment or other  patient management decisions.  A negative result may occur with  improper specimen collection / handling, submission of specimen other  than nasopharyngeal swab, presence of viral mutation(s) within the  areas targeted by this assay, and inadequate number of viral copies  (<250 copies / mL). A negative result must be combined with clinical  observations, patient history, and epidemiological information. If result is POSITIVE SARS-CoV-2 target nucleic acids are DETECTED. The SARS-CoV-2 RNA is generally detectable in upper and lower  respiratory specimens dur ing the acute phase of infection.  Positive  results are indicative of active infection with SARS-CoV-2.  Clinical  correlation with patient history and other diagnostic information is  necessary to determine patient infection status.  Positive results do  not rule out bacterial infection or co-infection with other viruses. If result is PRESUMPTIVE POSTIVE SARS-CoV-2 nucleic acids MAY BE PRESENT.   A presumptive positive result was obtained on the submitted specimen  and confirmed on repeat testing.  While 2019 novel coronavirus  (SARS-CoV-2) nucleic acids may be present in the submitted sample  additional confirmatory testing may be necessary for epidemiological  and / or clinical management  purposes  to differentiate between  SARS-CoV-2 and other Sarbecovirus currently known to infect humans.  If clinically indicated additional testing with an alternate test  methodology [phone] is advised. The SARS-CoV-2 RNA is generally  detectable in upper and lower respiratory sp ecimens during the acute  phase of infection. The expected result is Negative. Fact Sheet for Patients:  BoilerBrush.com.cyhttps://www.fda.gov/media/136312/download Fact Sheet for Healthcare Providers: https://pope.com/https://www.fda.gov/media/136313/download This test is not yet  approved or cleared by the [place] and has been authorized for detection and/or diagnosis of SARS-CoV-2 by FDA under an Emergency Use Authorization (EUA).  This EUA will remain in effect (meaning this test can be used) for the duration of the COVID-19 declaration under Section 564(b)(1) of the Act, 21 U.S.C. section 360bbb-3(b)(1), unless the authorization is terminated or revoked sooner. Performed at [facility], [address] [place], [place] [address]    Ct Forearm Left W Contrast  Result Date: 08/14/2018 CLINICAL DATA:  Left arm swelling, pain.  Concern for abscess. EXAM: CT OF THE UPPER LEFT EXTREMITY WITH CONTRAST TECHNIQUE: Multidetector CT imaging of the upper left extremity was performed according to the standard protocol following intravenous contrast administration. COMPARISON:  None. CONTRAST:  121mL OMNIPAQUE IOHEXOL 300 MG/ML  SOLN FINDINGS: No bony abnormality. No bone destruction. No fracture, subluxation or dislocation. No focal fluid collection to suggest abscess. IMPRESSION: No evidence of focal fluid collection/abscess or osteomyelitis. Electronically Signed   By: Rolm Baptise M.D.   On: 08/14/2018 22:34    Pending Labs Unresulted Labs (From admission, onward)    Start     Ordered   08/14/18 2158  Blood Culture (routine x 2)  BLOOD CULTURE X 2,   STAT     08/14/18 2157   Signed and Held  HIV antibody (Routine Testing)   Once,   R     Signed and Held   Signed and Held  CBC  (enoxaparin (LOVENOX)    CrCl >/= 30 ml/min)  Once,   R    Comments: Baseline for enoxaparin therapy IF NOT ALREADY DRAWN.  Notify MD if PLT < 100 K.    Signed and Held   Signed and Held  Creatinine, serum  (enoxaparin (LOVENOX)    CrCl >/= 30 ml/min)  Once,   R    Comments: Baseline for enoxaparin therapy IF NOT ALREADY DRAWN.    Signed and Held   Signed and Held  Creatinine, serum  (enoxaparin (LOVENOX)    CrCl >/= 30 ml/min)  Weekly,   R    Comments: while on enoxaparin therapy    Signed and Held   Signed and Held  Basic metabolic panel  Tomorrow morning,   R     Signed and Held   Signed and Held  CBC  Tomorrow morning,   R     Signed and Held          Vitals/Pain Today's Vitals   08/14/18 1925 08/14/18 2122  BP: (!) 139/97   Pulse: (!) 112 100  Resp: 20   Temp: 99.9 F (37.7 C)   TempSrc: Oral   SpO2: 97% 100%  Weight: 63.5 kg   Height: 5\' 9"  (1.753 m)   PainSc: 10-Worst pain ever     Isolation Precautions Airborne and Contact precautions  Medications Medications  vancomycin (VANCOCIN) 1,500 mg in sodium chloride 0.9 % 500 mL IVPB (1,500 mg Intravenous New Bag/Given 08/14/18 2312)  morphine 4 MG/ML injection 4 mg (4 mg Intravenous Given 08/14/18 2235)  sodium chloride 0.9 % bolus 1,000 mL (0 mLs Intravenous Stopped 08/14/18 2212)  metroNIDAZOLE (FLAGYL) IVPB 500 mg (0 mg Intravenous Stopped 08/15/18 0011)  sodium chloride 0.9 % bolus 1,000 mL (1,000 mLs Intravenous Bolus 08/14/18 2229)  ceFEPIme (MAXIPIME) 2 g in sodium chloride 0.9 % 100 mL IVPB (0 g Intravenous Stopped 08/14/18 2308)  iohexol (OMNIPAQUE) 300 MG/ML solution 100 mL (100 mLs Intravenous Contrast Given 08/14/18 2211)  ondansetron [facility](ZOFRAN) injection 4 mg (4 mg Intravenous Given 08/14/18 2234)    Mobility walks Low fall risk   Focused Assessments skin   R Recommendations: See Admitting Provider Note  Report given to:   Additional Notes:

## 2018-08-15 NOTE — Progress Notes (Signed)
[employer] - [place] at [facility]   PATIENT NAME: Garrett HeftyOrlando [name]    MR#:  [id number]  DATE OF BIRTH:  16-Oct-1985  SUBJECTIVE:  CHIEF COMPLAINT:   Chief Complaint  Patient presents with   Wound Infection   -Patient type I diabetic, he does not check his fingersticks frequently at home, has been having significantly elevated blood sugars at home.  Also left forearm swelling and tenderness for a week.  REVIEW OF SYSTEMS:  Review of Systems  Constitutional: Negative for chills, fever and malaise/fatigue.  HENT: Negative for congestion, ear discharge, hearing loss and nosebleeds.   Eyes: Negative for blurred vision and double vision.  Respiratory: Negative for cough, shortness of breath and wheezing.   Cardiovascular: Negative for chest pain, palpitations and leg swelling.  Gastrointestinal: Negative for abdominal pain, constipation, diarrhea, nausea and vomiting.  Genitourinary: Negative for dysuria.  Musculoskeletal: Positive for joint pain and myalgias.  Neurological: Negative for dizziness, focal weakness, seizures, weakness and headaches.  Psychiatric/Behavioral: Negative for depression.    DRUG ALLERGIES:  No Known Allergies  VITALS:  Blood pressure (!) 151/92, pulse 92, temperature 98 F (36.7 C), resp. rate 16, height 5\' 9"  (1.753 m), weight 74.3 kg, SpO2 100 %.  PHYSICAL EXAMINATION:  Physical Exam   GENERAL:  33 y.o.-year-old patient lying in the bed with no acute distress.  EYES: Pupils equal, round, reactive to light and accommodation. No scleral icterus. Extraocular muscles intact.  HEENT: Head atraumatic, normocephalic. Oropharynx and nasopharynx clear.  NECK:  Supple, no jugular venous distention. No thyroid enlargement, no tenderness.  LUNGS: Normal breath sounds bilaterally, no wheezing, rales,rhonchi or crepitation. No use of accessory muscles of respiration.  CARDIOVASCULAR: S1, S2 normal. No murmurs, rubs, or gallops.  ABDOMEN: Soft,  nontender, nondistended. Bowel sounds present. No organomegaly or mass.  EXTREMITIES: left fore arm, swollen, tender to touch- still has good motor and sensory function intact.  No pedal edema, cyanosis, or clubbing.  NEUROLOGIC: Cranial nerves II through XII are intact. Muscle strength 5/5 in all extremities. Sensation intact. Gait not checked.  PSYCHIATRIC: The patient is alert and oriented x 3.  SKIN: No obvious rash, lesion, or ulcer.        LABORATORY PANEL:   CBC Recent Labs  Lab 08/15/18 0655  WBC 12.5*  HGB 10.6*  HCT 33.3*  PLT 374   ------------------------------------------------------------------------------------------------------------------  Chemistries  Recent Labs  Lab 08/14/18 1928 08/15/18 0655  NA 131* 127*  K 3.8 4.7  CL 92* 94*  CO2 25 22  GLUCOSE 663* 569*  BUN 16 14  CREATININE 0.94 0.77  CALCIUM 8.8* 8.3*  AST 19  --   ALT 18  --   ALKPHOS 180*  --   BILITOT 0.2*  --    ------------------------------------------------------------------------------------------------------------------  Cardiac Enzymes No results for input(s): TROPONINI in the last 168 hours. ------------------------------------------------------------------------------------------------------------------  RADIOLOGY:  Ct Forearm Left W Contrast  Result Date: 08/14/2018 CLINICAL DATA:  Left arm swelling, pain.  Concern for abscess. EXAM: CT OF THE UPPER LEFT EXTREMITY WITH CONTRAST TECHNIQUE: Multidetector CT imaging of the upper left extremity was performed according to the standard protocol following intravenous contrast administration. COMPARISON:  None. CONTRAST:  100mL OMNIPAQUE IOHEXOL 300 MG/ML  SOLN FINDINGS: No bony abnormality. No bone destruction. No fracture, subluxation or dislocation. No focal fluid collection to suggest abscess. IMPRESSION: No evidence of focal fluid collection/abscess or osteomyelitis. Electronically Signed   By: Charlett NoseKevin  Dover M.D.   On:  08/14/2018 22:34  EKG:   Orders placed or performed during the hospital encounter of 03/27/17   EKG 12-Lead   EKG 12-Lead   EKG 12-Lead   EKG 12-Lead   EKG    ASSESSMENT AND PLAN:   33 year old male who is type I diabetic, no physician as outpatient presents to hospital secondary to elevated blood sugars and left forearm swelling and tenderness.  1.  Left forearm cellulitis-patient has some folliculitis changes on the forearm that could have started the infection. -No compartment syndrome. -Continue antibiotics.  Change to IV Ancef  2.  Uncontrolled type 1 diabetes mellitus-no physician outside.  Check A1c -Uses over-the-counter mixed insulin.  Sugars are elevated. -Increase dose to 40 units twice daily and pre-meal insulin and sliding scale insulin for now -Diabetes coordinator consult  3.  Hyponatremia-secondary to elevated blood sugars.  IV fluids and monitor  4.  DVT prophylaxis-Lovenox     All the records are reviewed and case discussed with Care Management/Social Workerr. Management plans discussed with the patient, family and they are in agreement.  CODE STATUS: Full code  TOTAL TIME TAKING CARE OF THIS PATIENT: 39 minutes.   POSSIBLE D/C IN 3 DAYS, DEPENDING ON CLINICAL CONDITION.   Gladstone Lighter M.D on 08/15/2018 at 11:02 AM  Between 7am to 6pm - Pager - [phone]  After 6pm go to [email or web address] - password [email or web address] [facility]  Office  [phone]  CC: Primary care physician; Patient, No Pcp Per

## 2018-08-15 NOTE — Progress Notes (Signed)
Pt sitting up on side bed. No further complaints voiced. Skin warm and dry.

## 2018-08-15 NOTE — Progress Notes (Signed)
Pharmacy Antibiotic Note  Garrett [name] is a 33 y.o. male admitted on 08/14/2018 with cellulitis.  Pharmacy has been consulted for vanc/cefepime dosing. Patient received vanc 1.5g IV load, cefepime 2g, and flagyl 500 mg IV x 1 in ED for a forearm cellulitis.  Plan: Vancomycin 1250 mg IV Q 12 hrs. Goal AUC 400-550. Expected AUC: 489.4 SCr used: 0.94 mg/dL Cssmin: 12.6 mcg/mL  Will continue cefepime 2g IV q8h as well as flagyl 500 mg IV q8h per sepsis orderset. Will continue to monitor renal fx and s/sx of infx for appropriate de-escalation.  Height: 5\' 9"  (175.3 cm) Weight: 163 lb 14.4 oz (74.3 kg) IBW/kg (Calculated) : 70.7  Temp (24hrs), Avg:99 F (37.2 C), Min:98.1 F (36.7 C), Max:99.9 F (37.7 C)  Recent Labs  Lab 08/14/18 1928 08/14/18 2212  WBC 10.4  --   CREATININE 0.94  --   LATICACIDVEN  --  1.8    Estimated Creatinine Clearance: 109.7 mL/min (by C-G formula based on SCr of 0.94 mg/dL).    No Known Allergies  Thank you for allowing pharmacy to be a part of this patient's care.  Tobie Lords, PharmD, BCPS Clinical Pharmacist 08/15/2018 5:24 AM

## 2018-08-15 NOTE — H&P (Signed)
[facility] [place]    MR#:  [id number]  DATE OF BIRTH:  1985/06/25  DATE OF ADMISSION:  08/14/2018  PRIMARY CARE PHYSICIAN: Patient, No Pcp Per   REQUESTING/REFERRING PHYSICIAN: Quentin Cornwall, MD  CHIEF COMPLAINT:   Chief Complaint  Patient presents with  . Wound Infection    HISTORY OF PRESENT ILLNESS:  Garrett [name]  is a 33 y.o. male who presents with chief complaint as above.  Patient complains of increasing left arm pain and swelling for the past several days.  Evaluation here in the ED is consistent with cellulitis.  He also has significantly elevated glucose here, though he is not in DKA.  Hospitalist were called for admission  PAST MEDICAL HISTORY:   Past Medical History:  Diagnosis Date  . Compression fracture of C-spine ([place])   . Type 1 diabetes ([place])      PAST SURGICAL HISTORY:   Past Surgical History:  Procedure Laterality Date  . NO PAST SURGERIES       SOCIAL HISTORY:   Social History   Tobacco Use  . Smoking status: Current Every Day Smoker    Packs/day: 0.50    Types: Cigarettes  . Smokeless tobacco: Never Used  Substance Use Topics  . Alcohol use: Yes    Comment: occasionally     FAMILY HISTORY:    Family history reviewed and is non-contributory DRUG ALLERGIES:  No Known Allergies  MEDICATIONS AT HOME:   Prior to Admission medications   Medication Sig Start Date End Date Taking? Authorizing Provider  insulin aspart (NOVOLOG) 100 UNIT/ML injection Inject 5 Units into the skin 3 (three) times daily with meals. Uses sliding scale - max 14u at any dose 03/29/17   Max Sane, MD  insulin aspart protamine- aspart (NOVOLOG MIX 70/30) (70-30) 100 UNIT/ML injection Inject 0.3 mLs (30 Units total) into the skin 2 (two) times daily. 03/29/17   Max Sane, MD  loperamide (IMODIUM A-D) 2 MG tablet Take 1 tablet (2 mg total) by mouth 4 (four) times daily as needed for diarrhea or  loose stools. 04/08/18   Lavonia Drafts, MD    REVIEW OF SYSTEMS:  Review of Systems  Constitutional: Positive for fever. Negative for chills, malaise/fatigue and weight loss.  HENT: Negative for ear pain, hearing loss and tinnitus.   Eyes: Negative for blurred vision, double vision, pain and redness.  Respiratory: Negative for cough, hemoptysis and shortness of breath.   Cardiovascular: Negative for chest pain, palpitations, orthopnea and leg swelling.  Gastrointestinal: Negative for abdominal pain, constipation, diarrhea, nausea and vomiting.  Genitourinary: Negative for dysuria, frequency and hematuria.  Musculoskeletal: Negative for back pain, joint pain and neck pain.  Skin:       Left arm pain and swelling  Neurological: Negative for dizziness, tremors, focal weakness and weakness.  Endo/Heme/Allergies: Negative for polydipsia. Does not bruise/bleed easily.  Psychiatric/Behavioral: Negative for depression. The patient is not nervous/anxious and does not have insomnia.      VITAL SIGNS:   Vitals:   08/14/18 1925 08/14/18 2122  BP: (!) 139/97   Pulse: (!) 112 100  Resp: 20   Temp: 99.9 F (37.7 C)   TempSrc: Oral   SpO2: 97% 100%  Weight: 63.5 kg   Height: 5\' 9"  (1.753 m)    Wt Readings from Last 3 Encounters:  08/14/18 63.5 kg  04/08/18 65.8 kg  03/27/17 58.8 kg    PHYSICAL EXAMINATION:  Physical Exam  Vitals reviewed. Constitutional: He is oriented to person, place, and time. He appears well-developed and well-nourished. No distress.  HENT:  Head: Normocephalic and atraumatic.  Mouth/Throat: Oropharynx is clear and moist.  Eyes: Pupils are equal, round, and reactive to light. Conjunctivae and EOM are normal. No scleral icterus.  Neck: Normal range of motion. Neck supple. No JVD present. No thyromegaly present.  Cardiovascular: Normal rate, regular rhythm and intact distal pulses. Exam reveals no gallop and no friction rub.  No murmur heard. Respiratory: Effort  normal and breath sounds normal. No respiratory distress. He has no wheezes. He has no rales.  GI: Soft. Bowel sounds are normal. He exhibits no distension. There is no abdominal tenderness.  Musculoskeletal: Normal range of motion.        General: No edema.     Comments: No arthritis, no gout  Lymphadenopathy:    He has no cervical adenopathy.  Neurological: He is alert and oriented to person, place, and time. No cranial nerve deficit.  No dysarthria, no aphasia  Skin: Skin is warm and dry. No rash noted. There is erythema (left arm, with swelling, warmth and tenderness).  Psychiatric: He has a normal mood and affect. His behavior is normal. Judgment and thought content normal.    LABORATORY PANEL:   CBC Recent Labs  Lab 08/14/18 1928  WBC 10.4  HGB 11.4*  HCT 35.7*  PLT 422*   ------------------------------------------------------------------------------------------------------------------  Chemistries  Recent Labs  Lab 08/14/18 1928  NA 131*  K 3.8  CL 92*  CO2 25  GLUCOSE 663*  BUN 16  CREATININE 0.94  CALCIUM 8.8*  AST 19  ALT 18  ALKPHOS 180*  BILITOT 0.2*   ------------------------------------------------------------------------------------------------------------------  Cardiac Enzymes No results for input(s): TROPONINI in the last 168 hours. ------------------------------------------------------------------------------------------------------------------  RADIOLOGY:  Ct Forearm Left W Contrast  Result Date: 08/14/2018 CLINICAL DATA:  Left arm swelling, pain.  Concern for abscess. EXAM: CT OF THE UPPER LEFT EXTREMITY WITH CONTRAST TECHNIQUE: Multidetector CT imaging of the upper left extremity was performed according to the standard protocol following intravenous contrast administration. COMPARISON:  None. CONTRAST:  100mL OMNIPAQUE IOHEXOL 300 MG/ML  SOLN FINDINGS: No bony abnormality. No bone destruction. No fracture, subluxation or dislocation. No focal  fluid collection to suggest abscess. IMPRESSION: No evidence of focal fluid collection/abscess or osteomyelitis. Electronically Signed   By: Charlett NoseKevin  Dover M.D.   On: 08/14/2018 22:34    EKG:   Orders placed or performed during the hospital encounter of 03/27/17  . EKG 12-Lead  . EKG 12-Lead  . EKG 12-Lead  . EKG 12-Lead  . EKG    IMPRESSION AND PLAN:  Principal Problem:   Left arm cellulitis -IV antibiotics Active Problems:   Diabetes mellitus with hyperglycemia (HCC) -home dose insulin plus sliding scale insulin  Chart review performed and case discussed with ED provider. Labs, imaging and/or ECG reviewed by provider and discussed with patient/family. Management plans discussed with the patient and/or family.  COVID-19 status: Tested negative     DVT PROPHYLAXIS: SubQ lovenox   GI PROPHYLAXIS:  None  ADMISSION STATUS: Observation  CODE STATUS: Full Code Status History    Date Active Date Inactive Code Status Order ID Comments User Context   03/27/2017 1321 03/29/2017 1737 Full Code [id number]  Auburn BilberryPatel, Shreyang, MD Inpatient   Advance Care Planning Activity      TOTAL TIME TAKING CARE OF THIS PATIENT: 40 minutes.   This patient was evaluated in the context of the global COVID-19  pandemic, which necessitated consideration that the patient might be at risk for infection with the SARS-CoV-2 virus that causes COVID-19. Institutional protocols and algorithms that pertain to the evaluation of patients at risk for COVID-19 are in a state of rapid change based on information released by regulatory bodies including the CDC and federal and state organizations. These policies and algorithms were followed to the best of this provider's knowledge to date during the patient's care at this facility.  Barney DrainDavid F Neelah Mannings 08/15/2018, 12:40 AM  [facility]  Office  [phone]  CC: Primary care physician; Patient, No Pcp Per  Note:  This document was prepared using Dragon voice  recognition software and may include unintentional dictation errors.

## 2018-08-15 NOTE — Progress Notes (Signed)
Patient had decided to leave against medical advice. MD notified. Patient informed that his infection will not get better without medical intervention, and pain will most like increase. Patient also offered a nicotine patch as this Probation officer thought maybe that was the reason he wanted to leave. Patient signed paper to leave against medical advice.

## 2018-08-15 NOTE — Progress Notes (Signed)
Inpatient Diabetes Program Recommendations  AACE/ADA: New Consensus Statement on Inpatient Glycemic Control (2015)  Target Ranges:  Prepandial:   less than 140 mg/dL      Peak postprandial:   less than 180 mg/dL (1-2 hours)      Critically ill patients:  140 - 180 mg/dL   Lab Results  Component Value Date   GLUCAP 436 (H) 08/15/2018   HGBA1C 12.6 (H) 03/28/2017    Review of Glycemic Control Results for LEWIN, PELLOW (MRN [id number]) as of 08/15/2018 11:34  Ref. Range 08/14/2018 21:10 08/14/2018 21:47 08/15/2018 01:14 08/15/2018 07:32 08/15/2018 09:13  Glucose-Capillary Latest Ref Range: 70 - 99 mg/dL 345 (H) 208 (H) 181 (H) 540 (HH) 436 (H)   Diabetes history: DM1 Outpatient Diabetes medications: Novolin 70/30 insulin mix 30 units bid + Novolin R 5 units tid meal coverage  Current orders for Inpatient glycemic control: Novolog 70/30 insulin mix 40 units bid + Novolog 5 units tid meal coverage if eats 50% +  Novolog sensitive correcton tid + hs 0-5 units  Inpatient Diabetes Program Recommendations:   Spoke with patient and reviewed what insulin he is currently taking. Patient has not been picking up prescriptions due to his Medicaid is in [place] and has not transferred to [facility] yet. Consult sent to Transitions of Care regarding Medicaid transfer to [place]. Patient states he has been taking his insulin "most of the time" but trying to stretch out insulin due to cost. Patient states he has money to currently purchase insulin on discharge from [facility] since his Medicaid is not current in [place]. Noted pending A1c and reviewed with patient risks with elevated A1c. Patient admits to drinking some drinks with sugar and reviewed content of sugar in soft drinks that he has been drinking. Also reviewed hypoglycemia protocol with verbal return instructions by patient. Will follow.  Thank you, Nani Gasser. Dorsey Charette, RN, MSN, CDE  Diabetes Coordinator Inpatient Glycemic Control Team Team Pager [phone]  (8am-5pm) 08/15/2018 11:48 AM

## 2018-08-15 NOTE — Progress Notes (Signed)
Pt c/o feeling light headed. Pt diaphoretic. Pt has eaten [place] sandwich, juice, mild. Glucose 23. D50 given IV. Dr.Sudini notified. No orders received at this time.

## 2018-08-15 NOTE — TOC Initial Note (Signed)
Transition of Care [facility]) - Initial/Assessment Note    Patient Details  Name: Garrett [name] MRN: [id number] Date of Birth: 11/01/1985  [facility] CM/SW Contact:    Su Hilt, RN Phone Number: 08/15/2018, 11:33 AM  Clinical Narrative:                 Met with the patient to discuss DC plan and needs, he lives with a friend and uses [employer] for transportation, he checks his BS at home and has a glucometer, I provided him with the [facility] for [facility] and sent the referral to [facility]or[facility], she accepted the referral, will continue to monitor for Home med needs as the patient has no insurance.  Will send med needs to med mgt once determines what the meds will be  Expected Discharge Plan: Home/Self Care Barriers to Discharge: Continued Medical Work up   Patient Goals and CMS Choice        Expected Discharge Plan and Services Expected Discharge Plan: Home/Self Care   Discharge Planning Services: CM Consult   Living arrangements for the past 2 months: Single Family Home                                      Prior Living Arrangements/Services Living arrangements for the past 2 months: Single Family Home Lives with:: Friends Patient language and need for interpreter reviewed:: No Do you feel safe going back to the place where you live?: Yes      Need for Family Participation in Patient Care: No (Comment) Care giver support system in place?: Yes (comment)   Criminal Activity/Legal Involvement Pertinent to Current Situation/Hospitalization: No - Comment as needed  Activities of Daily Living Home Assistive Devices/Equipment: None ADL Screening (condition at time of admission) Patient's cognitive ability adequate to safely complete daily activities?: Yes Is the patient deaf or have difficulty hearing?: No Does the patient have difficulty seeing, even when wearing glasses/contacts?: No Does the patient have difficulty concentrating, remembering,  or making decisions?: No Patient able to express need for assistance with ADLs?: Yes Does the patient have difficulty dressing or bathing?: No Independently performs ADLs?: Yes (appropriate for developmental age) Does the patient have difficulty walking or climbing stairs?: No Weakness of Legs: None Weakness of Arms/Hands: Left  Permission Sought/Granted                  Emotional Assessment Appearance:: Appears stated age Attitude/Demeanor/Rapport: Engaged Affect (typically observed): Accepting Orientation: : Oriented to Self, Oriented to Place, Oriented to  Time, Oriented to Situation Alcohol / Substance Use: Not Applicable Psych Involvement: No (comment)  Admission diagnosis:  Hyperglycemia [R73.9] Cellulitis of left upper extremity [L03.114] Patient Active Problem List   Diagnosis Date Noted  . Left arm cellulitis 08/15/2018  . Diabetes mellitus with hyperglycemia ([place]) 08/15/2018  . DKA (diabetic ketoacidoses) ([place]) 03/27/2017   PCP:  Patient, No Pcp Per Pharmacy:   [facility] [address] [place] [facility] [place]) [place] [address] Phone: [phone] Fax: [phone]     Social Determinants of Health (SDOH) Interventions    Readmission Risk Interventions No flowsheet data found.

## 2018-08-16 LAB — HIV ANTIBODY (ROUTINE TESTING W REFLEX): HIV Screen 4th Generation wRfx: NONREACTIVE

## 2018-08-18 ENCOUNTER — Other Ambulatory Visit: Payer: Self-pay

## 2018-08-18 ENCOUNTER — Emergency Department
Admission: EM | Admit: 2018-08-18 | Discharge: 2018-08-18 | Disposition: A | Discharge status: Home / Self Care | Payer: Medicaid - Out of State | Attending: Emergency Medicine | Admitting: Emergency Medicine

## 2018-08-18 ENCOUNTER — Encounter: Payer: Self-pay | Admitting: Emergency Medicine

## 2018-08-18 DIAGNOSIS — M25522 Pain in left elbow: Secondary | ICD-10-CM | POA: Insufficient documentation

## 2018-08-18 DIAGNOSIS — Z5321 Procedure and treatment not carried out due to patient leaving prior to being seen by health care provider: Secondary | ICD-10-CM | POA: Diagnosis not present

## 2018-08-18 LAB — GLUCOSE, CAPILLARY: Glucose-Capillary: 522 mg/dL (ref 70–99)

## 2018-08-18 NOTE — ED Provider Notes (Signed)
Patient left the ER prior to my assessment   Nance Pear, MD 08/18/18 [phone]

## 2018-08-18 NOTE — ED Triage Notes (Signed)
States was in the hospital for IV antibiotics for infection L elbow. States signed out [facility] July 1st. States is returning due to continued swelling in elbow.

## 2018-08-18 NOTE — ED Notes (Addendum)
Patient received phone call, got up, and left. No explanation given. Patient left without signing AMA form.

## 2018-08-19 LAB — CULTURE, BLOOD (ROUTINE X 2)
Culture: NO GROWTH
Culture: NO GROWTH
Special Requests: ADEQUATE

## 2018-08-20 MED ORDER — INSULIN NPH ISOPHANE & REGULAR (70-30) 100 UNIT/ML ~~LOC~~ SUSP
30.00 | SUBCUTANEOUS | Status: DC
Start: 2018-08-20 — End: 2018-08-20

## 2018-08-20 MED ORDER — ACETAMINOPHEN 325 MG PO TABS
650.00 | ORAL_TABLET | ORAL | Status: DC
Start: ? — End: 2018-08-20

## 2018-08-20 MED ORDER — GLUCOSE 40 % PO GEL
ORAL | Status: DC
Start: ? — End: 2018-08-20

## 2018-08-20 MED ORDER — CEFAZOLIN SODIUM-DEXTROSE 1-4 GM-%(50ML) IV SOLR
1.00 | INTRAVENOUS | Status: DC
Start: 2018-08-20 — End: 2018-08-20

## 2018-08-20 MED ORDER — INSULIN REGULAR HUMAN 100 UNIT/ML IJ SOLN
0.00 | INTRAMUSCULAR | Status: DC
Start: 2018-08-20 — End: 2018-08-20

## 2018-08-20 MED ORDER — ENOXAPARIN SODIUM 40 MG/0.4ML ~~LOC~~ SOLN
40.00 | SUBCUTANEOUS | Status: DC
Start: 2018-08-21 — End: 2018-08-20

## 2018-08-20 MED ORDER — DOXYCYCLINE HYCLATE 100 MG PO TABS
100.00 | ORAL_TABLET | ORAL | Status: DC
Start: 2018-08-20 — End: 2018-08-20

## 2018-08-20 MED ORDER — DEXTROSE 50 % IV SOLN
12.50 | INTRAVENOUS | Status: DC
Start: ? — End: 2018-08-20

## 2018-08-20 MED ORDER — NICOTINE POLACRILEX 4 MG MT GUM
4.00 | CHEWING_GUM | OROMUCOSAL | Status: DC
Start: ? — End: 2018-08-20

## 2018-08-20 MED ORDER — NICOTINE 21 MG/24HR TD PT24
1.00 | MEDICATED_PATCH | TRANSDERMAL | Status: DC
Start: 2018-08-21 — End: 2018-08-20

## 2018-08-20 MED ORDER — LISINOPRIL 5 MG PO TABS
5.00 | ORAL_TABLET | ORAL | Status: DC
Start: 2018-08-21 — End: 2018-08-20

## 2018-09-03 NOTE — Discharge Summary (Signed)
[facility] NAME: Garrett [name]    MR#:  [id number]  DATE OF BIRTH:  Jun 01, 1985  DATE OF ADMISSION:  08/14/2018   ADMITTING PHYSICIAN: Lance Coon, MD  DATE OF DISCHARGE: 08/15/2018  9:23 PM LEFT AMA  PRIMARY CARE PHYSICIAN: Patient, No Pcp Per   ADMISSION DIAGNOSIS:   Hyperglycemia [R73.9] Cellulitis of left upper extremity [R48.546]  DISCHARGE DIAGNOSIS:   Principal Problem:   Left arm cellulitis Active Problems:   Diabetes mellitus with hyperglycemia ([place])   SECONDARY DIAGNOSIS:   Past Medical History:  Diagnosis Date  . Compression fracture of C-spine ([place])   . Type 1 diabetes [facility])     HOSPITAL COURSE:   33 year old male who is type I diabetic, no physician as outpatient presents to hospital secondary to elevated blood sugars and left forearm swelling and tenderness.  1.  Left forearm cellulitis-patient has had folliculitis changes on the forearm that could have started the infection. -No compartment syndrome on admission. -He was started on IV ABX with ancef since there were no open wounds - Plan was to keep him on IV ABX until improvement noted and then discharge on oral antibiotics. However patient left AMA  2.  Uncontrolled type 1 diabetes mellitus-no physician outside.  pending A1c -Uses over-the-counter mixed insulin.  Sugars are elevated. -Increased dose to 40 units twice daily and pre-meal insulin and sliding scale insulin for now -Diabetes coordinator consult was requested- but patient left AMA  3.  Hyponatremia-secondary to elevated blood sugars. Received  IV fluids   PATIENT LEFT AMA  DISCHARGE CONDITIONS:   Guarded  CONSULTS OBTAINED:   None  DRUG ALLERGIES:   No Known Allergies DISCHARGE MEDICATIONS:   Allergies as of 08/15/2018   No Known Allergies     Medication List    You have not been prescribed any medications.      DISCHARGE INSTRUCTIONS:   PATIENT LEFT AMA on  08/15/2018  If you experience worsening of your admission symptoms, develop shortness of breath, life threatening emergency, suicidal or homicidal thoughts you must seek medical attention immediately by calling 911 or calling your MD immediately  if symptoms less severe.  You Must read complete instructions/literature along with all the possible adverse reactions/side effects for all the Medicines you take and that have been prescribed to you. Take any new Medicines after you have completely understood and accpet all the possible adverse reactions/side effects.   Please note  You were cared for by a hospitalist during your hospital stay. If you have any questions about your discharge medications or the care you received while you were in the hospital after you are discharged, you can call the unit and asked to speak with the hospitalist on call if the hospitalist that took care of you is not available. Once you are discharged, your primary care physician will handle any further medical issues. Please note that NO REFILLS for any discharge medications will be authorized once you are discharged, as it is imperative that you return to your primary care physician (or establish a relationship with a primary care physician if you do not have one) for your aftercare needs so that they can reassess your need for medications and monitor your lab values.    On the day of Discharge:  VITAL SIGNS:   Blood pressure (!) 147/106, pulse 88, temperature 98.3 F (36.8 C), resp. rate 18, height 5\' 9"  (1.753 m), weight 74.3 kg, SpO2 100 %.  PHYSICAL EXAMINATION:    GENERAL:  33 y.o.-year-old patient lying in the bed with no acute distress.  EYES: Pupils equal, round, reactive to light and accommodation. No scleral icterus. Extraocular muscles intact.  HEENT: Head atraumatic, normocephalic. Oropharynx and nasopharynx clear.  NECK:  Supple, no jugular venous distention. No thyroid enlargement, no tenderness.   LUNGS: Normal breath sounds bilaterally, no wheezing, rales,rhonchi or crepitation. No use of accessory muscles of respiration.  CARDIOVASCULAR: S1, S2 normal. No murmurs, rubs, or gallops.  ABDOMEN: Soft, nontender, nondistended. Bowel sounds present. No organomegaly or mass.  EXTREMITIES: left fore arm, swollen, tender to touch- still has good motor and sensory function intact.  No pedal edema, cyanosis, or clubbing.  NEUROLOGIC: Cranial nerves II through XII are intact. Muscle strength 5/5 in all extremities. Sensation intact. Gait not checked.  PSYCHIATRIC: The patient is alert and oriented x 3.  SKIN: No obvious rash, lesion, or ulcer.       DATA REVIEW:   CBC No results for input(s): WBC, HGB, HCT, PLT in the last 168 hours.  Chemistries  No results for input(s): NA, K, CL, CO2, GLUCOSE, BUN, CREATININE, CALCIUM, MG, AST, ALT, ALKPHOS, BILITOT in the last 168 hours.  Invalid input(s): GFRCGP   Microbiology Results  Results for orders placed or performed during the hospital encounter of 08/14/18  Blood Culture (routine x 2)     Status: None   Collection Time: 08/14/18 10:12 PM   Specimen: BLOOD  Result Value Ref Range Status   Specimen Description BLOOD BLOOD RIGHT HAND  Final   Special Requests   Final    BOTTLES DRAWN AEROBIC AND ANAEROBIC Blood Culture adequate volume   Culture   Final    NO GROWTH 5 DAYS Performed at [facility], [address] [place], [place] [address]    Report Status 08/19/2018 FINAL  Final  Blood Culture (routine x 2)     Status: None   Collection Time: 08/14/18 10:12 PM   Specimen: BLOOD  Result Value Ref Range Status   Specimen Description BLOOD RIGHT ANTECUBITAL  Final   Special Requests   Final    BOTTLES DRAWN AEROBIC AND ANAEROBIC Blood Culture results may not be optimal due to an inadequate volume of blood received in culture bottles   Culture   Final    NO GROWTH 5 DAYS Performed at [facility], [address] [place], [place] [address]    Report Status 08/19/2018 FINAL  Final  SARS Coronavirus 2 (CEPHEID- Performed in [facility] hospital lab), Hosp Order     Status: None   Collection Time: 08/14/18 10:36 PM   Specimen: Nasopharyngeal Swab  Result Value Ref Range Status   SARS Coronavirus 2 NEGATIVE NEGATIVE Final    Comment: (NOTE) If result is NEGATIVE SARS-CoV-2 target nucleic acids are NOT DETECTED. The SARS-CoV-2 RNA is generally detectable in upper and lower  respiratory specimens during the acute phase of infection. The lowest  concentration of SARS-CoV-2 viral copies this assay can detect is 250  copies / mL. A negative result does not preclude SARS-CoV-2 infection  and should not be used as the sole basis for treatment or other  patient management decisions.  A negative result may occur with  improper specimen collection / handling, submission of specimen other  than nasopharyngeal swab, presence of viral mutation(s) within the  areas targeted by this assay, and inadequate number of viral copies  (<250 copies / mL). A negative result must be  combined with clinical  observations, patient history, and epidemiological information. If result is POSITIVE SARS-CoV-2 target nucleic acids are DETECTED. The SARS-CoV-2 RNA is generally detectable in upper and lower  respiratory specimens dur ing the acute phase of infection.  Positive  results are indicative of active infection with SARS-CoV-2.  Clinical  correlation with patient history and other diagnostic information is  necessary to determine patient infection status.  Positive results do  not rule out bacterial infection or co-infection with other viruses. If result is PRESUMPTIVE POSTIVE SARS-CoV-2 nucleic acids MAY BE PRESENT.   A presumptive positive result was obtained on the submitted specimen  and confirmed on repeat testing.  While 2019 novel coronavirus  (SARS-CoV-2) nucleic acids may be present in the submitted sample   additional confirmatory testing may be necessary for epidemiological  and / or clinical management purposes  to differentiate between  SARS-CoV-2 and other Sarbecovirus currently known to infect humans.  If clinically indicated additional testing with an alternate test  methodology [phone]([id number]) is advised. The SARS-CoV-2 RNA is generally  detectable in upper and lower respiratory sp ecimens during the acute  phase of infection. The expected result is Negative. Fact Sheet for Patients:  BoilerBrush.com.cyhttps://www.fda.gov/media/136312/download Fact Sheet for Healthcare Providers: https://pope.com/https://www.fda.gov/media/136313/download This test is not yet approved or cleared by the [place] FDA and has been authorized for detection and/or diagnosis of SARS-CoV-2 by FDA under an Emergency Use Authorization (EUA).  This EUA will remain in effect (meaning this test can be used) for the duration of the COVID-19 declaration under Section 564(b)(1) of the Act, 21 U.S.C. section 360bbb-3(b)(1), unless the authorization is terminated or revoked sooner. Performed at [facility], [address] [place], [place] [address]     RADIOLOGY:  No results found.   Management plans discussed with the patient, family and they are in agreement.  CODE STATUS:  Code Status History    Date Active Date Inactive Code Status Order ID Comments User Context   08/15/2018 0114 08/16/2018 0139 Full Code [id number]  Oralia ManisWillis, David, MD Inpatient   03/27/2017 1321 03/29/2017 1737 Full Code [id number]  Aub[place] BilberryPatel, Shreyang, MD Inpatient   Advance Care Planning Activity      TOTAL TIME TAKING CARE OF THIS PATIENT: 35 minutes.    Enid Baasadhika Chynna Buerkle M.D on 09/03/2018 at 12:42 PM  Between 7am to 6pm - Pager - [phone]  After 6pm go to [email or web address] - Social research officer, governmentpassword EPAS [facility]  [facility] [place] [facility]  Office  [phone]  CC: Primary care physician; Patient, No Pcp Per   Note: This dictation was prepared with Dragon  dictation along with smaller phrase technology. Any transcriptional errors that result from this process are unintentional.

## 2019-04-07 ENCOUNTER — Encounter (HOSPITAL_COMMUNITY): Payer: Self-pay

## 2019-04-07 ENCOUNTER — Emergency Department (HOSPITAL_COMMUNITY): Payer: Medicaid - Out of State

## 2019-04-07 ENCOUNTER — Other Ambulatory Visit: Payer: Self-pay

## 2019-04-07 ENCOUNTER — Inpatient Hospital Stay (HOSPITAL_COMMUNITY)
Admission: EM | Admit: 2019-04-07 | Discharge: 2019-04-11 | DRG: 854 | Disposition: A | Discharge status: Home / Self Care | Payer: Medicaid - Out of State | Attending: Internal Medicine | Admitting: Internal Medicine

## 2019-04-07 DIAGNOSIS — E871 Hypo-osmolality and hyponatremia: Secondary | ICD-10-CM | POA: Diagnosis not present

## 2019-04-07 DIAGNOSIS — A419 Sepsis, unspecified organism: Secondary | ICD-10-CM | POA: Diagnosis present

## 2019-04-07 DIAGNOSIS — E1069 Type 1 diabetes mellitus with other specified complication: Secondary | ICD-10-CM | POA: Diagnosis present

## 2019-04-07 DIAGNOSIS — E11628 Type 2 diabetes mellitus with other skin complications: Secondary | ICD-10-CM | POA: Diagnosis not present

## 2019-04-07 DIAGNOSIS — E1065 Type 1 diabetes mellitus with hyperglycemia: Secondary | ICD-10-CM | POA: Diagnosis present

## 2019-04-07 DIAGNOSIS — M869 Osteomyelitis, unspecified: Secondary | ICD-10-CM | POA: Diagnosis present

## 2019-04-07 DIAGNOSIS — E10628 Type 1 diabetes mellitus with other skin complications: Secondary | ICD-10-CM | POA: Diagnosis present

## 2019-04-07 DIAGNOSIS — L02611 Cutaneous abscess of right foot: Secondary | ICD-10-CM | POA: Diagnosis present

## 2019-04-07 DIAGNOSIS — L03115 Cellulitis of right lower limb: Secondary | ICD-10-CM | POA: Diagnosis not present

## 2019-04-07 DIAGNOSIS — E10649 Type 1 diabetes mellitus with hypoglycemia without coma: Secondary | ICD-10-CM | POA: Diagnosis not present

## 2019-04-07 DIAGNOSIS — E876 Hypokalemia: Secondary | ICD-10-CM | POA: Diagnosis not present

## 2019-04-07 DIAGNOSIS — Z9111 Patient's noncompliance with dietary regimen: Secondary | ICD-10-CM

## 2019-04-07 DIAGNOSIS — Z794 Long term (current) use of insulin: Secondary | ICD-10-CM

## 2019-04-07 DIAGNOSIS — I10 Essential (primary) hypertension: Secondary | ICD-10-CM | POA: Diagnosis present

## 2019-04-07 DIAGNOSIS — E1042 Type 1 diabetes mellitus with diabetic polyneuropathy: Secondary | ICD-10-CM | POA: Diagnosis present

## 2019-04-07 DIAGNOSIS — L03119 Cellulitis of unspecified part of limb: Secondary | ICD-10-CM | POA: Diagnosis not present

## 2019-04-07 DIAGNOSIS — F17210 Nicotine dependence, cigarettes, uncomplicated: Secondary | ICD-10-CM | POA: Diagnosis present

## 2019-04-07 DIAGNOSIS — D72829 Elevated white blood cell count, unspecified: Secondary | ICD-10-CM | POA: Diagnosis not present

## 2019-04-07 DIAGNOSIS — E860 Dehydration: Secondary | ICD-10-CM | POA: Diagnosis present

## 2019-04-07 DIAGNOSIS — L98498 Non-pressure chronic ulcer of skin of other sites with other specified severity: Secondary | ICD-10-CM | POA: Diagnosis present

## 2019-04-07 DIAGNOSIS — E10621 Type 1 diabetes mellitus with foot ulcer: Secondary | ICD-10-CM | POA: Diagnosis present

## 2019-04-07 DIAGNOSIS — E1165 Type 2 diabetes mellitus with hyperglycemia: Secondary | ICD-10-CM | POA: Diagnosis present

## 2019-04-07 DIAGNOSIS — I82409 Acute embolism and thrombosis of unspecified deep veins of unspecified lower extremity: Secondary | ICD-10-CM | POA: Diagnosis not present

## 2019-04-07 DIAGNOSIS — L03031 Cellulitis of right toe: Secondary | ICD-10-CM | POA: Diagnosis present

## 2019-04-07 DIAGNOSIS — L0390 Cellulitis, unspecified: Secondary | ICD-10-CM | POA: Diagnosis present

## 2019-04-07 DIAGNOSIS — Z20822 Contact with and (suspected) exposure to covid-19: Secondary | ICD-10-CM | POA: Diagnosis present

## 2019-04-07 DIAGNOSIS — L089 Local infection of the skin and subcutaneous tissue, unspecified: Secondary | ICD-10-CM

## 2019-04-07 DIAGNOSIS — E872 Acidosis, unspecified: Secondary | ICD-10-CM | POA: Diagnosis present

## 2019-04-07 DIAGNOSIS — R739 Hyperglycemia, unspecified: Secondary | ICD-10-CM

## 2019-04-07 LAB — GLUCOSE, CAPILLARY
Glucose-Capillary: 31 mg/dL — CL (ref 70–99)
Glucose-Capillary: 134 mg/dL — ABNORMAL HIGH (ref 70–99)

## 2019-04-07 LAB — URINALYSIS, ROUTINE W REFLEX MICROSCOPIC
Bacteria, UA: NONE SEEN
Bilirubin Urine: NEGATIVE
Glucose, UA: >=500 mg/dL — AB
Hgb urine dipstick: NEGATIVE
Ketones, ur: NEGATIVE mg/dL
Leukocytes,Ua: NEGATIVE
Nitrite: NEGATIVE
Protein, ur: NEGATIVE mg/dL
Specific Gravity, Urine: 1.029 (ref 1.005–1.030)
pH: 6 (ref 5.0–8.0)

## 2019-04-07 LAB — CBC WITH DIFFERENTIAL/PLATELET
Abs Immature Granulocytes: 0.08 10*3/uL — ABNORMAL HIGH (ref 0.00–0.07)
Basophils Absolute: 0 10*3/uL (ref 0.0–0.1)
Basophils Relative: 0 %
Eosinophils Absolute: 0 10*3/uL (ref 0.0–0.5)
Eosinophils Relative: 0 %
HCT: 32.7 % — ABNORMAL LOW (ref 39.0–52.0)
Hemoglobin: 10.9 g/dL — ABNORMAL LOW (ref 13.0–17.0)
Immature Granulocytes: 1 %
Lymphocytes Relative: 7 %
Lymphs Abs: 1 10*3/uL (ref 0.7–4.0)
MCH: 31.5 pg (ref 26.0–34.0)
MCHC: 33.3 g/dL (ref 30.0–36.0)
MCV: 94.5 fL (ref 80.0–100.0)
Monocytes Absolute: 1 10*3/uL (ref 0.1–1.0)
Monocytes Relative: 6 %
Neutro Abs: 13.4 10*3/uL — ABNORMAL HIGH (ref 1.7–7.7)
Neutrophils Relative %: 86 %
Platelets: 316 10*3/uL (ref 150–400)
RBC: 3.46 MIL/uL — ABNORMAL LOW (ref 4.22–5.81)
RDW: 14.1 % (ref 11.5–15.5)
WBC: 15.5 10*3/uL — ABNORMAL HIGH (ref 4.0–10.5)
nRBC: 0 % (ref 0.0–0.2)

## 2019-04-07 LAB — SARS CORONAVIRUS 2 (TAT 6-24 HRS): SARS Coronavirus 2: NEGATIVE

## 2019-04-07 LAB — CBG MONITORING, ED
Glucose-Capillary: 405 mg/dL — ABNORMAL HIGH (ref 70–99)
Glucose-Capillary: 326 mg/dL — ABNORMAL HIGH (ref 70–99)
Glucose-Capillary: 181 mg/dL — ABNORMAL HIGH (ref 70–99)

## 2019-04-07 LAB — BASIC METABOLIC PANEL
Anion gap: 13 (ref 5–15)
BUN: 10 mg/dL (ref 6–20)
CO2: 23 mmol/L (ref 22–32)
Calcium: 9 mg/dL (ref 8.9–10.3)
Chloride: 94 mmol/L — ABNORMAL LOW (ref 98–111)
Creatinine, Ser: 0.9 mg/dL (ref 0.61–1.24)
GFR calc Af Amer: >60 mL/min (ref >60)
GFR calc non Af Amer: >60 mL/min (ref >60)
Glucose, Bld: 539 mg/dL (ref 70–99)
Potassium: 4.1 mmol/L (ref 3.5–5.1)
Sodium: 130 mmol/L — ABNORMAL LOW (ref 135–145)

## 2019-04-07 LAB — HEMOGLOBIN A1C
Hgb A1c MFr Bld: 14 % — ABNORMAL HIGH (ref 4.8–5.6)
Mean Plasma Glucose: 355.1 mg/dL

## 2019-04-07 LAB — LACTIC ACID, PLASMA: Lactic Acid, Venous: 3.9 mmol/L (ref 0.5–1.9)

## 2019-04-07 LAB — URIC ACID: Uric Acid, Serum: 4.3 mg/dL (ref 3.7–8.6)

## 2019-04-07 MED ORDER — INSULIN ASPART PROT & ASPART (70-30 MIX) 100 UNIT/ML ~~LOC~~ SUSP
30.0000 [IU] | Freq: Two times a day (BID) | SUBCUTANEOUS | Status: DC
  Administered 2019-04-07: 17:00:00 30 [IU] via SUBCUTANEOUS
  Filled 2019-04-07: qty 10

## 2019-04-07 MED ORDER — INSULIN REGULAR(HUMAN) IN NACL 100-0.9 UT/100ML-% IV SOLN
INTRAVENOUS | Status: DC
  Administered 2019-04-07: 14:00:00 10 [IU]/h via INTRAVENOUS
  Filled 2019-04-07: qty 100

## 2019-04-07 MED ORDER — INSULIN ASPART 100 UNIT/ML ~~LOC~~ SOLN: 0.0000 [IU] | Freq: Every day | SUBCUTANEOUS | Status: DC

## 2019-04-07 MED ORDER — SODIUM CHLORIDE 0.9% FLUSH
3.0000 mL | Freq: Two times a day (BID) | INTRAVENOUS | Status: DC
  Administered 2019-04-07 – 2019-04-11 (×3): 3 mL via INTRAVENOUS

## 2019-04-07 MED ORDER — DEXTROSE 50 % IV SOLN
INTRAVENOUS | Status: AC
  Administered 2019-04-07: 25 mL via INTRAVENOUS
  Filled 2019-04-07: qty 50

## 2019-04-07 MED ORDER — SODIUM CHLORIDE 0.9 % IV SOLN: INTRAVENOUS | Status: DC

## 2019-04-07 MED ORDER — ONDANSETRON HCL 4 MG/2ML IJ SOLN: 4.0000 mg | Freq: Four times a day (QID) | INTRAMUSCULAR | Status: DC | PRN

## 2019-04-07 MED ORDER — SODIUM CHLORIDE 0.9 % IV BOLUS
1000.0000 mL | Freq: Once | INTRAVENOUS | Status: AC
  Administered 2019-04-07: 15:00:00 1000 mL via INTRAVENOUS

## 2019-04-07 MED ORDER — HYDROCODONE-ACETAMINOPHEN 5-325 MG PO TABS
1.0000 | ORAL_TABLET | Freq: Four times a day (QID) | ORAL | Status: DC | PRN
  Administered 2019-04-08 – 2019-04-10 (×3): 1 via ORAL
  Filled 2019-04-07 (×3): qty 1

## 2019-04-07 MED ORDER — ENOXAPARIN SODIUM 40 MG/0.4ML ~~LOC~~ SOLN
40.0000 mg | SUBCUTANEOUS | Status: DC
  Administered 2019-04-07 – 2019-04-09 (×3): 40 mg via SUBCUTANEOUS
  Filled 2019-04-07 (×3): qty 0.4

## 2019-04-07 MED ORDER — SODIUM CHLORIDE 0.9 % IV BOLUS
1000.0000 mL | Freq: Once | INTRAVENOUS | Status: AC
  Administered 2019-04-07: 1000 mL via INTRAVENOUS

## 2019-04-07 MED ORDER — ACETAMINOPHEN 325 MG PO TABS
650.0000 mg | ORAL_TABLET | Freq: Four times a day (QID) | ORAL | Status: DC | PRN
  Administered 2019-04-08: 05:00:00 650 mg via ORAL
  Filled 2019-04-07 (×2): qty 2

## 2019-04-07 MED ORDER — INSULIN ASPART 100 UNIT/ML ~~LOC~~ SOLN
0.0000 [IU] | Freq: Three times a day (TID) | SUBCUTANEOUS | Status: DC
  Administered 2019-04-08 (×2): 11 [IU] via SUBCUTANEOUS
  Administered 2019-04-09: 12:00:00 3 [IU] via SUBCUTANEOUS
  Administered 2019-04-09 – 2019-04-10 (×3): 15 [IU] via SUBCUTANEOUS

## 2019-04-07 MED ORDER — DEXTROSE-NACL 5-0.45 % IV SOLN: INTRAVENOUS | Status: DC

## 2019-04-07 MED ORDER — PIPERACILLIN-TAZOBACTAM 3.375 G IVPB 30 MIN
3.3750 g | Freq: Once | INTRAVENOUS | Status: AC
  Administered 2019-04-07: 14:00:00 3.375 g via INTRAVENOUS
  Filled 2019-04-07: qty 50

## 2019-04-07 MED ORDER — INSULIN ASPART 100 UNIT/ML ~~LOC~~ SOLN
0.0000 [IU] | SUBCUTANEOUS | Status: DC
  Administered 2019-04-07: 16:00:00 3 [IU] via SUBCUTANEOUS

## 2019-04-07 MED ORDER — DEXTROSE 50 % IV SOLN: 0.0000 mL | INTRAVENOUS | Status: DC | PRN

## 2019-04-07 MED ORDER — ALBUTEROL SULFATE (2.5 MG/3ML) 0.083% IN NEBU: 2.5000 mg | INHALATION_SOLUTION | Freq: Four times a day (QID) | RESPIRATORY_TRACT | Status: DC | PRN

## 2019-04-07 MED ORDER — ONDANSETRON HCL 4 MG PO TABS: 4.0000 mg | ORAL_TABLET | Freq: Four times a day (QID) | ORAL | Status: DC | PRN

## 2019-04-07 MED ORDER — CEFAZOLIN SODIUM-DEXTROSE 1-4 GM/50ML-% IV SOLN
1.0000 g | Freq: Three times a day (TID) | INTRAVENOUS | Status: DC
  Administered 2019-04-07 – 2019-04-08 (×3): 1 g via INTRAVENOUS
  Filled 2019-04-07 (×4): qty 50

## 2019-04-07 MED ORDER — ACETAMINOPHEN 650 MG RE SUPP: 650.0000 mg | Freq: Four times a day (QID) | RECTAL | Status: DC | PRN

## 2019-04-07 MED ORDER — SACCHAROMYCES BOULARDII 250 MG PO CAPS
250.0000 mg | ORAL_CAPSULE | Freq: Two times a day (BID) | ORAL | Status: DC
  Administered 2019-04-07 – 2019-04-11 (×7): 250 mg via ORAL
  Filled 2019-04-07 (×7): qty 1

## 2019-04-07 MED ORDER — DEXTROSE 50 % IV SOLN: 1.0000 | Freq: Once | INTRAVENOUS | Status: AC

## 2019-04-07 MED ORDER — METOPROLOL TARTRATE 25 MG PO TABS
12.5000 mg | ORAL_TABLET | Freq: Two times a day (BID) | ORAL | Status: DC
  Administered 2019-04-07 – 2019-04-11 (×8): 12.5 mg via ORAL
  Filled 2019-04-07 (×8): qty 1

## 2019-04-07 NOTE — ED Notes (Signed)
Pt ambulatory to bathroom without any problems 

## 2019-04-07 NOTE — ED Notes (Signed)
Pt's dinner ordered from [employer].

## 2019-04-07 NOTE — Progress Notes (Signed)
Rm [id number] Paged to inform that pts CBG now 131 but his b/p is 160/102 and he is c/o diarrhea.Inquired if MD would like to add any new orders. 04/07/2019 @ 2050 Manson Allan, RN

## 2019-04-07 NOTE — ED Notes (Signed)
Pt's CBG result was 181. Informed Kim - RN.

## 2019-04-07 NOTE — ED Notes (Signed)
Pt received zosyn prior to blood cultures being ordered. See MAR for details.

## 2019-04-07 NOTE — ED Notes (Signed)
Pt returned from xray

## 2019-04-07 NOTE — Progress Notes (Signed)
CSW contacted financial counseling for assistance with arranging [place] [employer]. CSW provided contact information for local DSS in discharge paperwork for further follow-up.

## 2019-04-07 NOTE — ED Notes (Signed)
Gave pt "ER Happy Meal" and Sprite Zero, per Selena Batten - RN.

## 2019-04-07 NOTE — ED Provider Notes (Signed)
[facility] EMERGENCY DEPARTMENT Provider Note   CSN: [id number] Arrival date & time: 04/07/19  1148     History No chief complaint on file.   Garrett [name] is a 34 y.o. male.  Patient presenting with concerns of possible infection to his right great toe significant swelling to his right foot.  And bilateral leg swelling.  Patient is a type I diabetic according to his records.  Patient does state he is taking insulin NovoLog and then also supplementing with a sliding scale.  States he does not have a primary care doctor.  Record review shows that every time he seen in the emergency department his blood sugars are in the 500 range.  He has been in DKA in the past.        Past Medical History:  Diagnosis Date  . Compression fracture of C-spine ([facility])   . Type 1 diabetes [facility])     Patient Active Problem List   Diagnosis Date Noted  . Left arm cellulitis 08/15/2018  . Diabetes mellitus with hyperglycemia ([facility]) 08/15/2018  . DKA (diabetic ketoacidoses) ([facility]) 03/27/2017    Past Surgical History:  Procedure Laterality Date  . NO PAST SURGERIES         History reviewed. No pertinent family history.  Social History   Tobacco Use  . Smoking status: Current Every Day Smoker    Packs/day: 0.50    Types: Cigarettes  . Smokeless tobacco: Never Used  Substance Use Topics  . Alcohol use: Yes    Comment: occasionally  . Drug use: No    Home Medications Prior to Admission medications   Not on File    Allergies    Patient has no known allergies.  Review of Systems   Review of Systems  Constitutional: Negative for chills and fever.  HENT: Negative for congestion, rhinorrhea and sore throat.   Eyes: Negative for visual disturbance.  Respiratory: Negative for cough and shortness of breath.   Cardiovascular: Positive for leg swelling. Negative for chest pain.  Gastrointestinal: Negative for abdominal pain, diarrhea, nausea and vomiting.    Genitourinary: Negative for dysuria.  Musculoskeletal: Negative for back pain and neck pain.  Skin: Positive for wound. Negative for rash.  Neurological: Negative for dizziness, light-headedness and headaches.  Hematological: Does not bruise/bleed easily.  Psychiatric/Behavioral: Negative for confusion.    Physical Exam Updated Vital Signs BP (!) 145/96   Pulse 98   Temp 98.4 F (36.9 C) (Oral)   Resp 18   Ht 1.753 m (5\' 9" )   Wt 68 kg   SpO2 100%   BMI 22.15 kg/m   Physical Exam Vitals and nursing note reviewed.  Constitutional:      Appearance: Normal appearance. He is well-developed.  HENT:     Head: Normocephalic and atraumatic.  Eyes:     Extraocular Movements: Extraocular movements intact.     Conjunctiva/sclera: Conjunctivae normal.     Pupils: Pupils are equal, round, and reactive to light.  Cardiovascular:     Rate and Rhythm: Normal rate and regular rhythm.     Heart sounds: No murmur.  Pulmonary:     Effort: Pulmonary effort is normal. No respiratory distress.     Breath sounds: Normal breath sounds.  Abdominal:     Palpations: Abdomen is soft.     Tenderness: There is no abdominal tenderness.  Musculoskeletal:        General: Swelling present. Normal range of motion.     Cervical back: Normal  range of motion and neck supple.     Right lower leg: Edema present.     Left lower leg: Edema present.     Comments: Swelling of right foot greater than left.  Lot of swelling to toe on the right side.  Cap refill intact.  Some swelling to the leg bilaterally.  Erythema to the right foot more than the left.  Skin:    General: Skin is warm and dry.     Capillary Refill: Capillary refill takes 2 to 3 seconds.  Neurological:     General: No focal deficit present.     Mental Status: He is alert and oriented to person, place, and time.     ED Results / Procedures / Treatments   Labs (all labs ordered are listed, but only abnormal results are displayed) Labs  Reviewed  CBC WITH DIFFERENTIAL/PLATELET - Abnormal; Notable for the following components:      Result Value   WBC 15.5 (*)    RBC 3.46 (*)    Hemoglobin 10.9 (*)    HCT 32.7 (*)    Neutro Abs 13.4 (*)    Abs Immature Granulocytes 0.08 (*)    All other components within normal limits  BASIC METABOLIC PANEL - Abnormal; Notable for the following components:   Sodium 130 (*)    Chloride 94 (*)    Glucose, Bld 539 (*)    All other components within normal limits  URINALYSIS, ROUTINE W REFLEX MICROSCOPIC - Abnormal; Notable for the following components:   Color, Urine STRAW (*)    Glucose, UA >=500 (*)    All other components within normal limits  CBG MONITORING, ED - Abnormal; Notable for the following components:   Glucose-Capillary 405 (*)    All other components within normal limits  SARS CORONAVIRUS 2 (TAT 6-24 HRS)  CBG MONITORING, ED    EKG None  Radiology DG Foot Complete Right  Result Date: 04/07/2019 CLINICAL DATA:  Right foot pain and swelling in a diabetic patient. No known injury. EXAM: RIGHT FOOT COMPLETE - 3+ VIEW COMPARISON:  None. FINDINGS: Soft tissues are diffusely swollen. No soft tissue gas or radiopaque foreign body. No bony destructive change or periosteal reaction. Bones and joints appear normal. Atherosclerosis is noted. IMPRESSION: Diffuse soft tissue swelling. No bony or joint abnormality. Atherosclerosis. Electronically Signed   By: Inge Rise M.D.   On: 04/07/2019 12:36    Procedures Procedures (including critical care time)  CRITICAL CARE Performed by: Fredia Sorrow Total critical care time: 30 minutes Critical care time was exclusive of separately billable procedures and treating other patients. Critical care was necessary to treat or prevent imminent or life-threatening deterioration. Critical care was time spent personally by me on the following activities: development of treatment plan with patient and/or surrogate as well as nursing,  discussions with consultants, evaluation of patient's response to treatment, examination of patient, obtaining history from patient or surrogate, ordering and performing treatments and interventions, ordering and review of laboratory studies, ordering and review of radiographic studies, pulse oximetry and re-evaluation of patient's condition.   Medications Ordered in ED Medications  insulin regular, human (MYXREDLIN) 100 units/ 100 mL infusion (has no administration in time range)  0.9 %  sodium chloride infusion ( Intravenous New Bag/Given 04/07/19 1404)  dextrose 5 %-0.45 % sodium chloride infusion (has no administration in time range)  dextrose 50 % solution 0-50 mL (has no administration in time range)  piperacillin-tazobactam (ZOSYN) IVPB 3.375 g (3.375 g Intravenous  New Bag/Given 04/07/19 1405)    ED Course  I have reviewed the triage vital signs and the nursing notes.  Pertinent labs & imaging results that were available during my care of the patient were reviewed by me and considered in my medical decision making (see chart for details).    MDM Rules/Calculators/A&P                     Patient's blood sugar in the 500 range.  Patient every time seen has been in the upper 500s.  Not in DKA today.  But felt that he would benefit from an insulin drip to get him under control because of the right diabetic foot infection cellulitis x-ray of that area shows no bony abnormalities or any soft tissue gas.  Which is reassuring.  Patient started on Zosyn for this.  Patient does not have a primary care doctor.  States he is using insulin at home.  But does not seem to be under very good control.  Patient is unassigned medicine admission.  Discussed with medicine team they will see patient for admission.   Final Clinical Impression(s) / ED Diagnoses Final diagnoses:  Hyperglycemia  Diabetic foot infection [facility])    Rx / DC Orders ED Discharge Orders    None       Vanetta Mulders,  MD 04/07/19 1430

## 2019-04-07 NOTE — ED Notes (Signed)
Patient transported to X-ray 

## 2019-04-07 NOTE — H&P (Signed)
History and Physical    Garrett [name] ALP:[id number] DOB: 05/31/1985 DOA: 04/07/2019  Referring MD/NP/PA: Ria Comment, MD PCP: Patient, No Pcp Per  Patient coming from: Home  Chief Complaint: Right great toe pain and swelling  I have personally briefly reviewed patient's old medical records in [place]   HPI: Garrett [name] is a 34 y.o. male with medical history significant of diabetes mellitus type 1, who presents with right great toe pain and swelling.  Symptoms started about 3 weeks ago.  He is not quite sure about how/when he hurt his foot, but he has had swelling in both feet here recently.  He had been trying to keep the wound clean at home.  However over the last 2 to 3 days his right great toe had turned in color and was significantly more painful.  He feels like he is lost some of his feeling in that toe.  He moved to [place] from [place] 2 to 3 years ago where he had Medicaid.  Since that time he has not yet established care with a primary care provider and has been self treating his blood sugars with insulin that he buys at [facility].  Current regimen includes 30 units of 70/30 insulin twice daily and uses a sliding scale of regular insulin as needed.  Has been running out of supplies here lately.  He does not have Medicaid in [place].  ED Course:  Upon admission into the emergency department patient was seen to be afebrile, pulse 98-104, blood pressure 145/96, and all other vital signs maintained.  Labs significant for WBC 15.5, hemoglobin 10.9, sodium 130, CO2 23, glucose 539, and anion gap 13.  Urinalysis was positive for glucose, but negative for ketones or infection.  X-rays of the right foot reveal diffuse swelling without bony involvement. Patient was started on Zoysn.  Blood cultures were ordered after patient had received initial antibiotics.  Review of Systems  Constitutional: Positive for chills. Negative for fever.  HENT: Negative for congestion.    Eyes: Negative for double vision and photophobia.  Respiratory: Negative for cough and shortness of breath.   Cardiovascular: Negative for chest pain and leg swelling.  Gastrointestinal: Negative for nausea and vomiting.  Genitourinary: Positive for frequency. Negative for dysuria.  Musculoskeletal: Positive for joint pain. Negative for falls.  Skin: Negative for itching and rash.       Positive for skin color change  Neurological: Positive for sensory change.  Endo/Heme/Allergies: Positive for polydipsia.  Psychiatric/Behavioral: Negative for memory loss and substance abuse.    Past Medical History:  Diagnosis Date  . Compression fracture of C-spine ([place])   . Type 1 diabetes [facility])     Past Surgical History:  Procedure Laterality Date  . NO PAST SURGERIES       reports that he has been smoking cigarettes. He has been smoking about 0.50 packs per day. He has never used smokeless tobacco. He reports current alcohol use. He reports that he does not use drugs.  No Known Allergies  History reviewed. No pertinent family history.  Prior to Admission medications   Not on File    Physical Exam:  Constitutional: Young male in NAD, calm, comfortable Vitals:   04/07/19 1151 04/07/19 1315 04/07/19 1416  BP: (!) 155/142 (!) 145/96   Pulse: (!) 104 98   Resp: 18    Temp: 98.4 F (36.9 C)    TempSrc: Oral    SpO2: 100% 100%   Weight:  68 kg  Height:   5\' 9"  (1.753 m)   Eyes: PERRL, lids and conjunctivae normal ENMT: Mucous membranes are dry. Posterior pharynx clear of any exudate or lesions. Normal dentition.  Neck: normal, supple, no masses, no thyromegaly Respiratory: clear to auscultation bilaterally, no wheezing, no crackles. Normal respiratory effort. No accessory muscle use.  Cardiovascular: Regular rate and rhythm, no murmurs / rubs / gallops.  Bilateral lower extremity edema worse on the right foot. 2+ pedal pulses. No carotid bruits.  Abdomen: no tenderness, no  masses palpated. No hepatosplenomegaly. Bowel sounds positive.  Musculoskeletal: no clubbing / cyanosis. No joint deformity upper and lower extremities. Good ROM, no contractures. Normal muscle tone.  Skin: Erythema and increased warmth of the right great toe with wound seen below.     Neurologic: CN 2-12 grossly intact. Sensation intact, DTR normal. Strength 5/5 in all 4.  Psychiatric: Normal judgment and insight. Alert and oriented x 3. Normal mood.     Labs on Admission: I have personally reviewed following labs and imaging studies  CBC: Recent Labs  Lab 04/07/19 1240  WBC 15.5*  NEUTROABS 13.4*  HGB 10.9*  HCT 32.7*  MCV 94.5  PLT 316   Basic Metabolic Panel: Recent Labs  Lab 04/07/19 1240  NA 130*  K 4.1  CL 94*  CO2 23  GLUCOSE 539*  BUN 10  CREATININE 0.90  CALCIUM 9.0   GFR: Estimated Creatinine Clearance: 110.2 mL/min (by C-G formula based on SCr of 0.9 mg/dL). Liver Function Tests: No results for input(s): AST, ALT, ALKPHOS, BILITOT, PROT, ALBUMIN in the last 168 hours. No results for input(s): LIPASE, AMYLASE in the last 168 hours. No results for input(s): AMMONIA in the last 168 hours. Coagulation Profile: No results for input(s): INR, PROTIME in the last 168 hours. Cardiac Enzymes: No results for input(s): CKTOTAL, CKMB, CKMBINDEX, TROPONINI in the last 168 hours. BNP (last 3 results) No results for input(s): PROBNP in the last 8760 hours. HbA1C: No results for input(s): HGBA1C in the last 72 hours. CBG: Recent Labs  Lab 04/07/19 1359  GLUCAP 405*   Lipid Profile: No results for input(s): CHOL, HDL, LDLCALC, TRIG, CHOLHDL, LDLDIRECT in the last 72 hours. Thyroid Function Tests: No results for input(s): TSH, T4TOTAL, FREET4, T3FREE, THYROIDAB in the last 72 hours. Anemia Panel: No results for input(s): VITAMINB12, FOLATE, FERRITIN, TIBC, IRON, RETICCTPCT in the last 72 hours. Urine analysis:    Component Value Date/Time   COLORURINE STRAW  (A) 04/07/2019 1405   APPEARANCEUR CLEAR 04/07/2019 1405   LABSPEC 1.029 04/07/2019 1405   PHURINE 6.0 04/07/2019 1405   GLUCOSEU >=500 (A) 04/07/2019 1405   HGBUR NEGATIVE 04/07/2019 1405   BILIRUBINUR NEGATIVE 04/07/2019 1405   KETONESUR NEGATIVE 04/07/2019 1405   PROTEINUR NEGATIVE 04/07/2019 1405   NITRITE NEGATIVE 04/07/2019 1405   LEUKOCYTESUR NEGATIVE 04/07/2019 1405   Sepsis Labs: No results found for this or any previous visit (from the past 240 hour(s)).   Radiological Exams on Admission: DG Foot Complete Right  Result Date: 04/07/2019 CLINICAL DATA:  Right foot pain and swelling in a diabetic patient. No known injury. EXAM: RIGHT FOOT COMPLETE - 3+ VIEW COMPARISON:  None. FINDINGS: Soft tissues are diffusely swollen. No soft tissue gas or radiopaque foreign body. No bony destructive change or periosteal reaction. Bones and joints appear normal. Atherosclerosis is noted. IMPRESSION: Diffuse soft tissue swelling. No bony or joint abnormality. Atherosclerosis. Electronically Signed   By: 04/09/2019 M.D.   On: 04/07/2019  12:36    X-rays of the right foot: Independently reviewed.  Soft tissue inflammation of the right toe  Assessment/Plan Cellulitis of the right great toe: Acute.  Patient presents with complaints of pain and swelling of the right toe.  WBC elevated up to 15.5.  X-rays did not show any bony or joint abnormality.  Patient had been started on empiric antibiotics of Zosyn.  Patient meets moderate criteria. -Admit to a MedSurg bed -Follow-up blood culture -Change antibiotics to Ancef  Leukocytosis, lactic acidosis: Acute.  WBC elevated to 15.5 and lactic acid noted to be 3.9. -Bolus 2 L of normal saline IV fluids, and then continue on rate of 75 mL/h -Trend lactic acid level -Recheck CBC in a.m.  Diabetes mellitus type 1, uncontrolled with hyperglycemia: Acute.  Patient presents with blood glucose elevated up to 539.  CO2 23 and anion gap 13.  Urinalysis was  positive for glucose, but negative for ketones.  He does not appear to be in DKA.  Last hemoglobin A1c noted to be 13.4 on 08/15/2018.  Home regimen includes 70/30 insulin 30 units twice daily and sliding scale insulin as needed regular insulin.  Patient does not have a primary care provider and currently getting medications from [facility].  He had initially been started on a insulin drip.  Suspect blood glucose acutely elevated due to infection. -Discontinue insulin drip -Hypoglycemic protocol -70/30 insulin 30 units twice daily -CBGs before every meal with moderate SSI -Adjust regimen as needed -Diabetic education -Transitions of care consult  DVT prophylaxis: Lovenox Code Status: Full Family Communication: Girlfriend updated over the phone per patient request Disposition Plan: Possible discharge in 2 to 3 days Consults called: None Admission status: Inpatient  Clydie Braun MD [facility] Pager [phone]   If 7PM-7AM, please contact night-coverage [email or web address] Password [facility]  04/07/2019, 2:23 PM

## 2019-04-07 NOTE — ED Triage Notes (Signed)
Pt arrives to ED via POV w/ c/o foot swelling/pain. Pt has hx of diabetes type one.

## 2019-04-07 NOTE — ED Notes (Signed)
Pt's CBG result was 405. Informed Dr. Deretha Emory and Jeannett Senior - RN.

## 2019-04-07 NOTE — Progress Notes (Signed)
Notified [facility] MD Dr. Katherina Right that pt's CBG dropped to 31. Gave D 50. Inquired if MD would like to add dextrose to IVF's. Pt sitting up in chair drinking Sprite and awaiting tray in no distress at this time.  04/07/2019 Manson Allan, RN.

## 2019-04-07 NOTE — ED Notes (Signed)
This RN called pharmacy ref. Novolog Mix 70/30   St's they will send it.

## 2019-04-08 ENCOUNTER — Inpatient Hospital Stay (HOSPITAL_COMMUNITY): Payer: Medicaid - Out of State

## 2019-04-08 DIAGNOSIS — L03119 Cellulitis of unspecified part of limb: Secondary | ICD-10-CM

## 2019-04-08 DIAGNOSIS — E11628 Type 2 diabetes mellitus with other skin complications: Secondary | ICD-10-CM

## 2019-04-08 DIAGNOSIS — E1165 Type 2 diabetes mellitus with hyperglycemia: Secondary | ICD-10-CM

## 2019-04-08 DIAGNOSIS — Z794 Long term (current) use of insulin: Secondary | ICD-10-CM

## 2019-04-08 DIAGNOSIS — I82409 Acute embolism and thrombosis of unspecified deep veins of unspecified lower extremity: Secondary | ICD-10-CM

## 2019-04-08 LAB — BASIC METABOLIC PANEL
Anion gap: 9 (ref 5–15)
BUN: 8 mg/dL (ref 6–20)
CO2: 23 mmol/L (ref 22–32)
Calcium: 7.8 mg/dL — ABNORMAL LOW (ref 8.9–10.3)
Chloride: 100 mmol/L (ref 98–111)
Creatinine, Ser: 0.73 mg/dL (ref 0.61–1.24)
GFR calc Af Amer: >60 mL/min (ref >60)
GFR calc non Af Amer: >60 mL/min (ref >60)
Glucose, Bld: 55 mg/dL — ABNORMAL LOW (ref 70–99)
Potassium: 3.6 mmol/L (ref 3.5–5.1)
Sodium: 132 mmol/L — ABNORMAL LOW (ref 135–145)

## 2019-04-08 LAB — GLUCOSE, CAPILLARY
Glucose-Capillary: 77 mg/dL (ref 70–99)
Glucose-Capillary: 70 mg/dL (ref 70–99)
Glucose-Capillary: 319 mg/dL — ABNORMAL HIGH (ref 70–99)
Glucose-Capillary: 338 mg/dL — ABNORMAL HIGH (ref 70–99)
Glucose-Capillary: 174 mg/dL — ABNORMAL HIGH (ref 70–99)

## 2019-04-08 LAB — CBC
HCT: 27.5 % — ABNORMAL LOW (ref 39.0–52.0)
Hemoglobin: 9.1 g/dL — ABNORMAL LOW (ref 13.0–17.0)
MCH: 31.1 pg (ref 26.0–34.0)
MCHC: 33.1 g/dL (ref 30.0–36.0)
MCV: 93.9 fL (ref 80.0–100.0)
Platelets: 244 10*3/uL (ref 150–400)
RBC: 2.93 MIL/uL — ABNORMAL LOW (ref 4.22–5.81)
RDW: 14.1 % (ref 11.5–15.5)
WBC: 12.9 10*3/uL — ABNORMAL HIGH (ref 4.0–10.5)
nRBC: 0 % (ref 0.0–0.2)

## 2019-04-08 LAB — URIC ACID: Uric Acid, Serum: 3.1 mg/dL — ABNORMAL LOW (ref 3.7–8.6)

## 2019-04-08 MED ORDER — ADULT MULTIVITAMIN W/MINERALS CH: 1.0000 | ORAL_TABLET | Freq: Every day | ORAL | Status: DC

## 2019-04-08 MED ORDER — INSULIN ASPART 100 UNIT/ML ~~LOC~~ SOLN: 0.0000 [IU] | Freq: Every day | SUBCUTANEOUS | Status: DC

## 2019-04-08 MED ORDER — ADULT MULTIVITAMIN W/MINERALS CH
1.0000 | ORAL_TABLET | Freq: Every day | ORAL | Status: DC
  Administered 2019-04-08 – 2019-04-11 (×3): 1 via ORAL
  Filled 2019-04-08 (×3): qty 1

## 2019-04-08 MED ORDER — INSULIN ASPART PROT & ASPART (70-30 MIX) 100 UNIT/ML ~~LOC~~ SUSP
12.0000 [IU] | Freq: Two times a day (BID) | SUBCUTANEOUS | Status: DC
  Administered 2019-04-08 – 2019-04-09 (×2): 12 [IU] via SUBCUTANEOUS
  Filled 2019-04-08: qty 10

## 2019-04-08 MED ORDER — GLUCERNA SHAKE PO LIQD
237.0000 mL | Freq: Three times a day (TID) | ORAL | Status: DC
  Administered 2019-04-08 – 2019-04-11 (×9): 237 mL via ORAL

## 2019-04-08 MED ORDER — SODIUM CHLORIDE 0.9 % IV SOLN
1.0000 g | INTRAVENOUS | Status: DC
  Administered 2019-04-08 – 2019-04-10 (×3): 1 g via INTRAVENOUS
  Filled 2019-04-08 (×3): qty 1
  Filled 2019-04-08: qty 10

## 2019-04-08 MED ORDER — DEXTROSE-NACL 5-0.45 % IV SOLN: INTRAVENOUS | Status: DC

## 2019-04-08 NOTE — Progress Notes (Addendum)
Initial Nutrition Assessment  DOCUMENTATION CODES:   Not applicable  INTERVENTION:   -Glucerna Shake po TID, each supplement provides 220 kcal and 10 grams of protein -MVI with minerals daily -Provided "Carbohydrate Counting for People with Diabetes" handouts; attached to AVS/ discharge summary  NUTRITION DIAGNOSIS:   Increased nutrient needs related to wound healing as evidenced by estimated needs.  GOAL:   Patient will meet greater than or equal to 90% of their needs  MONITOR:   PO intake, Supplement acceptance, Labs, Weight trends, Skin, I & O's  REASON FOR ASSESSMENT:   Consult Diet education  ASSESSMENT:   Garrett [name] is a 34 y.o. male with medical history significant of diabetes mellitus type 1, who presents with right great toe pain and swelling.  Symptoms started about 3 weeks ago.  He is not quite sure about how/when he hurt his foot, but he has had swelling in both feet here recently.  He had been trying to keep the wound clean at home.  However over the last 2 to 3 days his right great toe had turned in color and was significantly more painful.  He feels like he is lost some of his feeling in that toe.  He moved to [place] from [place] 2 to 3 years ago where he had Medicaid.  Since that time he has not yet established care with a primary care provider and has been self treating his blood sugars with insulin that he buys at [facility].  Current regimen includes 30 units of 70/30 insulin twice daily and uses a sliding scale of regular insulin as needed.  Has been running out of supplies here lately.  He does not have Medicaid in [place].  Pt admitted with rt toe cellulitis.   Reviewed I/O's: +4.2 L x 24 hours  Attempted to see pt, however, receiving nursing care at time of visit. Also attempted to speak with pt via phone to obtain additional history, however, no answer.   Per chart review, pt has been without [place] Medicaid for approximately one year;  he has [facility] Medicaid which does not cover prescriptions in [place].   Noted fair intake; meal completion 50%.  Reviewed wt hx; wt has been stable over the past month.   Medications reviewed and include dextrose 5%-0.45% sodium chloride infusion.   Lab Results  Component Value Date   HGBA1C 14.0 (H) 04/07/2019  PTA DM medications are 30 units 70/30 insulin BID. Per H&P, pt has been running out of medications and supplies secondary to lack of [place] Medicaid.   Labs reviewed: Na: 132, CBGS: 31-134(inpatient orders for glycemic control are 0-15 units insulin aspart TID with meals and 30 units insulin aspart proatmine-aspart BID with meals).   Diet Order:   Diet Order            Diet Carb Modified Fluid consistency: Thin; Room service appropriate? Yes  Diet effective now              EDUCATION NEEDS:   No education needs have been identified at this time  Skin:  Skin Assessment: Skin Integrity Issues: Skin Integrity Issues:: Diabetic Ulcer Diabetic Ulcer: rt great toe  Last BM:  04/07/19  Height:   Ht Readings from Last 1 Encounters:  04/07/19 5\' 9"  (1.753 m)    Weight:   Wt Readings from Last 1 Encounters:  04/07/19 68 kg    Ideal Body Weight:  72.7 kg  BMI:  Body mass index is 22.15 kg/m.  Estimated Nutritional Needs:   Kcal:  1850-2050  Protein:  90-105 grams  Fluid:  > 1.8 L    Loistine Chance, RD, LDN, [place] Registered Dietitian II Certified Diabetes Care and Education Specialist Please refer to [facility] for RD and/or RD on-call/weekend/after hours pager

## 2019-04-08 NOTE — Progress Notes (Signed)
PROGRESS NOTE    Garrett [name]  FBP:[id number]  DOB: October 19, 1985  PCP: Patient, No Pcp Per Admit date:04/07/2019  34 y.o. male with h/o DM type 1 who moved to [place] few yrs ago and lost in PCP f/u, self managing insulin dosing, presented to ED on 2/21 with right great toe pain and swelling.  Symptoms started about 3 weeks ago.  He is not quite sure about how/when he hurt his foot, but he has had swelling in both feet recently.  He had been trying to keep the wound clean at home.  However over the last 2 to 3 days his right great toe had turned in color and was significantly more painful.  He feels like he is lost some of his feeling in that toe.  He moved to [place] from [place] 2 to 3 years ago where he had Medicaid.  Since that time he has not yet established care with a primary care provider and has been self treating his blood sugars with insulin that he buys at [facility].  Current regimen includes 30 units of 70/30 insulin twice daily and uses a sliding scale of regular insulin as needed.  Has been running out of supplies here lately. He does not have Medicaid in [place]. ED Course: Afebrile, pulse 98-104, blood pressure 145/96, and all other vital signs maintained.  Labs significant for WBC 15.5, hemoglobin 10.9, sodium 130, CO2 23, glucose 539, and anion gap 13. Lactic acid 3.9. Urinalysis was positive for glucose, but negative for ketones or infection.  X-rays of the right foot reveal diffuse swelling without bony involvement. Patient was started on Zoysn.  Blood cultures sent. Hospital course: Patient admitted to [facility] for further evaluation and management of diabetic foot infection/hyperglycemia.  Subjective:  Patient sleeping comfortably. He states he has been hurting in RLE (Lower leg as well as foot ) upon walking to the bathroom this morning. Admits to diet non compliance with diabetic diet at home. States he was advised to use 70/30 insulin 30 units BID when admitted at  [facility] a year ago with BG >300 at that time. He states his BG runs around 220 to 270 at home with the regimen.  Objective: Vitals:   04/07/19 1416 04/07/19 1849 04/07/19 2110 04/08/19 0504  BP:  (!) 155/102 (!) 160/102 121/73  Pulse:  (!) 102 (!) 105 90  Resp:  18 18 20   Temp:  98.5 F (36.9 C) 98.6 F (37 C) 100.2 F (37.9 C)  TempSrc:  Oral Oral Oral  SpO2:  100% 100% 98%  Weight: 68 kg     Height: 5\' 9"  (1.753 m)       Intake/Output Summary (Last 24 hours) at 04/08/2019 0910 Last data filed at 04/08/2019 0600 Gross per 24 hour  Intake 4206.34 ml  Output -  Net 4206.34 ml   Filed Weights   04/07/19 1416  Weight: 68 kg    Physical Examination:  General exam: Appears in no acute distress Respiratory system: Clear to auscultation. Respiratory effort normal. Cardiovascular system: S1 & S2 heard, RRR. No JVD, murmurs. Gastrointestinal system: Abdomen is nondistended, soft and nontender. Normal bowel sounds heard. Central nervous system: Alert and oriented. No new focal neurological deficits. Extremities: Right LE pitting edema 1+ compared to left. Rt foot swelling with redness /tenderness along the dorsum and rt toe ulceration/excoriated skin with redness as shown below. Skin: No rashes, lesions or ulcers Psychiatry: Judgement and insight appear normal. Mood & affect appropriate.  Data Reviewed: I have personally reviewed following labs and imaging studies  CBC: Recent Labs  Lab 04/07/19 1240 04/08/19 0256  WBC 15.5* 12.9*  NEUTROABS 13.4*  --   HGB 10.9* 9.1*  HCT 32.7* 27.5*  MCV 94.5 93.9  PLT 316 244   Basic Metabolic Panel: Recent Labs  Lab 04/07/19 1240 04/08/19 0256  NA 130* 132*  K 4.1 3.6  CL 94* 100  CO2 23 23  GLUCOSE 539* 55*  BUN 10 8  CREATININE 0.90 0.73  CALCIUM 9.0 7.8*   GFR: Estimated Creatinine Clearance: 124 mL/min (by C-G formula based on SCr of 0.73 mg/dL). Liver Function Tests: No results for input(s): AST, ALT,  ALKPHOS, BILITOT, PROT, ALBUMIN in the last 168 hours. No results for input(s): LIPASE, AMYLASE in the last 168 hours. No results for input(s): AMMONIA in the last 168 hours. Coagulation Profile: No results for input(s): INR, PROTIME in the last 168 hours. Cardiac Enzymes: No results for input(s): CKTOTAL, CKMB, CKMBINDEX, TROPONINI in the last 168 hours. BNP (last 3 results) No results for input(s): PROBNP in the last 8760 hours. HbA1C: Recent Labs    04/07/19 1240  HGBA1C 14.0*   CBG: Recent Labs  Lab 04/07/19 1547 04/07/19 2016 04/07/19 2104 04/08/19 0639 04/08/19 0732  GLUCAP 181* 31* 134* 77 70   Lipid Profile: No results for input(s): CHOL, HDL, LDLCALC, TRIG, CHOLHDL, LDLDIRECT in the last 72 hours. Thyroid Function Tests: No results for input(s): TSH, T4TOTAL, FREET4, T3FREE, THYROIDAB in the last 72 hours. Anemia Panel: No results for input(s): VITAMINB12, FOLATE, FERRITIN, TIBC, IRON, RETICCTPCT in the last 72 hours. Sepsis Labs: Recent Labs  Lab 04/07/19 1442  LATICACIDVEN 3.9*    Recent Results (from the past 240 hour(s))  Culture, blood (Routine X 2) w Reflex to ID Panel     Status: None (Preliminary result)   Collection Time: 04/07/19  2:26 PM   Specimen: BLOOD LEFT WRIST  Result Value Ref Range Status   Specimen Description BLOOD LEFT WRIST  Final   Special Requests   Final    BOTTLES DRAWN AEROBIC AND ANAEROBIC Blood Culture adequate volume   Culture   Final    NO GROWTH < 24 HOURS Performed at [facility], [address] [place], [place] [address]    Report Status PENDING  Incomplete  Culture, blood (Routine X 2) w Reflex to ID Panel     Status: None (Preliminary result)   Collection Time: 04/07/19  2:31 PM   Specimen: BLOOD RIGHT WRIST  Result Value Ref Range Status   Specimen Description BLOOD RIGHT WRIST  Final   Special Requests   Final    BOTTLES DRAWN AEROBIC AND ANAEROBIC Blood Culture results may not be optimal due to an  inadequate volume of blood received in culture bottles   Culture   Final    NO GROWTH < 24 HOURS Performed at [facility], [address], [place], [place] [address]    Report Status PENDING  Incomplete  SARS CORONAVIRUS 2 (TAT 6-24 HRS) Nasopharyngeal Nasopharyngeal Swab     Status: None   Collection Time: 04/07/19  2:50 PM   Specimen: Nasopharyngeal Swab  Result Value Ref Range Status   SARS Coronavirus 2 NEGATIVE NEGATIVE Final    Comment: (NOTE) SARS-CoV-2 target nucleic acids are NOT DETECTED. The SARS-CoV-2 RNA is generally detectable in upper and lower respiratory specimens during the acute phase of infection. Negative results do not preclude SARS-CoV-2 infection, do  not rule out co-infections with other pathogens, and should not be used as the sole basis for treatment or other patient management decisions. Negative results must be combined with clinical observations, patient history, and epidemiological information. The expected result is Negative. Fact Sheet for Patients: HairSlick.no Fact Sheet for Healthcare Providers: quierodirigir.com This test is not yet approved or cleared by the [place] FDA and  has been authorized for detection and/or diagnosis of SARS-CoV-2 by FDA under an Emergency Use Authorization (EUA). This EUA will remain  in effect (meaning this test can be used) for the duration of the COVID-19 declaration under Section 56 4(b)(1) of the Act, 21 U.S.C. section 360bbb-3(b)(1), unless the authorization is terminated or revoked sooner. Performed at [facility], [address] [place], [place] [address]       Radiology Studies: DG Foot Complete Right  Result Date: 04/07/2019 CLINICAL DATA:  Right foot pain and swelling in a diabetic patient. No known injury. EXAM: RIGHT FOOT COMPLETE - 3+ VIEW COMPARISON:  None. FINDINGS: Soft tissues are diffusely swollen. No soft tissue gas or  radiopaque foreign body. No bony destructive change or periosteal reaction. Bones and joints appear normal. Atherosclerosis is noted. IMPRESSION: Diffuse soft tissue swelling. No bony or joint abnormality. Atherosclerosis. Electronically Signed   By: Drusilla Kanner M.D.   On: 04/07/2019 12:36        Scheduled Meds: . enoxaparin (LOVENOX) injection  40 mg Subcutaneous Q24H  . insulin aspart  0-15 Units Subcutaneous TID WC  . insulin aspart protamine- aspart  30 Units Subcutaneous BID WC  . metoprolol tartrate  12.5 mg Oral BID  . saccharomyces boulardii  250 mg Oral BID  . sodium chloride flush  3 mL Intravenous Q12H   Continuous Infusions: .  ceFAZolin (ANCEF) IV 1 g (04/08/19 0527)  . dextrose 5 % and 0.45% NaCl 75 mL/hr at 04/08/19 0212    Assessment & Plan:   Diabetic foot/right great toe infection with sepsis syndrome POA: Patient febrile with tachycardia,  Leucocytosis and lactic acidosis on presentation--started of empiric abx -received  Zosyn in ED and admitted with IV Ancef. May need to broaden for gm -ve/anaerobic coverage given underlying DM (changed to ceftriaxone). He received IV fluid bolus 2 L of normal saline in ED, and  Started on dextrose fluids 75 mL/h over night--d/c IVF  Repeat lactate level. Obtain doppler LE to r/o DVT . If -ve may need arterial studies/podiatry eval. Leucocytosis improving. Symptomatic treatment with pain meds, leg elevation.   Diabetes mellitus type 1, uncontrolled with hyperglycemia: POA. Patient presented with blood glucose elevated up to 539 in the setting of infection and erratic home regimen.  CO2 23 and anion gap 13. Urinalysis was positive for glucose, but negative for ketones.DKA ruled out.   Last hemoglobin A1c noted to be 13.4 on 08/15/2018. Home regimen includes 70/30 insulin 30 units twice daily and sliding scale insulin as needed regular insulin.  Patient does not have a primary care provider and currently getting medications from  [facility].  He was initially started on a insulin drip in ED- transitioned to home regimen (70/30 insulin 30 units BID) at the time of admission with hypoglycemia protocol--received one dose around 5pm. Patient however had hypoglycemia overnight with BG dropping to 30s around 10pm and started on dextrose drip. Will hold 70/30 insulin and monitor BG levels on diabetic diet, adjust insulin regimen accordingly. He likely is diet non compliant at home. Will need PCP upon d/c. Diabetic educator/Transitions  of care consult requested. Taper dextrose fluids to off. Resume lower dose 70/30 insulin tonight.  Toe pain: Patient was taking BC powders at home. Advised of GI bleed risk.   DVT prophylaxis: Lovenox Code Status: Full code Family / Patient Communication: d/w patient and all questions answered to satisfaction Disposition Plan:   Patient is from home prior to hospitalization. Receiving inpatient care for diabetic foot infection, uncontrolled DM with erratic BG levels Discharge when BG stable, abx safely transitioned to PO, off IVF and appropriate home insulin regimen established possible in next 48-72 hrs.     LOS: 1 day    Time spent: 35 minutes    Guilford Shi, MD [facility] Pager in [place]  If 7PM-7AM, please contact night-coverage [email or web address] 04/08/2019, 9:10 AM

## 2019-04-08 NOTE — Progress Notes (Signed)
Right lower extremity venous duplex completed. Refer to "CV Proc" under chart review to view preliminary results.  04/08/2019 4:24 PM Eula Fried., MHA, RVT, RDCS, RDMS

## 2019-04-08 NOTE — Progress Notes (Addendum)
Inpatient Diabetes Program Recommendations  AACE/ADA: New Consensus Statement on Inpatient Glycemic Control (2015)  Target Ranges:  Prepandial:   less than 140 mg/dL      Peak postprandial:   less than 180 mg/dL (1-2 hours)      Critically ill patients:  140 - 180 mg/dL   Lab Results  Component Value Date   GLUCAP 70 04/08/2019   HGBA1C 14.0 (H) 04/07/2019    Review of Glycemic Control Results for Garrett [name], Garrett [name] (MRN [id number]) as of 04/08/2019 11:13  Ref. Range 04/07/2019 13:59 04/07/2019 14:45 04/07/2019 15:47 04/07/2019 20:16 04/07/2019 21:04 04/08/2019 06:39 04/08/2019 07:32  Glucose-Capillary Latest Ref Range: 70 - 99 mg/dL 244 (H) 010 (H) 272 (H) 31 (LL) 134 (H) 77 70   Diabetes history: DM 2 Outpatient Diabetes medications:  Novolin 70/30 30 units bid (although last note from [facility] notes that patient should be taking Novolin 70/30 30 units in the AM and 15 units with supper) Current orders for Inpatient glycemic control:  Novolog moderate tid with meals and HS  Inpatient Diabetes Program Recommendations:    Note low blood sugars on 2/21- Per review of chart, it appears that patient was only supposed to be taking Novolin 70/30 30 units in the AM and 15 units in the PM.   Consider restarting Novolin 70/30 at lower doses.  Consider 70/30 15 units with supper (starting tonight) and 70/30 20 units in the AM (with breakfast).  Doubt patient was taking insulin as prescribed.  Will speak with him today.    Thanks  Beryl Meager, RN, BC-ADM Inpatient Diabetes Coordinator Pager [phone] (8a-5p)  1400 Addendum- Spoke with patient regarding DM management.  He states that he is taking his insulin as prescribed.  Noted scar tissue on the back of both arms and on his abdomen.  Reinforced that he needs to change the locations for his insulin administration and recommended using "sides of abdomen or love handles" for better absorption and maybe legs/buttocks?  He verbalized understanding.  He buys  insulin from [place] and is currently using vial/syringe due to lower cost.  Also gave him information regarding the [employer] meter/strips.  His girlfriend states she is trying to get him a PCP at [facility].  He states he is also having frequent bowel movements throughout the day which makes it difficult for him to work. Discussed goal blood sugars, goal A1C's, and standards of care.  Patient states that he really wants to improve his A1C.  Support given to patient.

## 2019-04-08 NOTE — TOC Initial Note (Signed)
Transition of Care [facility]) - Initial/Assessment Note    Patient Details  Name: Garrett [name] MRN: [id number] Date of Birth: 1985/04/04  Transition of Care [facility]) CM/SW Contact:    Kingsley Plan, RN Phone Number: 04/08/2019, 12:45 PM  Clinical Narrative:                 Confirmed face sheet information.   Patient has been living in [place] for roughly a year, however has not started process of applying for [facility] Medicaid. Patient states he has active [facility] Medicaid which will not cover prescriptions in [place]. Explained NCM cannot provide medication assistance through [facility] due to him having [place] Medicaid. NCM will change pharmacy to [facility] pharmacy and continue to follow for medication cost.   Patient will start process for [place] Medicaid. Patient lives in [place], provided information on the [employer] in [place] , they require patient to call to schedule appointment.   Expected Discharge Plan: Home/Self Care Barriers to Discharge: No Barriers Identified   Patient Goals and CMS Choice Patient states their goals for this hospitalization and ongoing recovery are:: to return to home CMS Medicare.gov Compare Post Acute Care list provided to:: Patient    Expected Discharge Plan and Services Expected Discharge Plan: Home/Self Care   Discharge Planning Services: CM Consult   Living arrangements for the past 2 months: Single Family Home                 DME Arranged: N/A         HH Arranged: NA          Prior Living Arrangements/Services Living arrangements for the past 2 months: Single Family Home Lives with:: Relatives Patient language and need for interpreter reviewed:: Yes        Need for Family Participation in Patient Care: Yes (Comment) Care giver support system in place?: Yes (comment)   Criminal Activity/Legal Involvement Pertinent to Current Situation/Hospitalization: No - Comment as needed  Activities of Daily Living      Permission Sought/Granted Permission  sought to share information with : Case Manager Permission granted to share information with : No              Emotional Assessment Appearance:: Appears stated age Attitude/Demeanor/Rapport: Engaged Affect (typically observed): Accepting Orientation: : Oriented to Self, Oriented to Place, Oriented to  Time, Oriented to Situation Alcohol / Substance Use: Not Applicable Psych Involvement: No (comment)  Admission diagnosis:  Cellulitis [L03.90] Hyperglycemia [R73.9] Diabetic foot infection (HCC) [O24.235, L08.9] Patient Active Problem List   Diagnosis Date Noted  . Cellulitis 04/07/2019  . Leukocytosis 04/07/2019  . Lactic acidosis 04/07/2019  . Left arm cellulitis 08/15/2018  . Diabetes mellitus with hyperglycemia (HCC) 08/15/2018  . DKA (diabetic ketoacidoses) (HCC) 03/27/2017   PCP:  Patient, No Pcp Per Pharmacy:   [facility] [address]), [place] - [address] [place] ([address]) [place] [address] Phone: [phone] Fax: [phone]  [facility] [address] ([address]), [place] - [address] [place] ([place]) [place] [address] Phone: [phone] Fax: [phone]  [facility] [place], [place] - [address] [place] [address] Phone: [phone] Fax: [phone]     Social Determinants of Health (SDOH) Interventions    Readmission Risk Interventions No flowsheet data found.

## 2019-04-09 ENCOUNTER — Inpatient Hospital Stay (HOSPITAL_COMMUNITY): Payer: Medicaid - Out of State

## 2019-04-09 DIAGNOSIS — L0390 Cellulitis, unspecified: Secondary | ICD-10-CM

## 2019-04-09 LAB — CBC
HCT: 32.6 % — ABNORMAL LOW (ref 39.0–52.0)
Hemoglobin: 10.5 g/dL — ABNORMAL LOW (ref 13.0–17.0)
MCH: 30.6 pg (ref 26.0–34.0)
MCHC: 32.2 g/dL (ref 30.0–36.0)
MCV: 95 fL (ref 80.0–100.0)
Platelets: 352 10*3/uL (ref 150–400)
RBC: 3.43 MIL/uL — ABNORMAL LOW (ref 4.22–5.81)
RDW: 14.1 % (ref 11.5–15.5)
WBC: 12.6 10*3/uL — ABNORMAL HIGH (ref 4.0–10.5)
nRBC: 0 % (ref 0.0–0.2)

## 2019-04-09 LAB — LACTIC ACID, PLASMA: Lactic Acid, Venous: 2.1 mmol/L (ref 0.5–1.9)

## 2019-04-09 LAB — BASIC METABOLIC PANEL
Anion gap: 7 (ref 5–15)
BUN: 10 mg/dL (ref 6–20)
CO2: 24 mmol/L (ref 22–32)
Calcium: 8.3 mg/dL — ABNORMAL LOW (ref 8.9–10.3)
Chloride: 97 mmol/L — ABNORMAL LOW (ref 98–111)
Creatinine, Ser: 0.89 mg/dL (ref 0.61–1.24)
GFR calc Af Amer: >60 mL/min (ref >60)
GFR calc non Af Amer: >60 mL/min (ref >60)
Glucose, Bld: 389 mg/dL — ABNORMAL HIGH (ref 70–99)
Potassium: 4.2 mmol/L (ref 3.5–5.1)
Sodium: 128 mmol/L — ABNORMAL LOW (ref 135–145)

## 2019-04-09 LAB — GLUCOSE, CAPILLARY
Glucose-Capillary: 380 mg/dL — ABNORMAL HIGH (ref 70–99)
Glucose-Capillary: 193 mg/dL — ABNORMAL HIGH (ref 70–99)
Glucose-Capillary: 365 mg/dL — ABNORMAL HIGH (ref 70–99)
Glucose-Capillary: 88 mg/dL (ref 70–99)

## 2019-04-09 MED ORDER — INSULIN ASPART PROT & ASPART (70-30 MIX) 100 UNIT/ML ~~LOC~~ SUSP
20.0000 [IU] | Freq: Every day | SUBCUTANEOUS | Status: DC
  Administered 2019-04-11: 08:00:00 20 [IU] via SUBCUTANEOUS

## 2019-04-09 MED ORDER — INSULIN ASPART PROT & ASPART (70-30 MIX) 100 UNIT/ML ~~LOC~~ SUSP
8.0000 [IU] | Freq: Once | SUBCUTANEOUS | Status: AC
  Administered 2019-04-09: 8 [IU] via SUBCUTANEOUS

## 2019-04-09 MED ORDER — INSULIN ASPART PROT & ASPART (70-30 MIX) 100 UNIT/ML ~~LOC~~ SUSP
15.0000 [IU] | Freq: Every day | SUBCUTANEOUS | Status: DC
  Administered 2019-04-09 – 2019-04-10 (×2): 15 [IU] via SUBCUTANEOUS

## 2019-04-09 MED ORDER — FUROSEMIDE 10 MG/ML IJ SOLN
20.0000 mg | Freq: Once | INTRAMUSCULAR | Status: AC
  Administered 2019-04-09: 15:00:00 20 mg via INTRAVENOUS
  Filled 2019-04-09: qty 2

## 2019-04-09 NOTE — Progress Notes (Signed)
CRITICAL VALUE ALERT  Critical Value:  Lactic acid 2.1  Date & Time Notied:  04/09/19 @1531   Provider Notified:  Orders Received/Actions taken: Awaiting for further instructions

## 2019-04-09 NOTE — Consult Note (Signed)
Reason for Consult:Right great toe ulcer Referring Physician: N Kamineni  Garrett [name] is an 34 y.o. male.  HPI: Garrett [name] scraped his big toe helping someone move about 2 weeks ago. He was taking care of it at home but his foot swelled up dramatically about 2d ago and he came to the hospital for evaluation. He denies pain but does say it's sore on occasion. No discharge, fevers, chills, sweats, N/V, or prior e/o.  Past Medical History:  Diagnosis Date  . Compression fracture of C-spine (HCC)   . Type 1 diabetes [facility])     Past Surgical History:  Procedure Laterality Date  . NO PAST SURGERIES      History reviewed. No pertinent family history.  Social History:  reports that he has been smoking cigarettes. He has been smoking about 0.50 packs per day. He has never used smokeless tobacco. He reports current alcohol use. He reports current drug use. Drug: Marijuana.  Allergies: No Known Allergies  Medications: I have reviewed the patient's current medications.  Results for orders placed or performed during the hospital encounter of 04/07/19 (from the past 48 hour(s))  CBG monitoring, ED     Status: Abnormal   Collection Time: 04/07/19  3:47 PM  Result Value Ref Range   Glucose-Capillary 181 (H) 70 - 99 mg/dL   Comment 1 Notify RN    Comment 2 Document in Chart   Glucose, capillary     Status: Abnormal   Collection Time: 04/07/19  8:16 PM  Result Value Ref Range   Glucose-Capillary 31 (LL) 70 - 99 mg/dL   Comment 1 Notify RN   Glucose, capillary     Status: Abnormal   Collection Time: 04/07/19  9:04 PM  Result Value Ref Range   Glucose-Capillary 134 (H) 70 - 99 mg/dL  CBC     Status: Abnormal   Collection Time: 04/08/19  2:56 AM  Result Value Ref Range   WBC 12.9 (H) 4.0 - 10.5 K/uL   RBC 2.93 (L) 4.22 - 5.81 MIL/uL   Hemoglobin 9.1 (L) 13.0 - 17.0 g/dL   HCT 09.3 (L) 26.7 - 12.4 %   MCV 93.9 80.0 - 100.0 fL   MCH 31.1 26.0 - 34.0 pg   MCHC 33.1 30.0 - 36.0 g/dL   RDW  58.0 99.8 - 33.8 %   Platelets 244 150 - 400 K/uL   nRBC 0.0 0.0 - 0.2 %    Comment: Performed at [facility], [address], [place], [place] [address]  Basic metabolic panel     Status: Abnormal   Collection Time: 04/08/19  2:56 AM  Result Value Ref Range   Sodium 132 (L) 135 - 145 mmol/L   Potassium 3.6 3.5 - 5.1 mmol/L   Chloride 100 98 - 111 mmol/L   CO2 23 22 - 32 mmol/L   Glucose, Bld 55 (L) 70 - 99 mg/dL   BUN 8 6 - 20 mg/dL   Creatinine, Ser 9.76 0.61 - 1.24 mg/dL   Calcium 7.8 (L) 8.9 - 10.3 mg/dL   GFR calc non Af Amer >60 >60 mL/min   GFR calc Af Amer >60 >60 mL/min   Anion gap 9 5 - 15    Comment: Performed at [facility], [address], [place], [place] [address]  Uric acid     Status: Abnormal   Collection Time: 04/08/19  2:56 AM  Result Value Ref Range   Uric Acid, Serum 3.1 (L) 3.7 -  8.6 mg/dL    Comment: Performed at [facility], [address], [place], [place] [address]  Glucose, capillary     Status: None   Collection Time: 04/08/19  6:39 AM  Result Value Ref Range   Glucose-Capillary 77 70 - 99 mg/dL  Glucose, capillary     Status: None   Collection Time: 04/08/19  7:32 AM  Result Value Ref Range   Glucose-Capillary 70 70 - 99 mg/dL  Glucose, capillary     Status: Abnormal   Collection Time: 04/08/19 12:40 PM  Result Value Ref Range   Glucose-Capillary 319 (H) 70 - 99 mg/dL  Glucose, capillary     Status: Abnormal   Collection Time: 04/08/19  4:53 PM  Result Value Ref Range   Glucose-Capillary 338 (H) 70 - 99 mg/dL  Glucose, capillary     Status: Abnormal   Collection Time: 04/08/19  9:31 PM  Result Value Ref Range   Glucose-Capillary 174 (H) 70 - 99 mg/dL  Glucose, capillary     Status: Abnormal   Collection Time: 04/09/19  7:55 AM  Result Value Ref Range   Glucose-Capillary 380 (H) 70 - 99 mg/dL  Glucose, capillary     Status: Abnormal   Collection Time: 04/09/19 11:47 AM  Result Value Ref Range   Glucose-Capillary 193  (H) 70 - 99 mg/dL    Comment: Glucose reference range applies only to samples taken after fasting for at least 8 hours.    VAS [place] LOWER EXTREMITY VENOUS (DVT)  Result Date: 04/09/2019  Lower Venous DVTStudy Indications: Pain.  Comparison Study: No prior study Performing Technologist: Gertie Fey RDMS, RVT, RDCS  Examination Guidelines: A complete evaluation includes B-mode imaging, spectral Doppler, color Doppler, and power Doppler as needed of all accessible portions of each vessel. Bilateral testing is considered an integral part of a complete examination. Limited examinations for reoccurring indications may be performed as noted. The reflux portion of the exam is performed with the patient in reverse Trendelenburg.  +---------+---------------+---------+-----------+----------+--------------+ RIGHT    CompressibilityPhasicitySpontaneityPropertiesThrombus Aging +---------+---------------+---------+-----------+----------+--------------+ CFV      Full           Yes      Yes                                 +---------+---------------+---------+-----------+----------+--------------+ SFJ      Full                                                        +---------+---------------+---------+-----------+----------+--------------+ FV Prox  Full                                                        +---------+---------------+---------+-----------+----------+--------------+ FV Mid   Full                                                        +---------+---------------+---------+-----------+----------+--------------+ FV DistalFull                                                        +---------+---------------+---------+-----------+----------+--------------+  PFV      Full                                                        +---------+---------------+---------+-----------+----------+--------------+ POP      Full           Yes      Yes                                  +---------+---------------+---------+-----------+----------+--------------+ PTV      Full                                                        +---------+---------------+---------+-----------+----------+--------------+ PERO     Full                                                        +---------+---------------+---------+-----------+----------+--------------+   +----+---------------+---------+-----------+----------+--------------+ LEFTCompressibilityPhasicitySpontaneityPropertiesThrombus Aging +----+---------------+---------+-----------+----------+--------------+ CFV Full           Yes      Yes                                 +----+---------------+---------+-----------+----------+--------------+     Summary: RIGHT: - There is no evidence of deep vein thrombosis in the lower extremity.  - No cystic structure found in the popliteal fossa. - Ultrasound characteristics of enlarged lymph nodes are noted in the groin.  LEFT: - No evidence of common femoral vein obstruction.  *See table(s) above for measurements and observations. Electronically signed by Ruta Hinds MD on 04/09/2019 at 11:53:53 AM.    Final     Review of Systems  HENT: Negative for ear discharge, ear pain, hearing loss and tinnitus.   Eyes: Negative for photophobia and pain.  Respiratory: Negative for cough and shortness of breath.   Cardiovascular: Negative for chest pain.  Gastrointestinal: Negative for abdominal pain, nausea and vomiting.  Genitourinary: Negative for dysuria, flank pain, frequency and urgency.  Musculoskeletal: Positive for joint swelling (Right foot). Negative for back pain, myalgias and neck pain.  Neurological: Negative for dizziness and headaches.  Hematological: Does not bruise/bleed easily.  Psychiatric/Behavioral: The patient is not nervous/anxious.    Blood pressure 127/83, pulse 96, temperature 98.4 F (36.9 C), temperature source Axillary, resp. rate 18, height 5\' 9"   (1.753 m), weight 68 kg, SpO2 99 %. Physical Exam  Constitutional: He appears well-developed and well-nourished. No distress.  HENT:  Head: Normocephalic and atraumatic.  Eyes: Conjunctivae are normal. Right eye exhibits no discharge. Left eye exhibits no discharge. No scleral icterus.  Cardiovascular: Normal rate and regular rhythm.  Respiratory: Effort normal. No respiratory distress.  Musculoskeletal:     Cervical back: Normal range of motion.     Comments: LLE No traumatic wounds, ecchymosis, or rash  Nontender, ulceration medial aspect great toe  No knee or ankle effusion  Knee stable to varus/ valgus  and anterior/posterior stress  Sens DPN, SPN, TN paresthetic  Motor EHL, ext, flex, evers 5/5  DP 0, PT 0, 3+ pitting edema  Neurological: He is alert.  Skin: Skin is warm and dry. He is not diaphoretic.  Psychiatric: He has a normal mood and affect. His behavior is normal.    Assessment/Plan: Right great toe ulcer -- Will obtain ABI's. Dr. Lajoyce Corners to evaluate later today or in AM. DM    Freeman Caldron, PA-C Orthopedic Surgery [phone] 04/09/2019, 3:08 PM

## 2019-04-09 NOTE — Progress Notes (Addendum)
PROGRESS NOTE    Garrett [name]  IRS:[id number]  DOB: [birth year]  PCP: Patient, No Pcp Per Admit date:04/07/2019  34 y.o. male with h/o DM type 1 who moved to [place] few yrs ago and lost in PCP f/u, self managing insulin dosing, presented to ED on 2/21 with right great toe pain and swelling.  Symptoms started about 3 weeks ago.  He is not quite sure about how/when he hurt his foot, but he has had swelling in both feet recently.  He had been trying to keep the wound clean at home.  However over the last 2 to 3 days his right great toe had turned in color and was significantly more painful.  He feels like he is lost some of his feeling in that toe.  He moved to [place] from [place] 2 to 3 years ago where he had Medicaid.  Since that time he has not yet established care with a primary care provider and has been self treating his blood sugars with insulin that he buys at [facility].  Current regimen includes 30 units of 70/30 insulin twice daily and uses a sliding scale of regular insulin as needed. Admits to diet non compliance with diabetic diet at home. States he was advised to use 70/30 insulin 30 units BID when admitted at [facility] a year ago with BG >300 at that time. He states his BG runs around 220 to 270 at home with the regimen. Has been running out of supplies lately. He does not have Medicaid in [place]. Wants new PCP referral ED Course: Afebrile, pulse 98-104, blood pressure 145/96, and all other vital signs maintained.  Labs significant for WBC 15.5, hemoglobin 10.9, sodium 130, CO2 23, glucose 539, and anion gap 13. Lactic acid 3.9. Urinalysis was positive for glucose, but negative for ketones or infection.  X-rays of the right foot reveal diffuse swelling without bony involvement. Patient was started on Zoysn.  Blood cultures sent. Hospital course: Patient admitted to [facility] for further evaluation and management of diabetic foot infection/hyperglycemia.  Subjective:  Patient  walked out of bathroom and reports pain on bearing weight, says foot swelling somewhat improved.   Objective: Vitals:   04/08/19 1243 04/08/19 2130 04/09/19 0442 04/09/19 0937  BP: (!) 149/97 133/89 (!) 143/92 (!) 154/92  Pulse: 88 96 94 96  Resp: 18 18 18    Temp: 99.3 F (37.4 C) 98.6 F (37 C) 98.6 F (37 C)   TempSrc: Oral Oral Oral   SpO2: 100% 100% 100%   Weight:      Height:        Intake/Output Summary (Last 24 hours) at 04/09/2019 1410 Last data filed at 04/09/2019 0930 Gross per 24 hour  Intake 540 ml  Output -  Net 540 ml   Filed Weights   04/07/19 1416  Weight: 68 kg    Physical Examination:  General exam: Appears in no acute distress Respiratory system: Clear to auscultation. Respiratory effort normal. Cardiovascular system: S1 & S2 heard, RRR. No JVD, murmurs. Gastrointestinal system: Abdomen is nondistended, soft and nontender. Normal bowel sounds heard. Central nervous system: Alert and oriented. No new focal neurological deficits. Extremities: Right LE pitting edema 1+ compared to left. Rt foot swelling with redness /tenderness along the dorsum and rt toe ulceration/excoriated skin with redness as shown below. Skin: No rashes, lesions or ulcers Psychiatry: Judgement and insight appear normal. Mood & affect appropriate.       Data Reviewed: I have personally reviewed following  labs and imaging studies  CBC: Recent Labs  Lab 04/07/19 1240 04/08/19 0256  WBC 15.5* 12.9*  NEUTROABS 13.4*  --   HGB 10.9* 9.1*  HCT 32.7* 27.5*  MCV 94.5 93.9  PLT 316 244   Basic Metabolic Panel: Recent Labs  Lab 04/07/19 1240 04/08/19 0256  NA 130* 132*  K 4.1 3.6  CL 94* 100  CO2 23 23  GLUCOSE 539* 55*  BUN 10 8  CREATININE 0.90 0.73  CALCIUM 9.0 7.8*   GFR: Estimated Creatinine Clearance: 124 mL/min (by C-G formula based on SCr of 0.73 mg/dL). Liver Function Tests: No results for input(s): AST, ALT, ALKPHOS, BILITOT, PROT, ALBUMIN in the last 168  hours. No results for input(s): LIPASE, AMYLASE in the last 168 hours. No results for input(s): AMMONIA in the last 168 hours. Coagulation Profile: No results for input(s): INR, PROTIME in the last 168 hours. Cardiac Enzymes: No results for input(s): CKTOTAL, CKMB, CKMBINDEX, TROPONINI in the last 168 hours. BNP (last 3 results) No results for input(s): PROBNP in the last 8760 hours. HbA1C: Recent Labs    04/07/19 1240  HGBA1C 14.0*   CBG: Recent Labs  Lab 04/08/19 1240 04/08/19 1653 04/08/19 2131 04/09/19 0755 04/09/19 1147  GLUCAP 319* 338* 174* 380* 193*   Lipid Profile: No results for input(s): CHOL, HDL, LDLCALC, TRIG, CHOLHDL, LDLDIRECT in the last 72 hours. Thyroid Function Tests: No results for input(s): TSH, T4TOTAL, FREET4, T3FREE, THYROIDAB in the last 72 hours. Anemia Panel: No results for input(s): VITAMINB12, FOLATE, FERRITIN, TIBC, IRON, RETICCTPCT in the last 72 hours. Sepsis Labs: Recent Labs  Lab 04/07/19 1442  LATICACIDVEN 3.9*    Recent Results (from the past 240 hour(s))  Culture, blood (Routine X 2) w Reflex to ID Panel     Status: None (Preliminary result)   Collection Time: 04/07/19  2:26 PM   Specimen: BLOOD LEFT WRIST  Result Value Ref Range Status   Specimen Description BLOOD LEFT WRIST  Final   Special Requests   Final    BOTTLES DRAWN AEROBIC AND ANAEROBIC Blood Culture adequate volume   Culture   Final    NO GROWTH 2 DAYS Performed at [facility], [address], [place], [place] [address]    Report Status PENDING  Incomplete  Culture, blood (Routine X 2) w Reflex to ID Panel     Status: None (Preliminary result)   Collection Time: 04/07/19  2:31 PM   Specimen: BLOOD RIGHT WRIST  Result Value Ref Range Status   Specimen Description BLOOD RIGHT WRIST  Final   Special Requests   Final    BOTTLES DRAWN AEROBIC AND ANAEROBIC Blood Culture results may not be optimal due to an inadequate volume of blood received in culture  bottles   Culture   Final    NO GROWTH 2 DAYS Performed at [facility], [address], [place], [place] [address]    Report Status PENDING  Incomplete  SARS CORONAVIRUS 2 (TAT 6-24 HRS) Nasopharyngeal Nasopharyngeal Swab     Status: None   Collection Time: 04/07/19  2:50 PM   Specimen: Nasopharyngeal Swab  Result Value Ref Range Status   SARS Coronavirus 2 NEGATIVE NEGATIVE Final    Comment: (NOTE) SARS-CoV-2 target nucleic acids are NOT DETECTED. The SARS-CoV-2 RNA is generally detectable in upper and lower respiratory specimens during the acute phase of infection. Negative results do not preclude SARS-CoV-2 infection, do not rule out co-infections with other pathogens, and should  not be used as the sole basis for treatment or other patient management decisions. Negative results must be combined with clinical observations, patient history, and epidemiological information. The expected result is Negative. Fact Sheet for Patients: HairSlick.no Fact Sheet for Healthcare Providers: quierodirigir.com This test is not yet approved or cleared by the [place] FDA and  has been authorized for detection and/or diagnosis of SARS-CoV-2 by FDA under an Emergency Use Authorization (EUA). This EUA will remain  in effect (meaning this test can be used) for the duration of the COVID-19 declaration under Section 56 4(b)(1) of the Act, 21 U.S.C. section 360bbb-3(b)(1), unless the authorization is terminated or revoked sooner. Performed at [facility], [address] [place], [place] [address]       Radiology Studies: VAS [place] LOWER EXTREMITY VENOUS (DVT)  Result Date: 04/09/2019  Lower Venous DVTStudy Indications: Pain.  Comparison Study: No prior study Performing Technologist: Gertie Fey RDMS, RVT, RDCS  Examination Guidelines: A complete evaluation includes B-mode imaging, spectral Doppler, color Doppler, and  power Doppler as needed of all accessible portions of each vessel. Bilateral testing is considered an integral part of a complete examination. Limited examinations for reoccurring indications may be performed as noted. The reflux portion of the exam is performed with the patient in reverse Trendelenburg.  +---------+---------------+---------+-----------+----------+--------------+ RIGHT    CompressibilityPhasicitySpontaneityPropertiesThrombus Aging +---------+---------------+---------+-----------+----------+--------------+ CFV      Full           Yes      Yes                                 +---------+---------------+---------+-----------+----------+--------------+ SFJ      Full                                                        +---------+---------------+---------+-----------+----------+--------------+ FV Prox  Full                                                        +---------+---------------+---------+-----------+----------+--------------+ FV Mid   Full                                                        +---------+---------------+---------+-----------+----------+--------------+ FV DistalFull                                                        +---------+---------------+---------+-----------+----------+--------------+ PFV      Full                                                        +---------+---------------+---------+-----------+----------+--------------+ POP  Full           Yes      Yes                                 +---------+---------------+---------+-----------+----------+--------------+ PTV      Full                                                        +---------+---------------+---------+-----------+----------+--------------+ PERO     Full                                                        +---------+---------------+---------+-----------+----------+--------------+    +----+---------------+---------+-----------+----------+--------------+ LEFTCompressibilityPhasicitySpontaneityPropertiesThrombus Aging +----+---------------+---------+-----------+----------+--------------+ CFV Full           Yes      Yes                                 +----+---------------+---------+-----------+----------+--------------+     Summary: RIGHT: - There is no evidence of deep vein thrombosis in the lower extremity.  - No cystic structure found in the popliteal fossa. - Ultrasound characteristics of enlarged lymph nodes are noted in the groin.  LEFT: - No evidence of common femoral vein obstruction.  *See table(s) above for measurements and observations. Electronically signed by Fabienne Bruns MD on 04/09/2019 at 11:53:53 AM.    Final         Scheduled Meds: . enoxaparin (LOVENOX) injection  40 mg Subcutaneous Q24H  . feeding supplement (GLUCERNA SHAKE)  237 mL Oral TID BM  . insulin aspart  0-15 Units Subcutaneous TID WC  . insulin aspart  0-5 Units Subcutaneous QHS  . insulin aspart protamine- aspart  15 Units Subcutaneous Q supper  . [START ON 04/10/2019] insulin aspart protamine- aspart  20 Units Subcutaneous Q breakfast  . metoprolol tartrate  12.5 mg Oral BID  . multivitamin with minerals  1 tablet Oral Daily  . saccharomyces boulardii  250 mg Oral BID  . sodium chloride flush  3 mL Intravenous Q12H   Continuous Infusions: . cefTRIAXone (ROCEPHIN)  IV 1 g (04/08/19 1705)    Assessment & Plan:   Diabetic foot/right great toe infection with sepsis syndrome POA: Patient febrile with tachycardia,  Leucocytosis and lactic acidosis on presentation--started of empiric abx -received  Zosyn in ED and admitted with IV Ancef. May need to broaden for gm -ve/anaerobic coverage given underlying DM (changed to ceftriaxone). He received IV fluid bolus 2 L of normal saline in ED, and  Started on dextrose fluids 75 mL/h over night--d/c IVF Repeat lactate level/CBC. Venous doppler  LE -ve for DVT . Will obtain arterial study/Ortho eval. Leucocytosis improving. Symptomatic treatment with pain meds, leg elevation and ted hose.  Lasix 20 mg x 1 for swelling  Diabetes mellitus type 1, uncontrolled with hyperglycemia: POA. Patient presented with blood glucose elevated up to 539 in the setting of infection and erratic home regimen.  CO2 23 and anion gap 13. Urinalysis was positive for glucose, but negative for ketones.DKA ruled out.   Last hemoglobin  A1c noted to be 13.4 on 08/15/2018. Home regimen includes 70/30 insulin 30 units twice daily and sliding scale insulin as needed regular insulin.  Patient does not have a primary care provider and currently getting medications from [facility].  He was initially started on a insulin drip in ED- transitioned to home regimen (70/30 insulin 30 units BID) at admission with hypoglycemia protocol--received one dose around 5pm. Patient however had hypoglycemia later in the night with BG dropping to 30s around 10pm and started on dextrose drip. Appreciate diabetes education consult and adjusted 70/30 insulin per their recommendations.  Patient advised to stick to diabetic diet and to avoid snacks. Will need PCP upon d/c.   Mild hyponatremia: Secondary to dehydration versus pseudohyponatremia in the setting of hyperglycemia.  Repeat labs today.  Toe pain: Patient was taking BC powders at home. Advised of GI bleed risk.   DVT prophylaxis: Lovenox Code Status: Full code Family / Patient Communication: d/w patient and all questions answered to satisfaction Disposition Plan:   Patient is from home prior to hospitalization. Receiving inpatient care for diabetic foot infection, uncontrolled DM with erratic BG levels Discharge when BG stable, abx safely transitioned to PO, off IVF and appropriate home insulin regimen established possible in next 24-48 hrs.     LOS: 2 days    Time spent: 35 minutes    Alessandra Bevels, MD [facility] Pager in  [place]  If 7PM-7AM, please contact night-coverage [email or web address] 04/09/2019, 2:10 PM

## 2019-04-09 NOTE — Progress Notes (Signed)
Inpatient Diabetes Program Recommendations  AACE/ADA: New Consensus Statement on Inpatient Glycemic Control (2015)  Target Ranges:  Prepandial:   less than 140 mg/dL      Peak postprandial:   less than 180 mg/dL (1-2 hours)      Critically ill patients:  140 - 180 mg/dL   Lab Results  Component Value Date   GLUCAP 380 (H) 04/09/2019   HGBA1C 14.0 (H) 04/07/2019    Review of Glycemic Control Results for KHOA, OPDAHL (MRN [id number]) as of 04/09/2019 11:48  Ref. Range 04/08/2019 07:32 04/08/2019 12:40 04/08/2019 16:53 04/08/2019 21:31 04/09/2019 07:55  Glucose-Capillary Latest Ref Range: 70 - 99 mg/dL 70 335 (H) 456 (H) 256 (H) 380 (H)  Diabetes history: DM 2 Outpatient Diabetes medications:  Novolin 70/30 30 units bid (although last note from [facility] notes that patient should be taking Novolin 70/30 30 units in the AM and 15 units with supper) Current orders for Inpatient glycemic control:  Novolog moderate tid with meals and HS Novolog 70/30 mix 20 units q AM and 15 units q PM Inpatient Diabetes Program Recommendations:   Agree with increases in 70/30- Will follow.   Thanks  Beryl Meager, RN, BC-ADM Inpatient Diabetes Coordinator Pager [phone] (8a-5p)

## 2019-04-09 NOTE — Progress Notes (Signed)
Ankle brachial index complete.  Please see CV Proc tab for preliminary results. Levin Bacon- RDMS, RVT 4:57 PM  04/09/2019

## 2019-04-10 ENCOUNTER — Inpatient Hospital Stay (HOSPITAL_COMMUNITY): Payer: Medicaid - Out of State | Admitting: Certified Registered Nurse Anesthetist

## 2019-04-10 ENCOUNTER — Encounter (HOSPITAL_COMMUNITY)
Admission: EM | Disposition: A | Discharge status: Home / Self Care | Payer: Self-pay | Source: Home / Self Care | Attending: Internal Medicine

## 2019-04-10 ENCOUNTER — Encounter (HOSPITAL_COMMUNITY): Payer: Self-pay | Admitting: Internal Medicine

## 2019-04-10 DIAGNOSIS — L03115 Cellulitis of right lower limb: Secondary | ICD-10-CM

## 2019-04-10 DIAGNOSIS — M869 Osteomyelitis, unspecified: Secondary | ICD-10-CM

## 2019-04-10 DIAGNOSIS — E1042 Type 1 diabetes mellitus with diabetic polyneuropathy: Secondary | ICD-10-CM

## 2019-04-10 DIAGNOSIS — L02611 Cutaneous abscess of right foot: Secondary | ICD-10-CM

## 2019-04-10 HISTORY — PX: AMPUTATION: SHX166

## 2019-04-10 LAB — GLUCOSE, CAPILLARY
Glucose-Capillary: 104 mg/dL — ABNORMAL HIGH (ref 70–99)
Glucose-Capillary: 337 mg/dL — ABNORMAL HIGH (ref 70–99)
Glucose-Capillary: 376 mg/dL — ABNORMAL HIGH (ref 70–99)
Glucose-Capillary: 269 mg/dL — ABNORMAL HIGH (ref 70–99)
Glucose-Capillary: 180 mg/dL — ABNORMAL HIGH (ref 70–99)
Glucose-Capillary: 120 mg/dL — ABNORMAL HIGH (ref 70–99)
Glucose-Capillary: 461 mg/dL — ABNORMAL HIGH (ref 70–99)
Glucose-Capillary: 431 mg/dL — ABNORMAL HIGH (ref 70–99)
Glucose-Capillary: 311 mg/dL — ABNORMAL HIGH (ref 70–99)
Glucose-Capillary: 184 mg/dL — ABNORMAL HIGH (ref 70–99)

## 2019-04-10 LAB — BASIC METABOLIC PANEL
Anion gap: 10 (ref 5–15)
BUN: 10 mg/dL (ref 6–20)
CO2: 23 mmol/L (ref 22–32)
Calcium: 8.2 mg/dL — ABNORMAL LOW (ref 8.9–10.3)
Chloride: 100 mmol/L (ref 98–111)
Creatinine, Ser: 0.8 mg/dL (ref 0.61–1.24)
GFR calc Af Amer: >60 mL/min (ref >60)
GFR calc non Af Amer: >60 mL/min (ref >60)
Glucose, Bld: 200 mg/dL — ABNORMAL HIGH (ref 70–99)
Potassium: 3.8 mmol/L (ref 3.5–5.1)
Sodium: 133 mmol/L — ABNORMAL LOW (ref 135–145)

## 2019-04-10 LAB — SURGICAL PCR SCREEN
MRSA, PCR: NEGATIVE
Staphylococcus aureus: POSITIVE — AB

## 2019-04-10 LAB — CBC
HCT: 28.6 % — ABNORMAL LOW (ref 39.0–52.0)
Hemoglobin: 9.6 g/dL — ABNORMAL LOW (ref 13.0–17.0)
MCH: 31 pg (ref 26.0–34.0)
MCHC: 33.6 g/dL (ref 30.0–36.0)
MCV: 92.3 fL (ref 80.0–100.0)
Platelets: 351 10*3/uL (ref 150–400)
RBC: 3.1 MIL/uL — ABNORMAL LOW (ref 4.22–5.81)
RDW: 14 % (ref 11.5–15.5)
WBC: 10.9 10*3/uL — ABNORMAL HIGH (ref 4.0–10.5)
nRBC: 0 % (ref 0.0–0.2)

## 2019-04-10 SURGERY — AMPUTATION DIGIT
Anesthesia: General | Site: Toe | Laterality: Right

## 2019-04-10 MED ORDER — PROPOFOL 10 MG/ML IV BOLUS
INTRAVENOUS | Status: DC | PRN
  Administered 2019-04-10: 150 mg via INTRAVENOUS

## 2019-04-10 MED ORDER — HYDROMORPHONE HCL 1 MG/ML IJ SOLN: 0.2500 mg | INTRAMUSCULAR | Status: DC | PRN

## 2019-04-10 MED ORDER — DIPHENHYDRAMINE HCL 50 MG/ML IJ SOLN
INTRAMUSCULAR | Status: AC
  Filled 2019-04-10: qty 1

## 2019-04-10 MED ORDER — PHENYLEPHRINE 40 MCG/ML (10ML) SYRINGE FOR IV PUSH (FOR BLOOD PRESSURE SUPPORT)
PREFILLED_SYRINGE | INTRAVENOUS | Status: AC
  Filled 2019-04-10: qty 10

## 2019-04-10 MED ORDER — FENTANYL CITRATE (PF) 250 MCG/5ML IJ SOLN
INTRAMUSCULAR | Status: AC
  Filled 2019-04-10: qty 5

## 2019-04-10 MED ORDER — KETOROLAC TROMETHAMINE 30 MG/ML IJ SOLN
INTRAMUSCULAR | Status: DC | PRN
  Administered 2019-04-10: 30 mg via INTRAVENOUS

## 2019-04-10 MED ORDER — ENSURE PRE-SURGERY PO LIQD
296.0000 mL | Freq: Once | ORAL | Status: DC
  Filled 2019-04-10: qty 296

## 2019-04-10 MED ORDER — LIDOCAINE 2% (20 MG/ML) 5 ML SYRINGE
INTRAMUSCULAR | Status: AC
  Filled 2019-04-10: qty 5

## 2019-04-10 MED ORDER — POVIDONE-IODINE 10 % EX SWAB
2.0000 "application " | Freq: Once | CUTANEOUS | Status: AC
  Administered 2019-04-10: 2 via TOPICAL

## 2019-04-10 MED ORDER — PROPOFOL 10 MG/ML IV BOLUS
INTRAVENOUS | Status: AC
  Filled 2019-04-10: qty 20

## 2019-04-10 MED ORDER — 0.9 % SODIUM CHLORIDE (POUR BTL) OPTIME
TOPICAL | Status: DC | PRN
  Administered 2019-04-10: 11:00:00 1000 mL

## 2019-04-10 MED ORDER — LOPERAMIDE HCL 2 MG PO CAPS
2.0000 mg | ORAL_CAPSULE | ORAL | Status: DC | PRN
  Administered 2019-04-10 – 2019-04-11 (×2): 2 mg via ORAL
  Filled 2019-04-10 (×2): qty 1

## 2019-04-10 MED ORDER — LIDOCAINE 2% (20 MG/ML) 5 ML SYRINGE
INTRAMUSCULAR | Status: DC | PRN
  Administered 2019-04-10: 60 mg via INTRAVENOUS

## 2019-04-10 MED ORDER — LACTATED RINGERS IV SOLN: INTRAVENOUS | Status: DC

## 2019-04-10 MED ORDER — MIDAZOLAM HCL 2 MG/2ML IJ SOLN
INTRAMUSCULAR | Status: AC
  Filled 2019-04-10: qty 2

## 2019-04-10 MED ORDER — ACETAMINOPHEN 10 MG/ML IV SOLN
INTRAVENOUS | Status: DC | PRN
  Administered 2019-04-10: 1000 mg via INTRAVENOUS

## 2019-04-10 MED ORDER — ACETAMINOPHEN 10 MG/ML IV SOLN
INTRAVENOUS | Status: AC
  Filled 2019-04-10: qty 100

## 2019-04-10 MED ORDER — MUPIROCIN 2 % EX OINT
1.0000 "application " | TOPICAL_OINTMENT | Freq: Two times a day (BID) | CUTANEOUS | Status: DC
  Administered 2019-04-10 – 2019-04-11 (×2): 1 via NASAL
  Filled 2019-04-10: qty 22

## 2019-04-10 MED ORDER — CHLORHEXIDINE GLUCONATE 4 % EX LIQD
60.0000 mL | Freq: Once | CUTANEOUS | Status: AC
  Administered 2019-04-10: 4 via TOPICAL
  Filled 2019-04-10: qty 15

## 2019-04-10 MED ORDER — INSULIN ASPART PROT & ASPART (70-30 MIX) 100 UNIT/ML ~~LOC~~ SUSP
8.0000 [IU] | Freq: Once | SUBCUTANEOUS | Status: AC
  Administered 2019-04-10: 8 [IU] via SUBCUTANEOUS
  Filled 2019-04-10: qty 10

## 2019-04-10 MED ORDER — FENTANYL CITRATE (PF) 100 MCG/2ML IJ SOLN
INTRAMUSCULAR | Status: DC | PRN
  Administered 2019-04-10: 50 ug via INTRAVENOUS

## 2019-04-10 MED ORDER — PHENYLEPHRINE 40 MCG/ML (10ML) SYRINGE FOR IV PUSH (FOR BLOOD PRESSURE SUPPORT)
PREFILLED_SYRINGE | INTRAVENOUS | Status: DC | PRN
  Administered 2019-04-10 (×3): 80 ug via INTRAVENOUS

## 2019-04-10 MED ORDER — KETOROLAC TROMETHAMINE 30 MG/ML IJ SOLN
INTRAMUSCULAR | Status: AC
  Filled 2019-04-10: qty 1

## 2019-04-10 MED ORDER — CHLORHEXIDINE GLUCONATE CLOTH 2 % EX PADS
6.0000 | MEDICATED_PAD | Freq: Every day | CUTANEOUS | Status: DC
  Administered 2019-04-11: 05:00:00 6 via TOPICAL

## 2019-04-10 MED ORDER — SODIUM CHLORIDE 0.9 % IV SOLN: INTRAVENOUS | Status: DC

## 2019-04-10 MED ORDER — CEFAZOLIN SODIUM-DEXTROSE 2-4 GM/100ML-% IV SOLN
2.0000 g | INTRAVENOUS | Status: AC
  Administered 2019-04-10: 2 g via INTRAVENOUS
  Filled 2019-04-10: qty 100

## 2019-04-10 MED ORDER — ENOXAPARIN SODIUM 40 MG/0.4ML ~~LOC~~ SOLN
40.0000 mg | Freq: Every day | SUBCUTANEOUS | Status: DC
  Administered 2019-04-11: 10:00:00 40 mg via SUBCUTANEOUS
  Filled 2019-04-10: qty 0.4

## 2019-04-10 MED ORDER — INSULIN ASPART 100 UNIT/ML ~~LOC~~ SOLN
12.0000 [IU] | Freq: Once | SUBCUTANEOUS | Status: AC
  Administered 2019-04-10: 12 [IU] via SUBCUTANEOUS

## 2019-04-10 MED ORDER — ONDANSETRON HCL 4 MG/2ML IJ SOLN
INTRAMUSCULAR | Status: DC | PRN
  Administered 2019-04-10: 4 mg via INTRAVENOUS

## 2019-04-10 MED ORDER — HYDROMORPHONE HCL 1 MG/ML IJ SOLN: 0.5000 mg | INTRAMUSCULAR | Status: DC | PRN

## 2019-04-10 MED ORDER — MIDAZOLAM HCL 5 MG/5ML IJ SOLN
INTRAMUSCULAR | Status: DC | PRN
  Administered 2019-04-10: 2 mg via INTRAVENOUS

## 2019-04-10 MED ORDER — INSULIN ASPART PROT & ASPART (70-30 MIX) 100 UNIT/ML ~~LOC~~ SUSP
10.0000 [IU] | Freq: Once | SUBCUTANEOUS | Status: AC
  Administered 2019-04-10: 10 [IU] via SUBCUTANEOUS

## 2019-04-10 MED ORDER — EPHEDRINE SULFATE-NACL 50-0.9 MG/10ML-% IV SOSY
PREFILLED_SYRINGE | INTRAVENOUS | Status: DC | PRN
  Administered 2019-04-10: 10 mg via INTRAVENOUS

## 2019-04-10 SURGICAL SUPPLY — 31 items
BLADE SURG 21 STRL SS (BLADE) ×3 IMPLANT
BNDG COHESIVE 4X5 TAN STRL (GAUZE/BANDAGES/DRESSINGS) ×3 IMPLANT
BNDG ESMARK 4X9 LF (GAUZE/BANDAGES/DRESSINGS) IMPLANT
BNDG GAUZE ELAST 4 BULKY (GAUZE/BANDAGES/DRESSINGS) ×3 IMPLANT
COVER SURGICAL LIGHT HANDLE (MISCELLANEOUS) ×6 IMPLANT
COVER WAND RF STERILE (DRAPES) ×3 IMPLANT
DRAPE U-SHAPE 47X51 STRL (DRAPES) ×3 IMPLANT
DRSG ADAPTIC 3X8 NADH LF (GAUZE/BANDAGES/DRESSINGS) IMPLANT
DRSG EMULSION OIL 3X3 NADH (GAUZE/BANDAGES/DRESSINGS) ×2 IMPLANT
DRSG PAD ABDOMINAL 8X10 ST (GAUZE/BANDAGES/DRESSINGS) ×3 IMPLANT
DURAPREP 26ML APPLICATOR (WOUND CARE) ×3 IMPLANT
ELECT REM PT RETURN 9FT ADLT (ELECTROSURGICAL) ×3
ELECTRODE REM PT RTRN 9FT ADLT (ELECTROSURGICAL) ×1 IMPLANT
GAUZE SPONGE 4X4 12PLY STRL (GAUZE/BANDAGES/DRESSINGS) IMPLANT
GAUZE SPONGE 4X4 12PLY STRL LF (GAUZE/BANDAGES/DRESSINGS) ×2 IMPLANT
GLOVE BIOGEL PI IND STRL 9 (GLOVE) ×1 IMPLANT
GLOVE BIOGEL PI INDICATOR 9 (GLOVE) ×2
GLOVE SURG ORTHO 9.0 STRL STRW (GLOVE) ×3 IMPLANT
GOWN STRL REUS W/ TWL XL LVL3 (GOWN DISPOSABLE) ×2 IMPLANT
GOWN STRL REUS W/TWL XL LVL3 (GOWN DISPOSABLE) ×4
KIT BASIN OR (CUSTOM PROCEDURE TRAY) ×3 IMPLANT
KIT TURNOVER KIT B (KITS) ×3 IMPLANT
MANIFOLD NEPTUNE II (INSTRUMENTS) ×3 IMPLANT
NEEDLE 22X1 1/2 (OR ONLY) (NEEDLE) IMPLANT
NS IRRIG 1000ML POUR BTL (IV SOLUTION) ×3 IMPLANT
PACK ORTHO EXTREMITY (CUSTOM PROCEDURE TRAY) ×3 IMPLANT
PAD ABD 8X10 STRL (GAUZE/BANDAGES/DRESSINGS) ×2 IMPLANT
PAD ARMBOARD 7.5X6 YLW CONV (MISCELLANEOUS) ×6 IMPLANT
SUT ETHILON 2 0 PSLX (SUTURE) ×3 IMPLANT
SYR CONTROL 10ML LL (SYRINGE) IMPLANT
TOWEL GREEN STERILE (TOWEL DISPOSABLE) ×3 IMPLANT

## 2019-04-10 NOTE — Anesthesia Preprocedure Evaluation (Signed)
Anesthesia Evaluation  Patient identified by MRN, date of birth, ID band Patient awake    Reviewed: Allergy & Precautions, H&P , NPO status , Patient's Chart, lab work & pertinent test results  Airway Mallampati: II  TM Distance: >3 FB Neck ROM: Full    Dental no notable dental hx. (+) Teeth Intact, Dental Advisory Given   Pulmonary Current Smoker and Patient abstained from smoking.,    Pulmonary exam normal breath sounds clear to auscultation       Cardiovascular negative cardio ROS   Rhythm:Regular Rate:Normal     Neuro/Psych negative neurological ROS  negative psych ROS   GI/Hepatic negative GI ROS, Neg liver ROS,   Endo/Other  diabetes, Type 1, Insulin Dependent  Renal/GU negative Renal ROS  negative genitourinary   Musculoskeletal   Abdominal   Peds  Hematology negative hematology ROS (+)   Anesthesia Other Findings   Reproductive/Obstetrics negative OB ROS                             Anesthesia Physical Anesthesia Plan  ASA: III  Anesthesia Plan: General   Post-op Pain Management:    Induction: Intravenous  PONV Risk Score and Plan: 2 and Ondansetron and Midazolam  Airway Management Planned: LMA  Additional Equipment:   Intra-op Plan:   Post-operative Plan: Extubation in OR  Informed Consent: I have reviewed the patients History and Physical, chart, labs and discussed the procedure including the risks, benefits and alternatives for the proposed anesthesia with the patient or authorized representative who has indicated his/her understanding and acceptance.     Dental advisory given  Plan Discussed with: CRNA  Anesthesia Plan Comments:         Anesthesia Quick Evaluation

## 2019-04-10 NOTE — Progress Notes (Signed)
CBG 461. Alessandra Bevels, MD paged. Awaiting MD response. Will continue to monitor.

## 2019-04-10 NOTE — Op Note (Signed)
04/10/2019  11:20 AM  PATIENT:  Garrett [name]    PRE-OPERATIVE DIAGNOSIS:  FOOT INFECTION  POST-OPERATIVE DIAGNOSIS:  Same  PROCEDURE:  AMPUTATION GREAT RIGHT TOE  SURGEON:  Nadara Mustard, MD  PHYSICIAN ASSISTANT:None ANESTHESIA:   General  PREOPERATIVE INDICATIONS:  Garrett [name] is a  34 y.o. male with a diagnosis of FOOT INFECTION who failed conservative measures and elected for surgical management.    The risks benefits and alternatives were discussed with the patient preoperatively including but not limited to the risks of infection, bleeding, nerve injury, cardiopulmonary complications, the need for revision surgery, among others, and the patient was willing to proceed.  OPERATIVE IMPLANTS: None  @ENCIMAGES @  OPERATIVE FINDINGS: Small amount of petechial bleeding or signs of infection at the MTP joint  OPERATIVE PROCEDURE: Patient was brought the operating room and underwent a general anesthetic.  After adequate levels anesthesia were obtained patient's right lower extremity was prepped using DuraPrep draped into a sterile field a timeout was called.  A fishmouth incision was made just distal to the MTP joint.  The amputation was carried down through the MTP joint.  There was minimal bleeding the wound was irrigated with normal saline electrocautery was used hemostasis there is no signs of abscess or infection at the level of the amputation.  The incision was closed using 2-0 nylon sterile dressing was applied patient was extubated taken the PACU in stable condition.   DISCHARGE PLANNING:  Antibiotic duration: Continue antibiotics for 24 hours postoperatively  Weightbearing: Ideally nonweightbearing on the right, however touchdown weightbearing is okay  Pain medication: Opioid pathway  Dressing care/ Wound VAC: Change dressing in the office in 1 week  Ambulatory devices: Walker or crutches  Discharge to: Home.  Follow-up: In the office 1 week post  operative.

## 2019-04-10 NOTE — Progress Notes (Signed)
CBG rechecked - [phone]. Alessandra Bevels, MD paged. Awaiting MD response. Will continue to monitor.

## 2019-04-10 NOTE — Anesthesia Procedure Notes (Signed)
Procedure Name: LMA Insertion Date/Time: 04/10/2019 10:47 AM Performed by: Valdez Brannan T, CRNA Pre-anesthesia Checklist: Patient identified, Emergency Drugs available, Suction available and Patient being monitored Patient Re-evaluated:Patient Re-evaluated prior to induction Oxygen Delivery Method: Circle system utilized Preoxygenation: Pre-oxygenation with 100% oxygen Induction Type: IV induction LMA: LMA inserted LMA Size: 5.0 Number of attempts: 1 Placement Confirmation: positive ETCO2 and breath sounds checked- equal and bilateral Tube secured with: Tape Dental Injury: Teeth and Oropharynx as per pre-operative assessment

## 2019-04-10 NOTE — Plan of Care (Signed)
  Problem: Education: Goal: Knowledge of General Education information will improve Description: Including pain rating scale, medication(s)/side effects and non-pharmacologic comfort measures Outcome: Progressing   Problem: Clinical Measurements: Goal: Will remain free from infection Outcome: Progressing   Problem: Safety: Goal: Ability to remain free from injury will improve Outcome: Progressing   

## 2019-04-10 NOTE — Progress Notes (Signed)
Orthopedic Tech Progress Note Patient Details:  Million Maharaj [birth year] [id number]  Ortho Devices Type of Ortho Device: Crutches, Postop shoe/boot Ortho Device/Splint Location: RLE Ortho Device/Splint Interventions: Ordered, Application   Post Interventions Patient Tolerated: Well Instructions Provided: Care of device, Adjustment of device   Donald Pore 04/10/2019, 1:00 PM

## 2019-04-10 NOTE — Consult Note (Signed)
ORTHOPAEDIC CONSULTATION  REQUESTING PHYSICIAN: Alessandra Bevels, MD  Chief Complaint: cellulitis and ulcer right great toe  HPI: Garrett [name] is a 34 y.o. male who presents with uncontrolled type 1 diabetes with abscess ulceration right great toe.  Past Medical History:  Diagnosis Date  . Compression fracture of C-spine (HCC)   . Type 1 diabetes [facility])    Past Surgical History:  Procedure Laterality Date  . NO PAST SURGERIES     Social History   Socioeconomic History  . Marital status: Single    Spouse name: Not on file  . Number of children: Not on file  . Years of education: Not on file  . Highest education level: Not on file  Occupational History  . Not on file  Tobacco Use  . Smoking status: Current Every Day Smoker    Packs/day: 0.50    Types: Cigarettes  . Smokeless tobacco: Never Used  Substance and Sexual Activity  . Alcohol use: Yes    Comment: occasionally  . Drug use: Yes    Types: Marijuana  . Sexual activity: Yes  Other Topics Concern  . Not on file  Social History Narrative  . Not on file   Social Determinants of Health   Financial Resource Strain:   . Difficulty of Paying Living Expenses: Not on file  Food Insecurity:   . Worried About Programme researcher, broadcasting/film/video in the Last Year: Not on file  . Ran Out of Food in the Last Year: Not on file  Transportation Needs:   . Lack of Transportation (Medical): Not on file  . Lack of Transportation (Non-Medical): Not on file  Physical Activity:   . Days of Exercise per Week: Not on file  . Minutes of Exercise per Session: Not on file  Stress:   . Feeling of Stress : Not on file  Social Connections:   . Frequency of Communication with Friends and Family: Not on file  . Frequency of Social Gatherings with Friends and Family: Not on file  . Attends Religious Services: Not on file  . Active Member of Clubs or Organizations: Not on file  . Attends Banker Meetings: Not on file  . Marital  Status: Not on file   History reviewed. No pertinent family history. - negative except otherwise stated in the family history section No Known Allergies Prior to Admission medications   Medication Sig Start Date End Date Taking? Authorizing Provider  Aspirin-Salicylamide-Caffeine (BC HEADACHE POWDER PO) Take 1 packet by mouth as needed (for pain).   Yes [provider]  insulin NPH-regular Human (NOVOLIN 70/30) (70-30) 100 UNIT/ML injection Inject 30 Units into the skin See admin instructions. Inject 30 units into the skin before breakfast and 30 units at bedtime   Yes [provider]  insulin regular (NOVOLIN R) 100 units/mL injection Inject 3-4 Units into the skin See admin instructions. Inject 3-4 units into the skin "as needed" two times a day (with breakfast and dinner) per sliding scale   Yes [provider]   VAS [place] ABI WITH/WO TBI  Result Date: 04/09/2019 LOWER EXTREMITY DOPPLER STUDY Indications: Ulceration.  Limitations: Today's exam was limited due to great toe too swollen for toe cuff. Performing Technologist: Levin Bacon RDMS, RVT  Examination Guidelines: A complete evaluation includes at minimum, Doppler waveform signals and systolic blood pressure reading at the level of bilateral brachial, anterior tibial, and posterior tibial arteries, when vessel segments are accessible. Bilateral testing is considered an integral  part of a complete examination. Photoelectric Plethysmograph (PPG) waveforms and toe systolic pressure readings are included as required and additional duplex testing as needed. Limited examinations for reoccurring indications may be performed as noted.  ABI Findings: +--------+------------------+-----+---------+--------+ Right   Rt Pressure (mmHg)IndexWaveform Comment  +--------+------------------+-----+---------+--------+ [id number]                    triphasic         +--------+------------------+-----+---------+--------+ PTA      208               1.26 biphasic          +--------+------------------+-----+---------+--------+ DP      214               1.30 biphasic          +--------+------------------+-----+---------+--------+ +--------+------------------+-----+---------+---------------+ Left    Lt Pressure (mmHg)IndexWaveform Comment         +--------+------------------+-----+---------+---------------+ [id number]                    triphasic                +--------+------------------+-----+---------+---------------+ PTA     175               1.06 triphasic                +--------+------------------+-----+---------+---------------+ DP      255               1.55 triphasicnoncompressible +--------+------------------+-----+---------+---------------+  Summary: Right: Resting right ankle-brachial index is within normal range. No evidence of significant right lower extremity arterial disease. Left: Resting left ankle-brachial index is within normal range. No evidence of significant left lower extremity arterial disease.  *See table(s) above for measurements and observations.  Electronically signed by Lemar Livings MD on 04/09/2019 at 6:07:06 PM.   Final    VAS [place] LOWER EXTREMITY VENOUS (DVT)  Result Date: 04/09/2019  Lower Venous DVTStudy Indications: Pain.  Comparison Study: No prior study Performing Technologist: Gertie Fey RDMS, RVT, RDCS  Examination Guidelines: A complete evaluation includes B-mode imaging, spectral Doppler, color Doppler, and power Doppler as needed of all accessible portions of each vessel. Bilateral testing is considered an integral part of a complete examination. Limited examinations for reoccurring indications may be performed as noted. The reflux portion of the exam is performed with the patient in reverse Trendelenburg.  +---------+---------------+---------+-----------+----------+--------------+ RIGHT    CompressibilityPhasicitySpontaneityPropertiesThrombus Aging  +---------+---------------+---------+-----------+----------+--------------+ CFV      Full           Yes      Yes                                 +---------+---------------+---------+-----------+----------+--------------+ SFJ      Full                                                        +---------+---------------+---------+-----------+----------+--------------+ FV Prox  Full                                                        +---------+---------------+---------+-----------+----------+--------------+  FV Mid   Full                                                        +---------+---------------+---------+-----------+----------+--------------+ FV DistalFull                                                        +---------+---------------+---------+-----------+----------+--------------+ PFV      Full                                                        +---------+---------------+---------+-----------+----------+--------------+ POP      Full           Yes      Yes                                 +---------+---------------+---------+-----------+----------+--------------+ PTV      Full                                                        +---------+---------------+---------+-----------+----------+--------------+ PERO     Full                                                        +---------+---------------+---------+-----------+----------+--------------+   +----+---------------+---------+-----------+----------+--------------+ LEFTCompressibilityPhasicitySpontaneityPropertiesThrombus Aging +----+---------------+---------+-----------+----------+--------------+ CFV Full           Yes      Yes                                 +----+---------------+---------+-----------+----------+--------------+     Summary: RIGHT: - There is no evidence of deep vein thrombosis in the lower extremity.  - No cystic structure found in the popliteal fossa. -  Ultrasound characteristics of enlarged lymph nodes are noted in the groin.  LEFT: - No evidence of common femoral vein obstruction.  *See table(s) above for measurements and observations. Electronically signed by Ruta Hinds MD on 04/09/2019 at 11:53:53 AM.    Final    - pertinent xrays, CT, MRI studies were reviewed and independently interpreted  Positive ROS: All other systems have been reviewed and were otherwise negative with the exception of those mentioned in the HPI and as above.  Physical Exam: General: Alert, no acute distress Psychiatric: Patient is competent for consent with normal mood and affect Lymphatic: No axillary or cervical lymphadenopathy Cardiovascular: No pedal edema Respiratory: No cyanosis, no use of accessory musculature GI: No organomegaly, abdomen is soft and non-tender    Images:  @ENCIMAGES @  Labs:  Lab Results  Component Value Date   HGBA1C 14.0 (  H) 04/07/2019   HGBA1C 13.4 (H) 08/15/2018   HGBA1C 12.6 (H) 03/28/2017   LABURIC 3.1 (L) 04/08/2019   LABURIC 4.3 04/07/2019   REPTSTATUS PENDING 04/07/2019   CULT  04/07/2019    NO GROWTH 2 DAYS Performed at [facility], [address], [place], [place] [address]     Lab Results  Component Value Date   ALBUMIN 3.0 (L) 08/14/2018   ALBUMIN 3.7 04/08/2018   ALBUMIN 3.7 03/27/2017   LABURIC 3.1 (L) 04/08/2019   LABURIC 4.3 04/07/2019    Neurologic: Patient does not have protective sensation bilateral lower extremities.   MUSCULOSKELETAL:   Skin: Examination patient has purulent drainage from the right great toe.  When probed with a Q-tip this probes down to the tuft of the great toe.  Patient has palpable dorsalis pedis pulses bilaterally.  Patient has cellulitis and swelling of the right foot.  Review of the ankle-brachial indices shows noncompressible vessels with biphasic flow with ABI falsely elevated greater than 1.  Review of the radiographs shows no destructive bony  changes.  Hemoglobin A1c 14.0.  Albumin 3.0.  Assessment: Assessment: Uncontrolled type 1 diabetes with abscess ulceration osteomyelitis right great toe  Plan: Plan: Recommend patient proceed with an amputation of the great toe at the MTP joint.  Patient states that he needs to be at a family funeral on Friday.  Anticipate we could proceed with surgery today continue IV antibiotics after surgery with discharge on Friday.  Patient has been n.p.o. risks and benefits of surgery were discussed patient states he understands wished to proceed at this time.  Thank you for the consult and the opportunity to see Mr. Ziah Leandro, MD [facility] [phone] 6:56 AM

## 2019-04-10 NOTE — Progress Notes (Signed)
Pre-op report called and given to Susan, RN in Short Stay. All questions answered to satisfaction. OR personnel to transport pt.  

## 2019-04-10 NOTE — Progress Notes (Signed)
Nutrition Follow-up  DOCUMENTATION CODES:   Not applicable  INTERVENTION:   -Once diet is resumed, continue:  -Glucerna Shake po TID, each supplement provides 220 kcal and 10 grams of protein -MVI with minerals daily  NUTRITION DIAGNOSIS:   Increased nutrient needs related to wound healing as evidenced by estimated needs.  Ongoing  GOAL:   Patient will meet greater than or equal to 90% of their needs  Progressing   MONITOR:   PO intake, Supplement acceptance, Labs, Weight trends, Skin, I & O's  REASON FOR ASSESSMENT:   Consult Diet education  ASSESSMENT:   Garrett [name] is a 34 y.o. male with medical history significant of diabetes mellitus type 1, who presents with right great toe pain and swelling.  Symptoms started about 3 weeks ago.  He is not quite sure about how/when he hurt his foot, but he has had swelling in both feet here recently.  He had been trying to keep the wound clean at home.  However over the last 2 to 3 days his right great toe had turned in color and was significantly more painful.  He feels like he is lost some of his feeling in that toe.  He moved to [place] from [place] 2 to 3 years ago where he had Medicaid.  Since that time he has not yet established care with a primary care provider and has been self treating his blood sugars with insulin that he buys at [facility].  Current regimen includes 30 units of 70/30 insulin twice daily and uses a sliding scale of regular insulin as needed.  Has been running out of supplies here lately.  He does not have Medicaid in [place].  Reviewed I/O's: +637 ml x 24 hours and +5.9 L since admission  Pt in OR at time of visit. Per orthopedics notes, scheduled for amputation of the great toe at the MTP joint.  Pt with improved appetite; noted meal completion 50-75%. He is consuming Ensure supplements per MAR.   Per MD notes, possible discharge on Friday, 04/12/19, with antibiotics.   Labs reviewed:  Na: 133, CBGS: 88-376 (inpatient orders for glycemic control are 0-15 units inuslin aspart TID with meals, 0-5 units insulin aspart q HS, 15 units inuslin aspart protamine-aspart daily with supper, and 20 units insulin aspart protamine-aspart daily with breakfast).   Diet Order:   Diet Order            Diet NPO time specified  Diet effective now              EDUCATION NEEDS:   No education needs have been identified at this time  Skin:  Skin Assessment: Skin Integrity Issues: Skin Integrity Issues:: Diabetic Ulcer Diabetic Ulcer: rt great toe  Last BM:  04/08/19  Height:   Ht Readings from Last 1 Encounters:  04/10/19 5\' 9"  (1.753 m)    Weight:   Wt Readings from Last 1 Encounters:  04/10/19 68 kg    Ideal Body Weight:  72.7 kg  BMI:  Body mass index is 22.15 kg/m.  Estimated Nutritional Needs:   Kcal:  1850-2050  Protein:  90-105 grams  Fluid:  > 1.8 L    04/12/19, RD, LDN, CDCES Registered Dietitian II Certified Diabetes Care and Education Specialist Please refer to [facility] for RD and/or RD on-call/weekend/after hours pager

## 2019-04-10 NOTE — Progress Notes (Signed)
PROGRESS NOTE    Garrett [name]  SEL:[id number]  DOB: 04-Aug-1985  PCP: Patient, No Pcp Per Admit date:04/07/2019  34 y.o. male with h/o DM type 1 who moved to [place] few yrs ago and lost in PCP f/u, self managing insulin dosing, presented to ED on 2/21 with right great toe pain and swelling.  Symptoms started about 3 weeks ago.  He is not quite sure about how/when he hurt his foot, but he has had swelling in both feet recently.  He had been trying to keep the wound clean at home.  However over the last 2 to 3 days his right great toe had turned in color and was significantly more painful.  He feels like he is lost some of his feeling in that toe.  He moved to [place] from [place] 2 to 3 years ago where he had Medicaid.  Since that time he has not yet established care with a primary care provider and has been self treating his blood sugars with insulin that he buys at [facility].  Current regimen includes 30 units of 70/30 insulin twice daily and uses a sliding scale of regular insulin as needed. Admits to diet non compliance with diabetic diet at home. States he was advised to use 70/30 insulin 30 units BID when admitted at [facility] a year ago with BG >300 at that time. He states his BG runs around 220 to 270 at home with the regimen. Has been running out of supplies lately. He does not have Medicaid in [place]. Wants new PCP referral ED Course: Afebrile, pulse 98-104, blood pressure 145/96, and all other vital signs maintained.  Labs significant for WBC 15.5, hemoglobin 10.9, sodium 130, CO2 23, glucose 539, and anion gap 13. Lactic acid 3.9. Urinalysis was positive for glucose, but negative for ketones or infection.  X-rays of the right foot reveal diffuse swelling without bony involvement. Patient was started on Zoysn.  Blood cultures sent. Hospital course: Patient admitted to [facility] for further evaluation and management of diabetic foot infection/hyperglycemia.  Subjective: Patient seen by  orthopedics and underwent toe amputation this morning and tolerated procedure well.  Patient seen this afternoon postoperatively.  Noted to be standing at the edge of the bed using crutches and talking on the phone.  States he has to attend a funeral in [place] on Friday.  Objective: Vitals:   04/10/19 1115 04/10/19 1130 04/10/19 1200 04/10/19 1224  BP: 105/66 108/71 115/78 118/85  Pulse: 76 74 73 78  Resp: 20 15 15 16   Temp: 98.6 F (37 C)  98.7 F (37.1 C) 98.1 F (36.7 C)  TempSrc:    Oral  SpO2: 100% 100% 100% 100%  Weight:      Height:        Intake/Output Summary (Last 24 hours) at 04/10/2019 1418 Last data filed at 04/10/2019 1120 Gross per 24 hour  Intake 700 ml  Output 25 ml  Net 675 ml   Filed Weights   04/07/19 1416 04/10/19 1023  Weight: 68 kg 68 kg    Physical Examination:  General exam: Appears in no acute distress Respiratory system: Clear to auscultation. Respiratory effort normal. Cardiovascular system: S1 & S2 heard, RRR. No JVD, murmurs. Gastrointestinal system: Abdomen is nondistended, soft and nontender. Normal bowel sounds heard. Central nervous system: Alert and oriented. No new focal neurological deficits. Extremities: Right LE pitting edema 1+ compared to left. Rt foot dressing in place after great toe amputation this morning. Psychiatry: Judgement  and insight appear normal. Mood & affect appropriate.       Data Reviewed: I have personally reviewed following labs and imaging studies  CBC: Recent Labs  Lab 04/07/19 1240 04/08/19 0256 04/09/19 1443 04/10/19 0226  WBC 15.5* 12.9* 12.6* 10.9*  NEUTROABS 13.4*  --   --   --   HGB 10.9* 9.1* 10.5* 9.6*  HCT 32.7* 27.5* 32.6* 28.6*  MCV 94.5 93.9 95.0 92.3  PLT 316 244 352 351   Basic Metabolic Panel: Recent Labs  Lab 04/07/19 1240 04/08/19 0256 04/09/19 1443 04/10/19 0226  NA 130* 132* 128* 133*  K 4.1 3.6 4.2 3.8  CL 94* 100 97* 100  CO2 23 23 24 23   GLUCOSE 539* 55* 389*  200*  BUN 10 8 10 10   CREATININE 0.90 0.73 0.89 0.80  CALCIUM 9.0 7.8* 8.3* 8.2*   GFR: Estimated Creatinine Clearance: 124 mL/min (by C-G formula based on SCr of 0.8 mg/dL). Liver Function Tests: No results for input(s): AST, ALT, ALKPHOS, BILITOT, PROT, ALBUMIN in the last 168 hours. No results for input(s): LIPASE, AMYLASE in the last 168 hours. No results for input(s): AMMONIA in the last 168 hours. Coagulation Profile: No results for input(s): INR, PROTIME in the last 168 hours. Cardiac Enzymes: No results for input(s): CKTOTAL, CKMB, CKMBINDEX, TROPONINI in the last 168 hours. BNP (last 3 results) No results for input(s): PROBNP in the last 8760 hours. HbA1C: No results for input(s): HGBA1C in the last 72 hours. CBG: Recent Labs  Lab 04/10/19 0601 04/10/19 0756 04/10/19 0953 04/10/19 1126 04/10/19 1220  GLUCAP 337* 376* 269* 180* 120*   Lipid Profile: No results for input(s): CHOL, HDL, LDLCALC, TRIG, CHOLHDL, LDLDIRECT in the last 72 hours. Thyroid Function Tests: No results for input(s): TSH, T4TOTAL, FREET4, T3FREE, THYROIDAB in the last 72 hours. Anemia Panel: No results for input(s): VITAMINB12, FOLATE, FERRITIN, TIBC, IRON, RETICCTPCT in the last 72 hours. Sepsis Labs: Recent Labs  Lab 04/07/19 1442 04/09/19 1443  LATICACIDVEN 3.9* 2.1*    Recent Results (from the past 240 hour(s))  Culture, blood (Routine X 2) w Reflex to ID Panel     Status: None (Preliminary result)   Collection Time: 04/07/19  2:26 PM   Specimen: BLOOD LEFT WRIST  Result Value Ref Range Status   Specimen Description BLOOD LEFT WRIST  Final   Special Requests   Final    BOTTLES DRAWN AEROBIC AND ANAEROBIC Blood Culture adequate volume   Culture   Final    NO GROWTH 2 DAYS Performed at [facility], [address], [place], [address] [place]    Report Status PENDING  Incomplete  Culture, blood (Routine X 2) w Reflex to ID Panel     Status: None (Preliminary result)    Collection Time: 04/07/19  2:31 PM   Specimen: BLOOD RIGHT WRIST  Result Value Ref Range Status   Specimen Description BLOOD RIGHT WRIST  Final   Special Requests   Final    BOTTLES DRAWN AEROBIC AND ANAEROBIC Blood Culture results may not be optimal due to an inadequate volume of blood received in culture bottles   Culture   Final    NO GROWTH 2 DAYS Performed at [facility], [address] [place] [address], [place], [address] [place]    Report Status PENDING  Incomplete  SARS CORONAVIRUS 2 (TAT 6-24 HRS) Nasopharyngeal Nasopharyngeal Swab     Status: None   Collection Time: 04/07/19  2:50 PM   Specimen: Nasopharyngeal  Swab  Result Value Ref Range Status   SARS Coronavirus 2 NEGATIVE NEGATIVE Final    Comment: (NOTE) SARS-CoV-2 target nucleic acids are NOT DETECTED. The SARS-CoV-2 RNA is generally detectable in upper and lower respiratory specimens during the acute phase of infection. Negative results do not preclude SARS-CoV-2 infection, do not rule out co-infections with other pathogens, and should not be used as the sole basis for treatment or other patient management decisions. Negative results must be combined with clinical observations, patient history, and epidemiological information. The expected result is Negative. Fact Sheet for Patients: HairSlick.nohttps://www.fda.gov/media/138098/download Fact Sheet for Healthcare Providers: quierodirigir.comhttps://www.fda.gov/media/138095/download This test is not yet approved or cleared by the [place] FDA and  has been authorized for detection and/or diagnosis of SARS-CoV-2 by FDA under an Emergency Use Authorization (EUA). This EUA will remain  in effect (meaning this test can be used) for the duration of the COVID-19 declaration under Section 56 4(b)(1) of the Act, 21 U.S.C. section 360bbb-3(b)(1), unless the authorization is terminated or revoked sooner. Performed at [facility], [address] [place], [place] [address]   Surgical pcr screen      Status: Abnormal   Collection Time: 04/10/19  8:44 AM   Specimen: Nasal Mucosa; Nasal Swab  Result Value Ref Range Status   MRSA, PCR NEGATIVE NEGATIVE Final   Staphylococcus aureus POSITIVE (A) NEGATIVE Final    Comment: (NOTE) The Xpert SA Assay (FDA approved for NASAL specimens in patients 36 years of age and older), is one component of a comprehensive surveillance program. It is not intended to diagnose infection nor to guide or monitor treatment. Performed at [facility], [address] [place], [place] [address]       Radiology Studies: VAS US ABI WITH/WO TBI  Result Date: 04/09/2019 LOWER EXTREMITY DOPPLER STUDY Indications: Ulceration.  Limitations: Today's exam was limited due to great toe too swollen for toe cuff. Performing Technologist: Levin BaconBynum, Claire RDMS, RVT  Examination Guidelines: A complete evaluation includes at minimum, Doppler waveform signals and systolic blood pressure reading at the level of bilateral brachial, anterior tibial, and posterior tibial arteries, when vessel segments are accessible. Bilateral testing is considered an integral part of a complete examination. Photoelectric Plethysmograph (PPG) waveforms and toe systolic pressure readings are included as required and additional duplex testing as needed. Limited examinations for reoccurring indications may be performed as noted.  ABI Findings: +--------+------------------+-----+---------+--------+  Right    Rt Pressure (mmHg) Index Waveform  Comment   +--------+------------------+-----+---------+--------+  Brachial 153                      triphasic           +--------+------------------+-----+---------+--------+  PTA      208                1.26  biphasic            +--------+------------------+-----+---------+--------+  DP       214                1.30  biphasic            +--------+------------------+-----+---------+--------+ +--------+------------------+-----+---------+---------------+  Left     Lt  Pressure (mmHg) Index Waveform  Comment          +--------+------------------+-----+---------+---------------+  Brachial 165                      triphasic                  +--------+------------------+-----+---------+---------------+  PTA      175                1.06  triphasic                  +--------+------------------+-----+---------+---------------+  DP       255                1.55  triphasic noncompressible  +--------+------------------+-----+---------+---------------+  Summary: Right: Resting right ankle-brachial index is within normal range. No evidence of significant right lower extremity arterial disease. Left: Resting left ankle-brachial index is within normal range. No evidence of significant left lower extremity arterial disease.  *See table(s) above for measurements and observations.  Electronically signed by Lemar Livings MD on 04/09/2019 at 6:07:06 PM.   Final    VAS [place] LOWER EXTREMITY VENOUS (DVT)  Result Date: 04/09/2019  Lower Venous DVTStudy Indications: Pain.  Comparison Study: No prior study Performing Technologist: Gertie Fey RDMS, RVT, RDCS  Examination Guidelines: A complete evaluation includes B-mode imaging, spectral Doppler, color Doppler, and power Doppler as needed of all accessible portions of each vessel. Bilateral testing is considered an integral part of a complete examination. Limited examinations for reoccurring indications may be performed as noted. The reflux portion of the exam is performed with the patient in reverse Trendelenburg.  +---------+---------------+---------+-----------+----------+--------------+  RIGHT     Compressibility Phasicity Spontaneity Properties Thrombus Aging  +---------+---------------+---------+-----------+----------+--------------+  CFV       Full            Yes       Yes                                    +---------+---------------+---------+-----------+----------+--------------+  SFJ       Full                                                              +---------+---------------+---------+-----------+----------+--------------+  FV Prox   Full                                                             +---------+---------------+---------+-----------+----------+--------------+  FV Mid    Full                                                             +---------+---------------+---------+-----------+----------+--------------+  FV Distal Full                                                             +---------+---------------+---------+-----------+----------+--------------+  PFV       Full                                                             +---------+---------------+---------+-----------+----------+--------------+  POP       Full            Yes       Yes                                    +---------+---------------+---------+-----------+----------+--------------+  PTV       Full                                                             +---------+---------------+---------+-----------+----------+--------------+  PERO      Full                                                             +---------+---------------+---------+-----------+----------+--------------+   +----+---------------+---------+-----------+----------+--------------+  LEFT Compressibility Phasicity Spontaneity Properties Thrombus Aging  +----+---------------+---------+-----------+----------+--------------+  CFV  Full            Yes       Yes                                    +----+---------------+---------+-----------+----------+--------------+     Summary: RIGHT: - There is no evidence of deep vein thrombosis in the lower extremity.  - No cystic structure found in the popliteal fossa. - Ultrasound characteristics of enlarged lymph nodes are noted in the groin.  LEFT: - No evidence of common femoral vein obstruction.  *See table(s) above for measurements and observations. Electronically signed by Fabienne Bruns MD on 04/09/2019 at 11:53:53 AM.    Final          Scheduled Meds:  enoxaparin (LOVENOX) injection  40 mg Subcutaneous Q24H   feeding supplement (GLUCERNA SHAKE)  237 mL Oral TID BM   insulin aspart  0-15 Units Subcutaneous TID WC   insulin aspart  0-5 Units Subcutaneous QHS   insulin aspart protamine- aspart  15 Units Subcutaneous Q supper   insulin aspart protamine- aspart  20 Units Subcutaneous Q breakfast   metoprolol tartrate  12.5 mg Oral BID   multivitamin with minerals  1 tablet Oral Daily   saccharomyces boulardii  250 mg Oral BID   sodium chloride flush  3 mL Intravenous Q12H   Continuous Infusions:  sodium chloride 75 mL/hr at 04/10/19 1235   cefTRIAXone (ROCEPHIN)  IV 1 g (04/09/19 1629)    Assessment & Plan:   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  Venous doppler LE -ve for DVT and arterial Dopplers showed adequate circulation.  Seen by orthopedics and there was concern for underlying abscess/osteomyelitis-patient underwent great toe amputation this morning and tolerated well.  Leucocytosis improving and down to 10 K.  Continue symptomatic treatment with pain meds, local dressings-crutches until cleared for weightbearing by orthopedics.  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  He will  likely need to be out of work for at least a week due to weightbearing restrictions-will discuss with orthopedics.   Diabetes mellitus type 1, uncontrolled with hyperglycemia: POA. Patient presented with blood glucose elevated up to 539 in the setting of infection and erratic home regimen.  CO2 23 and anion gap 13. Urinalysis was positive for glucose, but negative for ketones.DKA ruled out.   Last hemoglobin A1c noted to be 13.4 on 08/15/2018. Home regimen includes 70/30 insulin 30  units twice daily and sliding scale insulin as needed regular insulin.  Patient does not have a primary care provider and currently getting medications from [facility].  He was initially started on a insulin drip in ED- transitioned to home regimen (70/30 insulin 30 units BID) at admission. Patient however had hypoglycemia later in the night with BG dropping to 30s and started on dextrose drip-now discontinued. Appreciate diabetes education consult and adjusted 70/30 insulin per their recommendations.  Patient advised to stick to diabetic diet and to avoid snacks. Will need PCP upon d/c.   Mild hyponatremia: Secondary to dehydration versus pseudohyponatremia in the setting of hyperglycemia.  Repeat labs show improvement.  Toe pain: Patient was taking BC powders at home. Advised of GI bleed risk.   DVT prophylaxis: Lovenox Code Status: Full code Family / Patient Communication: d/w patient and all questions answered to satisfaction Disposition Plan:   Patient is from home prior to hospitalization. Receiving inpatient care for diabetic foot infection (he is postop day 0 from toe amputation), uncontrolled DM with erratic BG levels Discharge when BG stable, abx safely transitioned to PO, off IVF and appropriate home insulin regimen established possibly tomorrow evening or Friday early morning as patient also wishes to attend funeral of a family member in [place] on friday     LOS: 3 days    Time spent: 103 minutes    [place] Shi, MD [facility] Pager in [place]  If 7PM-7AM, please contact night-coverage [email or web address] 04/10/2019, 2:18 PM

## 2019-04-10 NOTE — Anesthesia Postprocedure Evaluation (Signed)
Anesthesia Post Note  Patient: Lexicographer  Procedure(s) Performed: AMPUTATION GREAT RIGHT TOE (Right Toe)     Patient location during evaluation: PACU Anesthesia Type: General Level of consciousness: awake and alert Pain management: pain level controlled Vital Signs Assessment: post-procedure vital signs reviewed and stable Respiratory status: spontaneous breathing, nonlabored ventilation and respiratory function stable Cardiovascular status: blood pressure returned to baseline and stable Postop Assessment: no apparent nausea or vomiting Anesthetic complications: no    Last Vitals:  Vitals:   04/10/19 1130 04/10/19 1200  BP: 108/71 115/78  Pulse: 74 73  Resp: 15 15  Temp:  37.1 C  SpO2: 100% 100%    Last Pain:  Vitals:   04/10/19 1200  TempSrc:   PainSc: Asleep        RLE Motor Response: Purposeful movement (04/10/19 1200) RLE Sensation: Full sensation (04/10/19 1200)      Wanya Bangura,W. EDMOND

## 2019-04-10 NOTE — Transfer of Care (Signed)
Immediate Anesthesia Transfer of Care Note  Patient: Garrett [name]  Procedure(s) Performed: AMPUTATION GREAT RIGHT TOE (Right Toe)  Patient Location: PACU  Anesthesia Type:General  Level of Consciousness: drowsy  Airway & Oxygen Therapy: Patient Spontanous Breathing and Patient connected to nasal cannula oxygen  Post-op Assessment: Report given to RN, Post -op Vital signs reviewed and stable and Patient moving all extremities  Post vital signs: Reviewed and stable  Last Vitals:  Vitals Value Taken Time  BP 105/66 04/10/19 1116  Temp    Pulse 77 04/10/19 1118  Resp 14 04/10/19 1118  SpO2 100 % 04/10/19 1118  Vitals shown include unvalidated device data.  Last Pain:  Vitals:   04/10/19 1020  TempSrc:   PainSc: 0-No pain      Patients Stated Pain Goal: 3 (04/10/19 1020)  Complications: No apparent anesthesia complications

## 2019-04-10 NOTE — Progress Notes (Signed)
Pt returned to room 6N09 via bed after surgery. Received report from Suzzette Righter, RN in PACU. See reassessment. Will continue to monitor.

## 2019-04-11 ENCOUNTER — Encounter (HOSPITAL_COMMUNITY): Payer: Self-pay

## 2019-04-11 ENCOUNTER — Other Ambulatory Visit: Payer: Self-pay | Admitting: Physician Assistant

## 2019-04-11 LAB — CBC
HCT: 29 % — ABNORMAL LOW (ref 39.0–52.0)
Hemoglobin: 9.4 g/dL — ABNORMAL LOW (ref 13.0–17.0)
MCH: 30.4 pg (ref 26.0–34.0)
MCHC: 32.4 g/dL (ref 30.0–36.0)
MCV: 93.9 fL (ref 80.0–100.0)
Platelets: 434 10*3/uL — ABNORMAL HIGH (ref 150–400)
RBC: 3.09 MIL/uL — ABNORMAL LOW (ref 4.22–5.81)
RDW: 14.4 % (ref 11.5–15.5)
WBC: 8.6 10*3/uL (ref 4.0–10.5)
nRBC: 0 % (ref 0.0–0.2)

## 2019-04-11 LAB — BASIC METABOLIC PANEL
Anion gap: 8 (ref 5–15)
BUN: 6 mg/dL (ref 6–20)
CO2: 24 mmol/L (ref 22–32)
Calcium: 7.9 mg/dL — ABNORMAL LOW (ref 8.9–10.3)
Chloride: 105 mmol/L (ref 98–111)
Creatinine, Ser: 0.61 mg/dL (ref 0.61–1.24)
GFR calc Af Amer: >60 mL/min (ref >60)
GFR calc non Af Amer: >60 mL/min (ref >60)
Glucose, Bld: 68 mg/dL — ABNORMAL LOW (ref 70–99)
Potassium: 3.4 mmol/L — ABNORMAL LOW (ref 3.5–5.1)
Sodium: 137 mmol/L (ref 135–145)

## 2019-04-11 LAB — GLUCOSE, CAPILLARY
Glucose-Capillary: 105 mg/dL — ABNORMAL HIGH (ref 70–99)
Glucose-Capillary: 72 mg/dL (ref 70–99)
Glucose-Capillary: 117 mg/dL — ABNORMAL HIGH (ref 70–99)

## 2019-04-11 MED ORDER — LISINOPRIL 5 MG PO TABS
5.0000 mg | ORAL_TABLET | Freq: Every day | ORAL | Status: DC
  Administered 2019-04-11: 15:00:00 5 mg via ORAL
  Filled 2019-04-11: qty 1

## 2019-04-11 MED ORDER — LISINOPRIL 5 MG PO TABS: 5.0000 mg | ORAL_TABLET | Freq: Every day | ORAL | 1 refills | Status: DC

## 2019-04-11 MED ORDER — NOVOLIN 70/30 (70-30) 100 UNIT/ML ~~LOC~~ SUSP: 20.0000 [IU] | SUBCUTANEOUS | 11 refills | Status: DC

## 2019-04-11 MED ORDER — ADULT MULTIVITAMIN W/MINERALS CH: 1.0000 | ORAL_TABLET | Freq: Every day | ORAL | 1 refills | Status: DC

## 2019-04-11 MED ORDER — MUPIROCIN 2 % EX OINT: 1.0000 "application " | TOPICAL_OINTMENT | Freq: Two times a day (BID) | CUTANEOUS | 0 refills | Status: DC

## 2019-04-11 MED ORDER — METOPROLOL TARTRATE 25 MG PO TABS: 12.5000 mg | ORAL_TABLET | Freq: Two times a day (BID) | ORAL | 1 refills | Status: DC

## 2019-04-11 MED ORDER — AMOXICILLIN-POT CLAVULANATE 875-125 MG PO TABS: 1.0000 | ORAL_TABLET | Freq: Two times a day (BID) | ORAL | 0 refills | Status: DC

## 2019-04-11 MED ORDER — ONDANSETRON HCL 4 MG PO TABS: 4.0000 mg | ORAL_TABLET | Freq: Four times a day (QID) | ORAL | 0 refills | Status: DC | PRN

## 2019-04-11 MED ORDER — ACETAMINOPHEN 325 MG PO TABS: 650.0000 mg | ORAL_TABLET | Freq: Four times a day (QID) | ORAL | 0 refills | Status: DC | PRN

## 2019-04-11 MED ORDER — OXYCODONE-ACETAMINOPHEN 5-325 MG PO TABS: 1.0000 | ORAL_TABLET | ORAL | 0 refills | Status: DC | PRN

## 2019-04-11 MED ORDER — HYDROCODONE-ACETAMINOPHEN 5-325 MG PO TABS: 1.0000 | ORAL_TABLET | Freq: Four times a day (QID) | ORAL | 0 refills | Status: DC | PRN

## 2019-04-11 MED ORDER — AMOXICILLIN-POT CLAVULANATE 875-125 MG PO TABS: 1.0000 | ORAL_TABLET | Freq: Two times a day (BID) | ORAL | 0 refills | Status: AC

## 2019-04-11 MED ORDER — MUPIROCIN 2 % EX OINT: 1.0000 "application " | TOPICAL_OINTMENT | Freq: Two times a day (BID) | CUTANEOUS | 0 refills | Status: AC

## 2019-04-11 MED ORDER — POTASSIUM CHLORIDE 20 MEQ PO PACK
40.0000 meq | PACK | Freq: Once | ORAL | Status: AC
  Administered 2019-04-11: 40 meq via ORAL
  Filled 2019-04-11: qty 2

## 2019-04-11 NOTE — Progress Notes (Signed)
Discharged patient to home, AVS given and explained, equipments delivered. Questions answered to patient's satisfaction. Belongings returned accordingly.

## 2019-04-11 NOTE — Evaluation (Signed)
Occupational Therapy Evaluation Patient Details Name: Garrett [name] MRN: [id number] DOB: Sep 26, 1985 Today's Date: 04/11/2019    History of Present Illness 34 yo admitted with Rt great toe pain with osteomyelitis s/p amputation. PMhx: DM   Clinical Impression   PTA patient independent. Admitted for above and limited by problem list below, including TDWB to R LE, impaired balance and decreased activity tolerance. Patient demonstrating ability to complete LB ADLs with supervision, toilet transfers with supervision, tub transfers simulated with min guard assist. Functional mobility in room with min guard, using crutches per patient request. Discussed safety, precautions and recommendations. Patient will benefit from continued OT services while admitted to ensure safety with tub transfers and ADL performance to progress towards modified independent level. Anticipate no further needs after dc.     Follow Up Recommendations  No OT follow up    Equipment Recommendations  3 in 1 bedside commode    Recommendations for Other Services       Precautions / Restrictions Precautions Required Braces or Orthoses: Other Brace Other Brace: post op shoe R LE  Restrictions Weight Bearing Restrictions: Yes RLE Weight Bearing: Touchdown weight bearing      Mobility Bed Mobility               General bed mobility comments: OOB in recliner upon entry   Transfers Overall transfer level: Needs assistance Equipment used: Crutches Transfers: Sit to/from Stand Sit to Stand: Supervision         General transfer comment: for safety     Balance Overall balance assessment: Mild deficits observed, not formally tested                                         ADL either performed or assessed with clinical judgement   ADL Overall ADL's : Needs assistance/impaired     Grooming: Supervision/safety;Set up;Standing Grooming Details (indicate cue type and reason): discussed  modified techniques with foward lean at sink if needing to stand  Upper Body Bathing: Set up   Lower Body Bathing: Supervison/ safety;Sit to/from stand Lower Body Bathing Details (indicate cue type and reason): simulated technique  Upper Body Dressing : Set up   Lower Body Dressing: Supervision/safety Lower Body Dressing Details (indicate cue type and reason): discussed compenstory techniques for LB dressing, safety seated  Toilet Transfer: Supervision/safety;Ambulation(crutches)       Tub/ Shower Transfer: Tub transfer;Min guard;3 in 1;Grab bars Tub/Shower Transfer Details (indicate cue type and reason): simulated technique, using grabbars and crutches; sitting before bringing R LE into tub  Functional mobility during ADLs: Supervision/safety(crutches) General ADL Comments: supervision for safety     Vision         Perception     Praxis      Pertinent Vitals/Pain Pain Assessment: Faces Faces Pain Scale: Hurts a little bit Pain Location: R foot  Pain Descriptors / Indicators: Discomfort;Operative site guarding Pain Intervention(s): Monitored during session;Repositioned     Hand Dominance Right   Extremity/Trunk Assessment Upper Extremity Assessment Upper Extremity Assessment: Overall WFL for tasks assessed   Lower Extremity Assessment Lower Extremity Assessment: Defer to PT evaluation       Communication Communication Communication: No difficulties   Cognition Arousal/Alertness: Awake/alert Behavior During Therapy: WFL for tasks assessed/performed Overall Cognitive Status: Within Functional Limits for tasks assessed  General Comments       Exercises     Shoulder Instructions      Home Living Family/patient expects to be discharged to:: Private residence Living Arrangements: Non-relatives/Friends Available Help at Discharge: Available 24 hours/day;Friend(s) Type of Home: Apartment Home Access: Stairs  to enter [employer] of Steps: 8 then 5 Entrance Stairs-Rails: Right;Left Home Layout: One level     Bathroom Shower/Tub: Chief Strategy Officer: Standard     Home Equipment: None          Prior Functioning/Environment Level of Independence: Independent        Comments: works Glass blower/designer), drives         OT Problem List: Impaired balance (sitting and/or standing);Pain;Decreased knowledge of use of DME or AE;Decreased knowledge of precautions      OT Treatment/Interventions: Self-care/ADL training;Therapeutic exercise;DME and/or AE instruction;Balance training;Patient/family education;Therapeutic activities    OT Goals(Current goals can be found in the care plan section) Acute Rehab OT Goals Patient Stated Goal: to get home  OT Goal Formulation: With patient Time For Goal Achievement: 04/25/19 Potential to Achieve Goals: Good  OT Frequency: Min 2X/week   Barriers to D/C:            Co-evaluation              AM-PAC OT "6 Clicks" Daily Activity     Outcome Measure Help from another person eating meals?: None Help from another person taking care of personal grooming?: A Little Help from another person toileting, which includes using toliet, bedpan, or urinal?: A Little Help from another person bathing (including washing, rinsing, drying)?: A Little Help from another person to put on and taking off regular upper body clothing?: A Little Help from another person to put on and taking off regular lower body clothing?: A Little 6 Click Score: 19   End of Session Equipment Utilized During Treatment: Gait belt;Other (comment)(crutches) Nurse Communication: Mobility status  Activity Tolerance: Patient tolerated treatment well Patient left: in chair;with call bell/phone within reach  OT Visit Diagnosis: Pain Pain - Right/Left: Right Pain - part of body: Ankle and joints of foot                Time: 8563-1497 OT Time Calculation (min): 20  min Charges:  OT General Charges $OT Visit: 1 Visit OT Evaluation $OT Eval Low Complexity: 1 Low  Barry Brunner, OT Acute Rehabilitation Services Pager [phone] Office [phone]   Chancy Milroy 04/11/2019, 11:31 AM

## 2019-04-11 NOTE — Discharge Summary (Signed)
Physician Discharge Summary  [place] TGG:[id number] DOB: 1986/01/22 DOA: 04/07/2019  PCP: Patient, No Pcp Per  Admit date: 04/07/2019 Discharge date: 04/11/2019 Consultations: Diabetes education, orthopedics Admitted From: Home Disposition: Home  Discharge Diagnoses:  Principal Problem:   Cellulitis Active Problems:   Diabetes mellitus with hyperglycemia (HCC)   Leukocytosis   Lactic acidosis   Abscess of great toe, right   Diabetic polyneuropathy associated with type 1 diabetes mellitus (HCC)   Osteomyelitis of great toe of right foot [facility])   Hospital Course Summary: 34 y.o.malewith h/o DM type 1 who moved to [place] few yrs ago and lost in PCP f/u, self managing insulin dosing, presented to ED on 2/21 with rightgreat toe pain and swelling. Symptoms started about 3 weeks ago. He is not quite sure about how/when he hurt his foot, but he has had swelling in both feet recently. He had been trying to keep the wound clean at home. However over the last 2 to 3 days his right great toe had turned in color and was significantly more painful. He feels like he is lost some of his feeling in that toe. He moved to [place] from [place] 2 to 3 years ago where he had Medicaid. Since that time he has not yet established care with a primary care provider and has been self treating his blood sugars with insulin that he buys at [facility]. Current regimen includes 30 units of 70/30 insulin twice daily and uses a sliding scale of regular insulin as needed. Admits to diet non compliance with diabetic diet at home. States he was advised to use 70/30 insulin 30 units BID when admitted at [facility] a year ago with BG >300 at that time. He states his BG runs around 220 to 270 at home with the regimen.Has been running out of supplies lately. [place] not have Medicaid in [place]. Wants new PCP referral ED Course: Afebrile, pulse 98-104, blood pressure 145/96, and all other vital signs  maintained. Labs significant for WBC 15.5, hemoglobin 10.9, sodium 130, CO2 23, glucose 539, andanion gap 13. Lactic acid 3.9.Urinalysis was positive for glucose, but negative for ketones or infection. X-rays of the right foot reveal diffuse swelling without bony involvement. Patient was started on Zoysn. Blood cultures sent. Hospital course: Patient admitted to [facility] for further evaluation and management of diabetic foot infection/hyperglycemia. He was started on empiric antibiotics and insulin drip.   Diabetic foot/right great toe osteomyelitis with surrounding cellulitis and sepsis syndrome POA: Patient febrile with tachycardia,  leucocytosis and lactic acidosis on presentation--started of empiric abx -received Zosyn in ED and admitted with IV Ancef-changed to ceftriaxone on 2/22.  Venous doppler LE -ve for DVT and arterial Dopplers showed adequate circulation. Consulted orthopedics given inadequate response to antibiotics and there was concern for underlying abscess/osteomyelitis-patient underwent great toe amputation on 2/24 by orthopedics and tolerated well.  Leucocytosis now resolved. Will discharge on oral abx for another 5 days to complete abx course for cellulitis.  Patient was provided symptomatic treatment with pain meds, local dressings-crutches while here.  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  Per orthopedics, patient should elevate leg and avoid weightbearing on foot. Change dressing as needed follow up Dr. Lajoyce Corners 1 week .He will need to be out of work for at least a week due to weightbearing restrictions.  Patient seen by physical therapy and recommended walker/3 in 1 commode which have been issued prior to discharge.  Diabetes mellitus type1, uncontrolled  with hyperglycemia:POA. Patient presented with blood glucose elevated up to 539 in the setting of infection and erratic home regimen. CO2 23 and anion gap 13.Urinalysis was positive for glucose, but negative  for ketones.DKA ruled out.  Last hemoglobin A1c noted to be 13.4 on 08/15/2018. Home regimen includes 70/30 insulin 30 units twice daily and sliding scale insulin as needed regular insulin. Patient does not have a primary care provider and was filling medications from [facility]. He was initially started on a insulin drip in ED- transitioned to home regimen (70/30 insulin 30units BID) at admission. Patient however had hypoglycemia later in the night with BG dropping to 30s and started on dextrose drip- discontinued within 12 hours. Appreciate diabetes education consult and adjusted 70/30 insulin per their recommendations.  Patient advised to stick to diabetic diet and to avoid snacks.  His blood glucose appeared to be well controlled on 20 units of 70/30 insulin twice daily while here although he did have significantly elevated blood glucose up to 400s yesterday postoperatively but states he ate a "white icing cake" and some snacks when he came back to his room from PACU.  I did discuss with him extensively regarding the importance of compliance with diabetic diet, consistent oral intake and close monitoring of blood glucose levels at home.  He plans to buy a new glucometer along with strips as advised by diabetes educator. Will need PCP upon d/c--TOC consulted  Mild hyponatremia: Secondary to dehydration versus pseudohyponatremia in the setting of hyperglycemia.  Repeat labs show improvement.  Hypertension: Patient had elevated blood pressures during the hospital course in the setting of infection and pain.  Started on metoprolol while here as he was also somewhat tachycardic.  Will add lisinopril 5 mg upon discharge given history of diabetes mellitus.  Follow-up PCP for further adjustments.  Mild hypokalemia: Likely dilutional from IV hydration.  Replaced prior to discharge with p.o. dose x1.  Toe pain: Patient was taking BC powders at home. Advised of GI bleed risk.  Will prescribe short-term opiates for  postop pain.  Discharge Exam:  Vitals:   04/11/19 1017 04/11/19 1408  BP: 133/77 (!) 168/103  Pulse: 92 98  Resp: 18 16  Temp: 98.2 F (36.8 C) 98.1 F (36.7 C)  SpO2: 100% 100%   Vitals:   04/11/19 0226 04/11/19 0532 04/11/19 1017 04/11/19 1408  BP: (!) 147/98 140/88 133/77 (!) 168/103  Pulse: 85 88 92 98  Resp: 17 17 18 16   Temp: 98.2 F (36.8 C) 98.3 F (36.8 C) 98.2 F (36.8 C) 98.1 F (36.7 C)  TempSrc: Oral Oral Oral Oral  SpO2: 100% 100% 100% 100%  Weight:      Height:        General: Pt is alert, awake, not in acute distress Cardiovascular: RRR, S1/S2 +, no rubs, no gallops Respiratory: CTA bilaterally, no wheezing, no rhonchi Abdominal: Soft, NT, ND, bowel sounds + Extremities: no edema, dressing along right foot  Discharge Condition:Stable CODE STATUS: Full code Diet recommendation: Heart healthy, diabetic diet Recommendations for Outpatient Follow-up:  1. Follow up with PCP: 1 to 2 weeks 2. Follow up with consultants: Dr. in 1 week 3. Please obtain follow up labs including: BMP  Home Health services upon discharge: None Equipment/Devices upon discharge: Rolling walker with 5 inch wheels, 3 in 1   Discharge Instructions:  Discharge Instructions    Call MD for:  persistant dizziness or light-headedness   Complete by: As directed    Call MD for:  redness, tenderness, or signs of infection (pain, swelling, redness, odor or green/yellow discharge around incision site)   Complete by: As directed    Call MD for:  temperature >100.4   Complete by: As directed    Diet - low sodium heart healthy   Complete by: As directed    Increase activity slowly   Complete by: As directed      Allergies as of 04/11/2019   No Known Allergies     Medication List    STOP taking these medications   BC HEADACHE POWDER PO     TAKE these medications   acetaminophen 325 MG tablet Commonly known as: TYLENOL Take 2 tablets (650 mg total) by mouth every 6 (six)  hours as needed for mild pain (or Fever >/= 101).   amoxicillin-clavulanate 875-125 MG tablet Commonly known as: Augmentin Take 1 tablet by mouth 2 (two) times daily for 5 days.   HYDROcodone-acetaminophen 5-325 MG tablet Commonly known as: NORCO/VICODIN Take 1 tablet by mouth every 6 (six) hours as needed for moderate pain.   lisinopril 5 MG tablet Commonly known as: ZESTRIL Take 1 tablet (5 mg total) by mouth daily.   metoprolol tartrate 25 MG tablet Commonly known as: LOPRESSOR Take 0.5 tablets (12.5 mg total) by mouth 2 (two) times daily.   multivitamin with minerals Tabs tablet Take 1 tablet by mouth daily. Start taking on: April 12, 2019   mupirocin ointment 2 % Commonly known as: BACTROBAN Place 1 application into the nose 2 (two) times daily for 4 days.   NovoLIN 70/30 (70-30) 100 UNIT/ML injection Generic drug: insulin NPH-regular Human Inject 20 Units into the skin See admin instructions. Inject 30 units into the skin before breakfast and 30 units at bedtime What changed: how much to take   NovoLIN R 100 units/mL injection Generic drug: insulin regular Inject 3-4 Units into the skin See admin instructions. Inject 3-4 units into the skin "as needed" two times a day (with breakfast and dinner) per sliding scale   ondansetron 4 MG tablet Commonly known as: ZOFRAN Take 1 tablet (4 mg total) by mouth every 6 (six) hours as needed for nausea.            Durable Medical Equipment  (From admission, onward)         Start     Ordered   04/11/19 1013  For home use only DME 3 n 1  Once     04/11/19 1012   04/11/19 1012  For home use only DME Walker rolling  Once    Question Answer Comment  Walker: With 5 Inch Wheels   Patient needs a walker to treat with the following condition Weakness      04/11/19 1012         Follow-up Information    [facility] Schedule an appointment as soon as possible for a visit.   Specialty: Primary  Care Contact information: [address] [place] [id number] [phone]       Nadara Mustarduda, Marcus V, MD In 1 week.   Specialty: Orthopedic Surgery Contact information: [address] [place] [address] [phone]          No Known Allergies    The results of significant diagnostics from this hospitalization (including imaging, microbiology, ancillary and laboratory) are listed below for reference.    Labs: BNP (last 3 results) No results for input(s): BNP in the last 8760 hours. Basic Metabolic  Panel: Recent Labs  Lab 04/07/19 1240 04/08/19 0256 04/09/19 1443 04/10/19 0226 04/11/19 0743  NA 130* 132* 128* 133* 137  K 4.1 3.6 4.2 3.8 3.4*  CL 94* 100 97* 100 105  CO2 23 23 24 23 24   GLUCOSE 539* 55* 389* 200* 68*  BUN 10 8 10 10 6   CREATININE 0.90 0.73 0.89 0.80 0.61  CALCIUM 9.0 7.8* 8.3* 8.2* 7.9*   Liver Function Tests: No results for input(s): AST, ALT, ALKPHOS, BILITOT, PROT, ALBUMIN in the last 168 hours. No results for input(s): LIPASE, AMYLASE in the last 168 hours. No results for input(s): AMMONIA in the last 168 hours. CBC: Recent Labs  Lab 04/07/19 1240 04/08/19 0256 04/09/19 1443 04/10/19 0226 04/11/19 0743  WBC 15.5* 12.9* 12.6* 10.9* 8.6  NEUTROABS 13.4*  --   --   --   --   HGB 10.9* 9.1* 10.5* 9.6* 9.4*  HCT 32.7* 27.5* 32.6* 28.6* 29.0*  MCV 94.5 93.9 95.0 92.3 93.9  PLT 316 244 352 351 434*   Cardiac Enzymes: No results for input(s): CKTOTAL, CKMB, CKMBINDEX, TROPONINI in the last 168 hours. BNP: Invalid input(s): POCBNP CBG: Recent Labs  Lab 04/10/19 2030 04/10/19 2216 04/11/19 0509 04/11/19 0729 04/11/19 1126  GLUCAP 311* 184* 105* 72 117*   D-Dimer No results for input(s): DDIMER in the last 72 hours. Hgb A1c No results for input(s): HGBA1C in the last 72 hours. Lipid Profile No results for input(s): CHOL, HDL, LDLCALC, TRIG, CHOLHDL, LDLDIRECT in the last 72 hours. Thyroid  function studies No results for input(s): TSH, T4TOTAL, T3FREE, THYROIDAB in the last 72 hours.  Invalid input(s): FREET3 Anemia work up No results for input(s): VITAMINB12, FOLATE, FERRITIN, TIBC, IRON, RETICCTPCT in the last 72 hours. Urinalysis    Component Value Date/Time   COLORURINE STRAW (A) 04/07/2019 1405   APPEARANCEUR CLEAR 04/07/2019 1405   LABSPEC 1.029 04/07/2019 1405   PHURINE 6.0 04/07/2019 1405   GLUCOSEU >=500 (A) 04/07/2019 1405   HGBUR NEGATIVE 04/07/2019 1405   BILIRUBINUR NEGATIVE 04/07/2019 1405   KETONESUR NEGATIVE 04/07/2019 1405   PROTEINUR NEGATIVE 04/07/2019 1405   NITRITE NEGATIVE 04/07/2019 1405   LEUKOCYTESUR NEGATIVE 04/07/2019 1405   Sepsis Labs Invalid input(s): PROCALCITONIN,  WBC,  LACTICIDVEN Microbiology Recent Results (from the past 240 hour(s))  Culture, blood (Routine X 2) w Reflex to ID Panel     Status: None (Preliminary result)   Collection Time: 04/07/19  2:26 PM   Specimen: BLOOD LEFT WRIST  Result Value Ref Range Status   Specimen Description BLOOD LEFT WRIST  Final   Special Requests   Final    BOTTLES DRAWN AEROBIC AND ANAEROBIC Blood Culture adequate volume   Culture   Final    NO GROWTH 3 DAYS Performed at [facility], [address] [place], [address] [place]    Report Status PENDING  Incomplete  Culture, blood (Routine X 2) w Reflex to ID Panel     Status: None (Preliminary result)   Collection Time: 04/07/19  2:31 PM   Specimen: BLOOD RIGHT WRIST  Result Value Ref Range Status   Specimen Description BLOOD RIGHT WRIST  Final   Special Requests   Final    BOTTLES DRAWN AEROBIC AND ANAEROBIC Blood Culture results may not be optimal due to an inadequate volume of blood received in culture bottles   Culture   Final    NO GROWTH 3 DAYS Performed at [facility], [address]  [place], [place] [address]    Report Status PENDING  Incomplete  SARS CORONAVIRUS 2 (TAT 6-24 HRS) Nasopharyngeal Nasopharyngeal  Swab     Status: None   Collection Time: 04/07/19  2:50 PM   Specimen: Nasopharyngeal Swab  Result Value Ref Range Status   SARS Coronavirus 2 NEGATIVE NEGATIVE Final    Comment: (NOTE) SARS-CoV-2 target nucleic acids are NOT DETECTED. The SARS-CoV-2 RNA is generally detectable in upper and lower respiratory specimens during the acute phase of infection. Negative results do not preclude SARS-CoV-2 infection, do not rule out co-infections with other pathogens, and should not be used as the sole basis for treatment or other patient management decisions. Negative results must be combined with clinical observations, patient history, and epidemiological information. The expected result is Negative. Fact Sheet for Patients: SugarRoll.be Fact Sheet for Healthcare Providers: https://www.woods-mathews.com/ This test is not yet approved or cleared by the [place] FDA and  has been authorized for detection and/or diagnosis of SARS-CoV-2 by FDA under an Emergency Use Authorization (EUA). This EUA will remain  in effect (meaning this test can be used) for the duration of the COVID-19 declaration under Section 56 4(b)(1) of the Act, 21 U.S.C. section 360bbb-3(b)(1), unless the authorization is terminated or revoked sooner. Performed at [facility], [place] [address], [place], [place] [address]   Surgical pcr screen     Status: Abnormal   Collection Time: 04/10/19  8:44 AM   Specimen: Nasal Mucosa; Nasal Swab  Result Value Ref Range Status   MRSA, PCR NEGATIVE NEGATIVE Final   Staphylococcus aureus POSITIVE (A) NEGATIVE Final    Comment: (NOTE) The Xpert SA Assay (FDA approved for NASAL specimens in patients 40 years of age and older), is one component of a comprehensive surveillance program. It is not intended to diagnose infection nor to guide or monitor treatment. Performed at [facility], [place] [address], [place],  [place] [address]     Procedures/Studies: DG Foot Complete Right  Result Date: 04/07/2019 CLINICAL DATA:  Right foot pain and swelling in a diabetic patient. No known injury. EXAM: RIGHT FOOT COMPLETE - 3+ VIEW COMPARISON:  None. FINDINGS: Soft tissues are diffusely swollen. No soft tissue gas or radiopaque foreign body. No bony destructive change or periosteal reaction. Bones and joints appear normal. Atherosclerosis is noted. IMPRESSION: Diffuse soft tissue swelling. No bony or joint abnormality. Atherosclerosis. Electronically Signed   By: Inge Rise M.D.   On: 04/07/2019 12:36   VAS [place] ABI WITH/WO TBI  Result Date: 04/09/2019 LOWER EXTREMITY DOPPLER STUDY Indications: Ulceration.  Limitations: Today's exam was limited due to great toe too swollen for toe cuff. Performing Technologist: Lita Mains RDMS, RVT  Examination Guidelines: A complete evaluation includes at minimum, Doppler waveform signals and systolic blood pressure reading at the level of bilateral brachial, anterior tibial, and posterior tibial arteries, when vessel segments are accessible. Bilateral testing is considered an integral part of a complete examination. Photoelectric Plethysmograph (PPG) waveforms and toe systolic pressure readings are included as required and additional duplex testing as needed. Limited examinations for reoccurring indications may be performed as noted.  ABI Findings: +--------+------------------+-----+---------+--------+ Right   Rt Pressure (mmHg)IndexWaveform Comment  +--------+------------------+-----+---------+--------+ [id number]                    triphasic         +--------+------------------+-----+---------+--------+ PTA     208  1.26 biphasic          +--------+------------------+-----+---------+--------+ DP      214               1.30 biphasic          +--------+------------------+-----+---------+--------+  +--------+------------------+-----+---------+---------------+ Left    Lt Pressure (mmHg)IndexWaveform Comment         +--------+------------------+-----+---------+---------------+ [id number]                    triphasic                +--------+------------------+-----+---------+---------------+ PTA     175               1.06 triphasic                +--------+------------------+-----+---------+---------------+ DP      255               1.55 triphasicnoncompressible +--------+------------------+-----+---------+---------------+  Summary: Right: Resting right ankle-brachial index is within normal range. No evidence of significant right lower extremity arterial disease. Left: Resting left ankle-brachial index is within normal range. No evidence of significant left lower extremity arterial disease.  *See table(s) above for measurements and observations.  Electronically signed by Lemar Livings MD on 04/09/2019 at 6:07:06 PM.   Final    VAS [place] LOWER EXTREMITY VENOUS (DVT)  Result Date: 04/09/2019  Lower Venous DVTStudy Indications: Pain.  Comparison Study: No prior study Performing Technologist: Gertie Fey RDMS, RVT, RDCS  Examination Guidelines: A complete evaluation includes B-mode imaging, spectral Doppler, color Doppler, and power Doppler as needed of all accessible portions of each vessel. Bilateral testing is considered an integral part of a complete examination. Limited examinations for reoccurring indications may be performed as noted. The reflux portion of the exam is performed with the patient in reverse Trendelenburg.  +---------+---------------+---------+-----------+----------+--------------+ RIGHT    CompressibilityPhasicitySpontaneityPropertiesThrombus Aging +---------+---------------+---------+-----------+----------+--------------+ CFV      Full           Yes      Yes                                  +---------+---------------+---------+-----------+----------+--------------+ SFJ      Full                                                        +---------+---------------+---------+-----------+----------+--------------+ FV Prox  Full                                                        +---------+---------------+---------+-----------+----------+--------------+ FV Mid   Full                                                        +---------+---------------+---------+-----------+----------+--------------+ FV DistalFull                                                        +---------+---------------+---------+-----------+----------+--------------+  PFV      Full                                                        +---------+---------------+---------+-----------+----------+--------------+ POP      Full           Yes      Yes                                 +---------+---------------+---------+-----------+----------+--------------+ PTV      Full                                                        +---------+---------------+---------+-----------+----------+--------------+ PERO     Full                                                        +---------+---------------+---------+-----------+----------+--------------+   +----+---------------+---------+-----------+----------+--------------+ LEFTCompressibilityPhasicitySpontaneityPropertiesThrombus Aging +----+---------------+---------+-----------+----------+--------------+ CFV Full           Yes      Yes                                 +----+---------------+---------+-----------+----------+--------------+     Summary: RIGHT: - There is no evidence of deep vein thrombosis in the lower extremity.  - No cystic structure found in the popliteal fossa. - Ultrasound characteristics of enlarged lymph nodes are noted in the groin.  LEFT: - No evidence of common femoral vein obstruction.  *See table(s) above  for measurements and observations. Electronically signed by Fabienne Brunsharles Fields MD on 04/09/2019 at 11:53:53 AM.    Final     Time coordinating discharge: Over 30 minutes  SIGNED:   Alessandra BevelsNeelima Garvey [place], MD  [facility] 04/11/2019, 2:20 PM

## 2019-04-11 NOTE — Evaluation (Signed)
Physical Therapy Evaluation/ Discharge Patient Details Name: Garrett [name] MRN: [id number] DOB: [birth year] Today's Date: 04/11/2019   History of Present Illness  34 yo admitted with Rt great toe pain with osteomyelitis s/p amputation. PMhx: DM  Clinical Impression  Pt very pleasant and moving well. Pt moving to and from bathroom with RW on his own and was able to demonstrate basic transfers, gait and stairs without physical assist. Pt educated for weight bearing status, use of RW and safety for home. Pt also educated for checking skin integrity daily as a habit. All education completed and no further needs at this time with pt aware and agreeable.     Follow Up Recommendations No PT follow up    Equipment Recommendations  Rolling walker with 5" wheels;3in1 (PT)    Recommendations for Other Services       Precautions / Restrictions Precautions Required Braces or Orthoses: Other Brace Other Brace: post op shoe R LE  Restrictions Weight Bearing Restrictions: Yes RLE Weight Bearing: Touchdown weight bearing      Mobility  Bed Mobility Overal bed mobility: Modified Independent             General bed mobility comments: OOB in recliner upon entry   Transfers Overall transfer level: Modified independent Equipment used: Crutches Transfers: Sit to/from Stand Sit to Stand: Supervision         General transfer comment: for safety   Ambulation/Gait Ambulation/Gait assistance: Supervision Gait Distance (Feet): 200 Feet Assistive device: Rolling walker (2 wheeled) Gait Pattern/deviations: Step-to pattern   Gait velocity interpretation: >2.62 ft/sec, indicative of community ambulatory General Gait Details: cues for position in RW and safety  Stairs Stairs: Yes Stairs assistance: Supervision Stair Management: Step to pattern;One rail Right Number of Stairs: 8 General stair comments: pt able to use bil hands on Right rail and hop up and down stairs after initial  demonstration for sequence  Wheelchair Mobility    Modified Rankin (Stroke Patients Only)       Balance Overall balance assessment: No apparent balance deficits (not formally assessed)                                           Pertinent Vitals/Pain Pain Assessment: No/denies pain Faces Pain Scale: Hurts a little bit Pain Location: R foot  Pain Descriptors / Indicators: Discomfort;Operative site guarding Pain Intervention(s): Monitored during session;Repositioned    Home Living Family/patient expects to be discharged to:: Private residence Living Arrangements: Non-relatives/Friends Available Help at Discharge: Available 24 hours/day;Friend(s) Type of Home: Apartment Home Access: Stairs to enter Entrance Stairs-Rails: Psychiatric nurse of Steps: 8 then 5 Home Layout: One level Home Equipment: None      Prior Function Level of Independence: Independent         Comments: works Scientist, physiological), drives      Journalist, newspaper   Dominant Hand: Right    Extremity/Trunk Assessment   Upper Extremity Assessment Upper Extremity Assessment: Overall WFL for tasks assessed    Lower Extremity Assessment Lower Extremity Assessment: Overall WFL for tasks assessed    Cervical / Trunk Assessment Cervical / Trunk Assessment: Normal  Communication   Communication: No difficulties  Cognition Arousal/Alertness: Awake/alert Behavior During Therapy: WFL for tasks assessed/performed Overall Cognitive Status: Within Functional Limits for tasks assessed  General Comments      Exercises     Assessment/Plan    PT Assessment Patent does not need any further PT services  PT Problem List         PT Treatment Interventions      PT Goals (Current goals can be found in the Care Plan section)  Acute Rehab PT Goals Patient Stated Goal: to get to [facility] tomorrow PT Goal Formulation: All assessment and  education complete, DC therapy    Frequency     Barriers to discharge        Co-evaluation               AM-PAC PT "6 Clicks" Mobility  Outcome Measure Help needed turning from your back to your side while in a flat bed without using bedrails?: None Help needed moving from lying on your back to sitting on the side of a flat bed without using bedrails?: None Help needed moving to and from a bed to a chair (including a wheelchair)?: None Help needed standing up from a chair using your arms (e.g., wheelchair or bedside chair)?: None Help needed to walk in hospital room?: None Help needed climbing 3-5 steps with a railing? : None 6 Click Score: 24    End of Session   Activity Tolerance: Patient tolerated treatment well Patient left: in chair;with call bell/phone within reach Nurse Communication: Mobility status PT Visit Diagnosis: Other abnormalities of gait and mobility (R26.89)    Time: 0347-4259 PT Time Calculation (min) (ACUTE ONLY): 17 min   Charges:   PT Evaluation $PT Eval Moderate Complexity: 1 Mod          Vallarie Fei P, PT Acute Rehabilitation Services Pager: [phone] Office: [phone]   Enedina Finner Melita Villalona 04/11/2019, 11:36 AM

## 2019-04-11 NOTE — Progress Notes (Signed)
POD1  Great Toe Amputation. Had some increased pain last night. Is up and about in room. Requesting Discharge today as he has to attend a family funeral in [place]  VSS alert ambulating with walker. Dressing Clean dry and in place.  Stable POD # 1. I will write rx for pain medication. Should elevate leg and avoid weightbearing on foot. Change dressing as needed follow up Dr. Lajoyce Corners 1 week From orthopedic standpoint can discharge today

## 2019-04-11 NOTE — TOC Initial Note (Signed)
Transition of Care [facility]) - Initial/Assessment Note    Patient Details  Name: Garrett [name] MRN: [id number] Date of Birth: 02/08/1986  [facility]) CM/SW Contact:    Kingsley Plan, RN Phone Number: 04/11/2019, 10:16 AM  Clinical Narrative:                 See prior note. Patient requesting walker and 3 in 1 (PT rec) same ordered through [employer]. Patient has Medicaid of [place] , Zack with [employer] will discuss payment.     Expected Discharge Plan: Home/Self Care Barriers to Discharge: No Barriers Identified   Patient Goals and CMS Choice Patient states their goals for this hospitalization and ongoing recovery are:: to return to home CMS Medicare.gov Compare Post Acute Care list provided to:: Patient Choice offered to / list presented to : NA  Expected Discharge Plan and Services Expected Discharge Plan: Home/Self Care   Discharge Planning Services: CM Consult   Living arrangements for the past 2 months: Single Family Home                 DME Arranged: 3-N-1, Walker rolling DME Agency: [employer] Date DME Agency Contacted: 04/11/19 Time DME Agency Contacted: 1014 Representative spoke with at DME Agency: Zack HH Arranged: NA          Prior Living Arrangements/Services Living arrangements for the past 2 months: Single Family Home Lives with:: Relatives Patient language and need for interpreter reviewed:: Yes Do you feel safe going back to the place where you live?: Yes      Need for Family Participation in Patient Care: Yes (Comment) Care giver support system in place?: Yes (comment)   Criminal Activity/Legal Involvement Pertinent to Current Situation/Hospitalization: No - Comment as needed  Activities of Daily Living      Permission Sought/Granted Permission sought to share information with : Case Manager Permission granted to share information with : No              Emotional Assessment Appearance:: Appears stated  age Attitude/Demeanor/Rapport: Engaged Affect (typically observed): Accepting Orientation: : Oriented to Self, Oriented to Place, Oriented to  Time, Oriented to Situation Alcohol / Substance Use: Not Applicable Psych Involvement: No (comment)  Admission diagnosis:  Cellulitis [L03.90] Hyperglycemia [R73.9] Diabetic foot infection (HCC) [W09.811, L08.9] Patient Active Problem List   Diagnosis Date Noted  . Abscess of great toe, right   . Diabetic polyneuropathy associated with type 1 diabetes mellitus (HCC)   . Osteomyelitis of great toe of right foot (HCC)   . Cellulitis 04/07/2019  . Leukocytosis 04/07/2019  . Lactic acidosis 04/07/2019  . Left arm cellulitis 08/15/2018  . Diabetes mellitus with hyperglycemia (HCC) 08/15/2018  . DKA (diabetic ketoacidoses) (HCC) 03/27/2017   PCP:  Patient, No Pcp Per Pharmacy:   [facility] [address] ([address]), [place] - [address] [place] ([place]) [place] [address] Phone: [phone] Fax: [phone]  [facility] [address] ([address]), [place] - [address] [place] ([place]) [place] [address] Phone: [phone] Fax: [phone]  [facility] [place], [place] - [address] [place] [address] Phone: [phone] Fax: [phone]     Social Determinants of Health (SDOH) Interventions    Readmission Risk Interventions No flowsheet data found.

## 2019-04-16 LAB — CULTURE, BLOOD (ROUTINE X 2)
Culture: NO GROWTH
Culture: NO GROWTH
Special Requests: ADEQUATE

## 2019-04-17 ENCOUNTER — Ambulatory Visit (INDEPENDENT_AMBULATORY_CARE_PROVIDER_SITE_OTHER): Payer: Self-pay | Admitting: Family

## 2019-04-17 ENCOUNTER — Encounter: Payer: Self-pay | Admitting: Family

## 2019-04-17 ENCOUNTER — Other Ambulatory Visit: Payer: Self-pay

## 2019-04-17 VITALS — Ht 69.0 in | Wt 150.0 lb

## 2019-04-17 DIAGNOSIS — M869 Osteomyelitis, unspecified: Secondary | ICD-10-CM

## 2019-04-17 DIAGNOSIS — L02611 Cutaneous abscess of right foot: Secondary | ICD-10-CM

## 2019-04-17 MED ORDER — OXYCODONE-ACETAMINOPHEN 5-325 MG PO TABS: 1.0000 | ORAL_TABLET | Freq: Four times a day (QID) | ORAL | 0 refills | Status: DC | PRN

## 2019-04-23 ENCOUNTER — Encounter (HOSPITAL_COMMUNITY): Payer: Self-pay | Admitting: Emergency Medicine

## 2019-04-23 ENCOUNTER — Emergency Department (HOSPITAL_COMMUNITY): Payer: Medicaid - Out of State

## 2019-04-23 ENCOUNTER — Inpatient Hospital Stay (HOSPITAL_COMMUNITY)
Admission: EM | Admit: 2019-04-23 | Discharge: 2019-04-25 | DRG: 637 | Disposition: A | Discharge status: Home / Self Care | Payer: Medicaid - Out of State | Attending: Internal Medicine | Admitting: Internal Medicine

## 2019-04-23 ENCOUNTER — Other Ambulatory Visit: Payer: Self-pay

## 2019-04-23 DIAGNOSIS — Z89411 Acquired absence of right great toe: Secondary | ICD-10-CM

## 2019-04-23 DIAGNOSIS — D649 Anemia, unspecified: Secondary | ICD-10-CM | POA: Diagnosis present

## 2019-04-23 DIAGNOSIS — I152 Hypertension secondary to endocrine disorders: Secondary | ICD-10-CM | POA: Diagnosis present

## 2019-04-23 DIAGNOSIS — Z20822 Contact with and (suspected) exposure to covid-19: Secondary | ICD-10-CM | POA: Diagnosis present

## 2019-04-23 DIAGNOSIS — F1290 Cannabis use, unspecified, uncomplicated: Secondary | ICD-10-CM | POA: Diagnosis present

## 2019-04-23 DIAGNOSIS — E1042 Type 1 diabetes mellitus with diabetic polyneuropathy: Secondary | ICD-10-CM | POA: Diagnosis present

## 2019-04-23 DIAGNOSIS — T148XXA Other injury of unspecified body region, initial encounter: Secondary | ICD-10-CM

## 2019-04-23 DIAGNOSIS — Z79899 Other long term (current) drug therapy: Secondary | ICD-10-CM

## 2019-04-23 DIAGNOSIS — E1051 Type 1 diabetes mellitus with diabetic peripheral angiopathy without gangrene: Secondary | ICD-10-CM | POA: Diagnosis present

## 2019-04-23 DIAGNOSIS — M869 Osteomyelitis, unspecified: Secondary | ICD-10-CM | POA: Diagnosis present

## 2019-04-23 DIAGNOSIS — E1010 Type 1 diabetes mellitus with ketoacidosis without coma: Principal | ICD-10-CM | POA: Diagnosis present

## 2019-04-23 DIAGNOSIS — Z79891 Long term (current) use of opiate analgesic: Secondary | ICD-10-CM

## 2019-04-23 DIAGNOSIS — E1065 Type 1 diabetes mellitus with hyperglycemia: Secondary | ICD-10-CM | POA: Diagnosis not present

## 2019-04-23 DIAGNOSIS — G9340 Encephalopathy, unspecified: Secondary | ICD-10-CM | POA: Diagnosis not present

## 2019-04-23 DIAGNOSIS — F17210 Nicotine dependence, cigarettes, uncomplicated: Secondary | ICD-10-CM | POA: Diagnosis present

## 2019-04-23 DIAGNOSIS — Z7289 Other problems related to lifestyle: Secondary | ICD-10-CM

## 2019-04-23 DIAGNOSIS — Z794 Long term (current) use of insulin: Secondary | ICD-10-CM

## 2019-04-23 DIAGNOSIS — G9341 Metabolic encephalopathy: Secondary | ICD-10-CM | POA: Diagnosis present

## 2019-04-23 DIAGNOSIS — E1159 Type 2 diabetes mellitus with other circulatory complications: Secondary | ICD-10-CM | POA: Diagnosis present

## 2019-04-23 DIAGNOSIS — N179 Acute kidney failure, unspecified: Secondary | ICD-10-CM | POA: Diagnosis present

## 2019-04-23 DIAGNOSIS — R748 Abnormal levels of other serum enzymes: Secondary | ICD-10-CM | POA: Diagnosis present

## 2019-04-23 DIAGNOSIS — Z9119 Patient's noncompliance with other medical treatment and regimen: Secondary | ICD-10-CM

## 2019-04-23 LAB — BASIC METABOLIC PANEL
Anion gap: 10 (ref 5–15)
BUN: 24 mg/dL — ABNORMAL HIGH (ref 6–20)
CO2: 22 mmol/L (ref 22–32)
Calcium: 8.7 mg/dL — ABNORMAL LOW (ref 8.9–10.3)
Chloride: 102 mmol/L (ref 98–111)
Creatinine, Ser: 0.85 mg/dL (ref 0.61–1.24)
GFR calc Af Amer: >60 mL/min (ref >60)
GFR calc non Af Amer: >60 mL/min (ref >60)
Glucose, Bld: 140 mg/dL — ABNORMAL HIGH (ref 70–99)
Potassium: 3.9 mmol/L (ref 3.5–5.1)
Sodium: 134 mmol/L — ABNORMAL LOW (ref 135–145)

## 2019-04-23 LAB — CBG MONITORING, ED
Glucose-Capillary: 585 mg/dL (ref 70–99)
Glucose-Capillary: 177 mg/dL — ABNORMAL HIGH (ref 70–99)
Glucose-Capillary: 181 mg/dL — ABNORMAL HIGH (ref 70–99)

## 2019-04-23 LAB — COMPREHENSIVE METABOLIC PANEL
ALT: 44 U/L (ref 0–44)
AST: 27 U/L (ref 15–41)
Albumin: 3 g/dL — ABNORMAL LOW (ref 3.5–5.0)
Alkaline Phosphatase: 229 U/L — ABNORMAL HIGH (ref 38–126)
Anion gap: 20 — ABNORMAL HIGH (ref 5–15)
BUN: 30 mg/dL — ABNORMAL HIGH (ref 6–20)
CO2: 14 mmol/L — ABNORMAL LOW (ref 22–32)
Calcium: 8.9 mg/dL (ref 8.9–10.3)
Chloride: 92 mmol/L — ABNORMAL LOW (ref 98–111)
Creatinine, Ser: 1.58 mg/dL — ABNORMAL HIGH (ref 0.61–1.24)
GFR calc Af Amer: >60 mL/min (ref >60)
GFR calc non Af Amer: 56 mL/min — ABNORMAL LOW (ref >60)
Glucose, Bld: 769 mg/dL (ref 70–99)
Potassium: 4.6 mmol/L (ref 3.5–5.1)
Sodium: 126 mmol/L — ABNORMAL LOW (ref 135–145)
Total Bilirubin: 1.5 mg/dL — ABNORMAL HIGH (ref 0.3–1.2)
Total Protein: 7.4 g/dL (ref 6.5–8.1)

## 2019-04-23 LAB — POCT I-STAT EG7
Acid-base deficit: 1 mmol/L (ref 0.0–2.0)
Bicarbonate: 23.6 mmol/L (ref 20.0–28.0)
Calcium, Ion: 1.23 mmol/L (ref 1.15–1.40)
HCT: 34 % — ABNORMAL LOW (ref 39.0–52.0)
Hemoglobin: 11.6 g/dL — ABNORMAL LOW (ref 13.0–17.0)
O2 Saturation: 95 %
Potassium: 4.1 mmol/L (ref 3.5–5.1)
Sodium: 135 mmol/L (ref 135–145)
TCO2: 25 mmol/L (ref 22–32)
pCO2, Ven: 39.8 mmHg — ABNORMAL LOW (ref 44.0–60.0)
pH, Ven: 7.38 (ref 7.250–7.430)
pO2, Ven: 80 mmHg — ABNORMAL HIGH (ref 32.0–45.0)

## 2019-04-23 LAB — CBC
HCT: 34.2 % — ABNORMAL LOW (ref 39.0–52.0)
Hemoglobin: 10.7 g/dL — ABNORMAL LOW (ref 13.0–17.0)
MCH: 30.7 pg (ref 26.0–34.0)
MCHC: 31.3 g/dL (ref 30.0–36.0)
MCV: 98.3 fL (ref 80.0–100.0)
Platelets: 400 10*3/uL (ref 150–400)
RBC: 3.48 MIL/uL — ABNORMAL LOW (ref 4.22–5.81)
RDW: 14.6 % (ref 11.5–15.5)
WBC: 9.5 10*3/uL (ref 4.0–10.5)
nRBC: 0 % (ref 0.0–0.2)

## 2019-04-23 LAB — LIPASE, BLOOD: Lipase: 38 U/L (ref 11–51)

## 2019-04-23 MED ORDER — ACETAMINOPHEN 325 MG PO TABS
650.0000 mg | ORAL_TABLET | Freq: Four times a day (QID) | ORAL | Status: DC | PRN
  Administered 2019-04-25: 06:00:00 650 mg via ORAL
  Filled 2019-04-23: qty 2

## 2019-04-23 MED ORDER — SODIUM CHLORIDE 0.9% FLUSH
3.0000 mL | Freq: Two times a day (BID) | INTRAVENOUS | Status: DC
  Administered 2019-04-24 – 2019-04-25 (×3): 3 mL via INTRAVENOUS

## 2019-04-23 MED ORDER — HEPARIN SODIUM (PORCINE) 5000 UNIT/ML IJ SOLN
5000.0000 [IU] | Freq: Three times a day (TID) | INTRAMUSCULAR | Status: DC
  Administered 2019-04-24 – 2019-04-25 (×3): 5000 [IU] via SUBCUTANEOUS
  Filled 2019-04-23 (×5): qty 1

## 2019-04-23 MED ORDER — LACTATED RINGERS IV BOLUS
1000.0000 mL | Freq: Once | INTRAVENOUS | Status: AC
  Administered 2019-04-23: 21:00:00 1000 mL via INTRAVENOUS

## 2019-04-23 MED ORDER — ACETAMINOPHEN 650 MG RE SUPP: 650.0000 mg | Freq: Four times a day (QID) | RECTAL | Status: DC | PRN

## 2019-04-23 MED ORDER — ONDANSETRON HCL 4 MG PO TABS: 4.0000 mg | ORAL_TABLET | Freq: Four times a day (QID) | ORAL | Status: DC | PRN

## 2019-04-23 MED ORDER — SODIUM CHLORIDE 0.9 % IV SOLN: INTRAVENOUS | Status: DC

## 2019-04-23 MED ORDER — ONDANSETRON HCL 4 MG/2ML IJ SOLN: 4.0000 mg | Freq: Four times a day (QID) | INTRAMUSCULAR | Status: DC | PRN

## 2019-04-23 MED ORDER — INSULIN ASPART 100 UNIT/ML ~~LOC~~ SOLN
0.0000 [IU] | SUBCUTANEOUS | Status: DC
  Administered 2019-04-24: 03:00:00 7 [IU] via SUBCUTANEOUS
  Administered 2019-04-24 (×4): 3 [IU] via SUBCUTANEOUS
  Administered 2019-04-25 (×2): 2 [IU] via SUBCUTANEOUS

## 2019-04-23 MED ORDER — INSULIN ASPART 100 UNIT/ML ~~LOC~~ SOLN: 7.0000 [IU] | Freq: Once | SUBCUTANEOUS | Status: DC

## 2019-04-23 NOTE — ED Triage Notes (Signed)
Pt arrives via EMS from home with reports of right big toe sutures possibly opened. Pt stubbed foot while outside and reported bleeding. En route pt reports lower abd pain and diarrhea.

## 2019-04-23 NOTE — ED Provider Notes (Signed)
Medical screening examination/treatment/procedure(s) were conducted as a shared visit with non-physician practitioner(s) and myself.  I personally evaluated the patient during the encounter.   Patient is a type 1 insulin-dependent diabetic.  He had a great toe amputation due to osteomyelitis at the end of last month.  He reports he stubbed his foot and started to bleed.  On arrival he is fairly drowsy and not providing extensive history.  I have examined the patient after fluids initiated and blood sugar improved.  His mental status is drowsy but he awakens to voice and is oriented to person place and time.  Heart is regular.  Lungs are clear.  Abdomen is soft without guarding.  He has a sutured wound at the amputation site on his left forefoot.  There is dried, fresh blood.  The wound is not dehisced.  No purulent drainage.  Moderate swelling.  Agree with plan of management.      Arby Barrette, MD 04/24/19 Garrett [name]

## 2019-04-23 NOTE — ED Provider Notes (Signed)
[facility] EMERGENCY DEPARTMENT Provider Note   CSN: [id number] Arrival date & time: 04/23/19  1557     History Chief Complaint  Patient presents with  . Abdominal Pain  . Foot Pain    Garrett [name] is a 34 y.o. male with history of insulin dependent Type 1 DM who presents with R foot bleeding. History is limited since pt is extremely drowsy. He states he stubbed his foot on a root outside and it started bleeding. He was concerned the stitches may have come loose. He is s/p right great toe amputation due to osteomyelitis and abscess by Dr. Lajoyce Corners on 2/24. He followed up in the office on 3/3 and was doing well. He also reported abdominal pain and diarrhea.   LEVEL 5 caveat  HPI     Past Medical History:  Diagnosis Date  . Compression fracture of C-spine (HCC)   . Type 1 diabetes [facility])     Patient Active Problem List   Diagnosis Date Noted  . Abscess of great toe, right   . Diabetic polyneuropathy associated with type 1 diabetes mellitus (HCC)   . Osteomyelitis of great toe of right foot (HCC)   . Cellulitis 04/07/2019  . Leukocytosis 04/07/2019  . Lactic acidosis 04/07/2019  . Left arm cellulitis 08/15/2018  . Diabetes mellitus with hyperglycemia (HCC) 08/15/2018  . DKA (diabetic ketoacidoses) (HCC) 03/27/2017    Past Surgical History:  Procedure Laterality Date  . AMPUTATION Right 04/10/2019   Procedure: AMPUTATION GREAT RIGHT TOE;  Surgeon: Nadara Mustard, MD;  Location: [facility] OR;  Service: Orthopedics;  Laterality: Right;  . NO PAST SURGERIES         No family history on file.  Social History   Tobacco Use  . Smoking status: Current Every Day Smoker    Packs/day: 0.50    Types: Cigarettes  . Smokeless tobacco: Never Used  Substance Use Topics  . Alcohol use: Yes    Comment: occasionally  . Drug use: Yes    Types: Marijuana    Home Medications Prior to Admission medications   Medication Sig Start Date End Date Taking? Authorizing  Provider  insulin NPH-regular Human (NOVOLIN 70/30) (70-30) 100 UNIT/ML injection Inject 20 Units into the skin See admin instructions. Inject 30 units into the skin before breakfast and 30 units at bedtime 04/11/19 05/11/19  Alessandra Bevels, MD  insulin regular (NOVOLIN R) 100 units/mL injection Inject 3-4 Units into the skin See admin instructions. Inject 3-4 units into the skin "as needed" two times a day (with breakfast and dinner) per sliding scale    [provider]  lisinopril (ZESTRIL) 5 MG tablet Take 1 tablet (5 mg total) by mouth daily. 04/11/19   Alessandra Bevels, MD  metoprolol tartrate (LOPRESSOR) 25 MG tablet Take 0.5 tablets (12.5 mg total) by mouth 2 (two) times daily. 04/11/19   Alessandra Bevels, MD  Multiple Vitamin (MULTIVITAMIN WITH MINERALS) TABS tablet Take 1 tablet by mouth daily. 04/12/19   Alessandra Bevels, MD  oxyCODONE-acetaminophen (PERCOCET/ROXICET) 5-325 MG tablet Take 1 tablet by mouth every 6 (six) hours as needed for severe pain. 04/17/19   Adonis Huguenin, NP    Allergies    Patient has no known allergies.  Review of Systems   Review of Systems  Unable to perform ROS: Other (pt is drowsy and possible intoxicated?)  Gastrointestinal: Positive for abdominal pain and diarrhea.  Skin: Positive for wound.    Physical Exam Updated Vital Signs BP 112/76  Pulse 86   Temp 97.8 F (36.6 C) (Oral)   Resp 16   Ht 5\' 9"  (1.753 m)   Wt 68 kg   SpO2 100%   BMI 22.15 kg/m   Physical Exam Vitals and nursing note reviewed.  Constitutional:      General: He is sleeping. He is not in acute distress.    Appearance: He is well-developed. He is not ill-appearing.     Comments: Difficult to arouse  HENT:     Head: Normocephalic and atraumatic.  Eyes:     General: No scleral icterus.       Right eye: No discharge.        Left eye: No discharge.     Conjunctiva/sclera: Conjunctivae normal.     Pupils: Pupils are equal, round, and reactive to light.    Cardiovascular:     Rate and Rhythm: Normal rate and regular rhythm.  Pulmonary:     Effort: Pulmonary effort is normal. No respiratory distress.     Breath sounds: Normal breath sounds.  Abdominal:     General: There is no distension.     Palpations: Abdomen is soft.     Tenderness: There is no abdominal tenderness.  Musculoskeletal:     Cervical back: Normal range of motion.     Comments: Right foot is s/p great toe amputation with dried blood. Stitches are intact. Foot is warm and perfused  Skin:    General: Skin is warm and dry.  Neurological:     Mental Status: He is oriented to person, place, and time. He is lethargic.  Psychiatric:        Behavior: Behavior normal.     ED Results / Procedures / Treatments   Labs (all labs ordered are listed, but only abnormal results are displayed) Labs Reviewed  COMPREHENSIVE METABOLIC PANEL - Abnormal; Notable for the following components:      Result Value   Sodium 126 (*)    Chloride 92 (*)    CO2 14 (*)    Glucose, Bld 769 (*)    BUN 30 (*)    Creatinine, Ser 1.58 (*)    Albumin 3.0 (*)    Alkaline Phosphatase 229 (*)    Total Bilirubin 1.5 (*)    GFR calc non Af Amer 56 (*)    Anion gap 20 (*)    All other components within normal limits  CBC - Abnormal; Notable for the following components:   RBC 3.48 (*)    Hemoglobin 10.7 (*)    HCT 34.2 (*)    All other components within normal limits  CBG MONITORING, ED - Abnormal; Notable for the following components:   Glucose-Capillary 585 (*)    All other components within normal limits  CBG MONITORING, ED - Abnormal; Notable for the following components:   Glucose-Capillary 181 (*)    All other components within normal limits  CBG MONITORING, ED - Abnormal; Notable for the following components:   Glucose-Capillary 177 (*)    All other components within normal limits  POCT I-STAT EG7 - Abnormal; Notable for the following components:   pCO2, Ven 39.8 (*)    pO2, Ven 80.0  (*)    HCT 34.0 (*)    Hemoglobin 11.6 (*)    All other components within normal limits  SARS CORONAVIRUS 2 (TAT 6-24 HRS)  LIPASE, BLOOD  URINALYSIS, ROUTINE W REFLEX MICROSCOPIC  RAPID URINE DRUG SCREEN, HOSP PERFORMED  ETHANOL  BETA-HYDROXYBUTYRIC ACID  BASIC METABOLIC  PANEL  I-STAT VENOUS BLOOD GAS, ED    EKG None  Radiology No results found.  Procedures Procedures (including critical care time)  Medications Ordered in ED Medications  insulin aspart (novoLOG) injection 7 Units (0 Units Intravenous Hold 04/23/19 2034)  lactated ringers bolus 1,000 mL (0 mLs Intravenous Stopped 04/23/19 2129)    ED Course  I have reviewed the triage vital signs and the nursing notes.  Pertinent labs & imaging results that were available during my care of the patient were reviewed by me and considered in my medical decision making (see chart for details).  34 year old male presents for a wound check after stubbing his foot on a root.  History is extremely limited since patient is lethargic and difficult to arouse.  His vital signs are normal.  Wound was unwrapped and he has a lot of dry blood on the area but stitches are intact and there is no active bleeding.  Heart is regular rate and rhythm.  Lungs are clear to auscultation.  Abdomen is soft and nontender.  Lab work obtained shows significant hyperglycemia (769), low bicarb (14), elevated anion gap (20), and an AKI (BUN 30, SCr 1.5). CBC shows anemia which is stable. Fluids and insulin ordered.  Notified by nursing that CBG is 181 and repeat is 177 prior to giving insulin. Pt was able to tell he that he took 9 units of his insulin today but could not say when. Shared visit with Dr. Broadus John. Due to lethargy, possible DKA will admit for further management. Discussed with Dr. Allena Katz with [facility] who will admit. He is requesting repeat BMP.  DM Rules/Calculators/A&P                       Final Clinical Impression(s) / ED Diagnoses Final  diagnoses:  Diabetic ketoacidosis without coma associated with type 1 diabetes mellitus (HCC)  Bleeding from wound    Rx / DC Orders ED Discharge Orders    None       Bethel Born, PA-C 04/23/19 2218    Arby Barrette, MD 04/24/19 [phone]

## 2019-04-23 NOTE — H&P (Signed)
History and Physical    Garrett [name] UYE:[id number] DOB: [birth year] DOA: 04/23/2019  PCP: Garrett [name], No Pcp Per  Garrett [name] coming from: Home  I have personally briefly reviewed Garrett [name]'s old medical records in [place]  Chief Complaint: Hyperglycemia  HPI: Garrett [name] is a 34 y.o. male with medical history significant for type 1 diabetes, hypertension, and recent admission for right great toe osteomyelitis s/p amputation on 05/08/2019 who presents to the ED for evaluation of hyperglycemia.  History limited from Garrett [name] due to excessive somnolence and is therefore supplemented from EDP and chart review.  Garrett [name] reportedly presented to the ED with right surgical site bleeding after stubbing his toe.  He reported abdominal pain and diarrhea.  Per ED documentation Garrett [name] reportedly said he took 9 units of insulin earlier today.  Garrett [name] is otherwise too somnolent to provide any further history.  ED Course:  Initial vitals showed BP 108/77, pulse 104, RR 16, temp 97.8 Fahrenheit, SPO2 100% on room air.  Labs show serum glucose 769, sodium 126 (142 when corrected for hyperglycemia), potassium 4.6, bicarb 14, anion gap 20, BUN 30, creatinine 1.58, AST 27, ALT 44, alk phos 229, total bilirubin 1.5, WBC 9.5, hemoglobin 10.7, platelets 400,000.  VBG showed pH 7.38, PCO2 39.8, PO2 80.  UDS, urinalysis, beta hydroxybutyrate were ordered and pending.  Repeat BMP was ordered and pending.  SARS-CoV-2 PCR test is collected and pending.  Right foot x-ray shows right first toe amputation with no adverse features identified, calcified peripheral vascular disease noted.  Garrett [name] was given 1 L LR.  Hospitalist service was consulted admit for further evaluation management.  Review of Systems:  Unable to obtain full review of systems due to excessive somnolence.   Past Medical History:  Diagnosis Date  . Compression fracture of C-spine ([place])   . Type 1 diabetes [facility])     Past Surgical History:    Procedure Laterality Date  . AMPUTATION Right 04/10/2019   Procedure: AMPUTATION GREAT RIGHT TOE;  Surgeon: Newt Minion, MD;  Location: [place];  Service: Orthopedics;  Laterality: Right;  . NO PAST SURGERIES      Social History:  reports that he has been smoking cigarettes. He has been smoking about 0.50 packs per day. He has never used smokeless tobacco. He reports current alcohol use. He reports current drug use. Drug: Marijuana.  No Known Allergies  Family History  Family history unknown: Yes     Prior to Admission medications   Medication Sig Start Date End Date Taking? Authorizing Provider  insulin NPH-regular Human (NOVOLIN 70/30) (70-30) 100 UNIT/ML injection Inject 20 Units into the skin See admin instructions. Inject 30 units into the skin before breakfast and 30 units at bedtime 04/11/19 05/11/19  Guilford Shi, MD  insulin regular (NOVOLIN R) 100 units/mL injection Inject 3-4 Units into the skin See admin instructions. Inject 3-4 units into the skin "as needed" two times a day (with breakfast and dinner) per sliding scale    [provider]  lisinopril (ZESTRIL) 5 MG tablet Take 1 tablet (5 mg total) by mouth daily. 04/11/19   Guilford Shi, MD  metoprolol tartrate (LOPRESSOR) 25 MG tablet Take 0.5 tablets (12.5 mg total) by mouth 2 (two) times daily. 04/11/19   Guilford Shi, MD  Multiple Vitamin (MULTIVITAMIN WITH MINERALS) TABS tablet Take 1 tablet by mouth daily. 04/12/19   Guilford Shi, MD  oxyCODONE-acetaminophen (PERCOCET/ROXICET) 5-325 MG tablet Take 1 tablet by mouth every 6 (six) hours as needed for severe  pain. 04/17/19   Suzan Slick, NP    Physical Exam: Vitals:   04/23/19 2045 04/23/19 2100 04/23/19 2115 04/23/19 2145  BP: 120/76 (!) 144/92 129/85 117/75  Pulse: 86 (!) 105  96  Resp:  18  18  Temp:      TempSrc:      SpO2: 100% 100%  100%  Weight:      Height:      Exam limited due to excessive somnolence. Constitutional: Resting  supine in bed, somnolent, will intermittently open eyes and turn head. Eyes: PERRL, lids and conjunctivae normal ENMT: Mucous membranes are moist. Posterior pharynx clear of any exudate or lesions. Neck: normal, supple, no masses. Respiratory: clear to auscultation anteriorly.  Normal respiratory effort. No accessory muscle use.  Protecting airway. Cardiovascular: Regular rate and rhythm, no murmurs / rubs / gallops. No extremity edema. 2+ pedal pulses. Abdomen: no tenderness, no masses palpated. No hepatosplenomegaly. Bowel sounds positive.  Musculoskeletal: S/p right first toe amputation with sutures in place and surrounding dried blood.  Moving extremities spontaneously. Skin: S/p right first toe amputation with sutures in place and overlying dried blood. Neurologic: Somnolent, transiently awakens and turns head.  Moving all extremities. Psychiatric: Somnolent, not communicating.   Labs on Admission: I have personally reviewed following labs and imaging studies  CBC: Recent Labs  Lab 04/23/19 1626 04/23/19 2048  WBC 9.5  --   HGB 10.7* 11.6*  HCT 34.2* 34.0*  MCV 98.3  --   PLT 400  --    Basic Metabolic Panel: Recent Labs  Lab 04/23/19 1626 04/23/19 2048  NA 126* 135  K 4.6 4.1  CL 92*  --   CO2 14*  --   GLUCOSE 769*  --   BUN 30*  --   CREATININE 1.58*  --   CALCIUM 8.9  --    GFR: Estimated Creatinine Clearance: 62.8 mL/min (A) (by C-G formula based on SCr of 1.58 mg/dL (H)). Liver Function Tests: Recent Labs  Lab 04/23/19 1626  AST 27  ALT 44  ALKPHOS 229*  BILITOT 1.5*  PROT 7.4  ALBUMIN 3.0*   Recent Labs  Lab 04/23/19 1626  LIPASE 38   No results for input(s): AMMONIA in the last 168 hours. Coagulation Profile: No results for input(s): INR, PROTIME in the last 168 hours. Cardiac Enzymes: No results for input(s): CKTOTAL, CKMB, CKMBINDEX, TROPONINI in the last 168 hours. BNP (last 3 results) No results for input(s): PROBNP in the last 8760  hours. HbA1C: No results for input(s): HGBA1C in the last 72 hours. CBG: Recent Labs  Lab 04/23/19 1710 04/23/19 2036 04/23/19 2038  GLUCAP 585* 181* 177*   Lipid Profile: No results for input(s): CHOL, HDL, LDLCALC, TRIG, CHOLHDL, LDLDIRECT in the last 72 hours. Thyroid Function Tests: No results for input(s): TSH, T4TOTAL, FREET4, T3FREE, THYROIDAB in the last 72 hours. Anemia Panel: No results for input(s): VITAMINB12, FOLATE, FERRITIN, TIBC, IRON, RETICCTPCT in the last 72 hours. Urine analysis:    Component Value Date/Time   COLORURINE STRAW (A) 04/07/2019 1405   APPEARANCEUR CLEAR 04/07/2019 1405   LABSPEC 1.029 04/07/2019 1405   PHURINE 6.0 04/07/2019 1405   GLUCOSEU >=500 (A) 04/07/2019 1405   HGBUR NEGATIVE 04/07/2019 1405   BILIRUBINUR NEGATIVE 04/07/2019 1405   KETONESUR NEGATIVE 04/07/2019 1405   PROTEINUR NEGATIVE 04/07/2019 1405   NITRITE NEGATIVE 04/07/2019 1405   LEUKOCYTESUR NEGATIVE 04/07/2019 1405    Radiological Exams on Admission: DG Foot Complete Right  Result Date:  04/23/2019 CLINICAL DATA:  34 year old male with right foot pain. EXAM: RIGHT FOOT COMPLETE - 3+ VIEW COMPARISON:  Right foot series 04/07/2019. FINDINGS: Amputation of the right 1st phalanges since last month. Mild sclerosis of the distal 1st metatarsal. But no fracture or osteolysis identified. Calcified peripheral vascular disease. Other right foot osseous structures appear stable and intact. No adverse soft tissue features identified. IMPRESSION: 1. Right 1st toe amputation since last month with no adverse features identified. 2. Calcified peripheral vascular disease. Electronically Signed   By: Genevie Ann M.D.   On: 04/23/2019 22:22    EKG: Not performed.  Assessment/Plan Principal Problem:   Hyperglycemia due to type 1 diabetes mellitus ([place]) Active Problems:   AKI (acute kidney injury) ([place])   Hypertension associated with diabetes ([place])  Samin Milke is a 34 y.o. male with medical  history significant for type 1 diabetes, hypertension, and recent admission for right great toe osteomyelitis s/p amputation on 05/08/2019 who is admitted with encephalopathy.  Encephalopathy/excessive somnolence: Initially felt due to hyperglycemia however remains excessively somnolent with correction of serum glucose.  May be from pain medication use. -Start IV fluid hydration overnight, check UDS -Check urinalysis  Hyperglycemia due to type 1 diabetes: Initial serum glucose of 769, bicarb 14, anion gap 20.  Beta hydroxybutyrate and urinalysis pending.  A1c 14.0 on 04/07/2019.  He received 1 L LR and follow-up CBGs were 177 and 181 without insulin administration.  Repeat BMP shows improvement of hyperglycemia with glucose 140 and resolution of high anion gap metabolic acidosis.  Per report Garrett [name] took 9 units of insulin sometime earlier today. -Hold off on insulin infusion -Keep on sensitive SSI q4h with hypoglycemia protocol while NPO as he is too somnolent to safely eat  Acute kidney injury: Likely prerenal in setting of hyperglycemia/DKA.  Continue IV fluid resuscitation.  Creatinine improved as above.  Hypertension: Currently normotensive.  Holding metoprolol and lisinopril.  Osteomyelitis of right great toe s/p amputation on 04/10/2019: X-ray without evidence of adverse features.  Sutures in place with surrounding dried blood.  No open wound, active discharge, or oozing blood.  DVT prophylaxis: Subcutaneous heparin Code Status: Full code based on prior Family Communication: Attempted to call Garrett [name]'s contact/friend as listed on demographics, Phylis Bougie [phone] without answer Disposition Plan: From home, likely discharge to home in 1-2 days. Consults called: None Admission status: Observation   Zada Finders MD [facility]  If 7PM-7AM, please contact night-coverage [email or web address]  04/23/2019, 10:36 PM

## 2019-04-23 NOTE — ED Notes (Signed)
Pt transported to xray 

## 2019-04-23 NOTE — ED Notes (Signed)
Pt CBg checked prior to giving 7units insulin IV, 181mg /dL. Rechecked in different finger: 177mg /dL.  EDP notified. Pt endorses taking 9 units insulin at some point prior to presenting to ED but is unable to provide details.

## 2019-04-24 DIAGNOSIS — E1051 Type 1 diabetes mellitus with diabetic peripheral angiopathy without gangrene: Secondary | ICD-10-CM | POA: Diagnosis present

## 2019-04-24 DIAGNOSIS — Z89411 Acquired absence of right great toe: Secondary | ICD-10-CM | POA: Diagnosis not present

## 2019-04-24 DIAGNOSIS — Z79891 Long term (current) use of opiate analgesic: Secondary | ICD-10-CM | POA: Diagnosis not present

## 2019-04-24 DIAGNOSIS — F1290 Cannabis use, unspecified, uncomplicated: Secondary | ICD-10-CM | POA: Diagnosis present

## 2019-04-24 DIAGNOSIS — G9340 Encephalopathy, unspecified: Secondary | ICD-10-CM

## 2019-04-24 DIAGNOSIS — E1010 Type 1 diabetes mellitus with ketoacidosis without coma: Principal | ICD-10-CM

## 2019-04-24 DIAGNOSIS — G9341 Metabolic encephalopathy: Secondary | ICD-10-CM | POA: Diagnosis present

## 2019-04-24 DIAGNOSIS — E1065 Type 1 diabetes mellitus with hyperglycemia: Secondary | ICD-10-CM | POA: Diagnosis not present

## 2019-04-24 DIAGNOSIS — Z9119 Patient's noncompliance with other medical treatment and regimen: Secondary | ICD-10-CM | POA: Diagnosis not present

## 2019-04-24 DIAGNOSIS — Z20822 Contact with and (suspected) exposure to covid-19: Secondary | ICD-10-CM | POA: Diagnosis present

## 2019-04-24 DIAGNOSIS — Z794 Long term (current) use of insulin: Secondary | ICD-10-CM | POA: Diagnosis not present

## 2019-04-24 DIAGNOSIS — N179 Acute kidney failure, unspecified: Secondary | ICD-10-CM | POA: Diagnosis present

## 2019-04-24 DIAGNOSIS — R748 Abnormal levels of other serum enzymes: Secondary | ICD-10-CM | POA: Diagnosis present

## 2019-04-24 DIAGNOSIS — Z7289 Other problems related to lifestyle: Secondary | ICD-10-CM | POA: Diagnosis not present

## 2019-04-24 DIAGNOSIS — I152 Hypertension secondary to endocrine disorders: Secondary | ICD-10-CM | POA: Diagnosis present

## 2019-04-24 DIAGNOSIS — D649 Anemia, unspecified: Secondary | ICD-10-CM | POA: Diagnosis present

## 2019-04-24 DIAGNOSIS — E1042 Type 1 diabetes mellitus with diabetic polyneuropathy: Secondary | ICD-10-CM | POA: Diagnosis present

## 2019-04-24 DIAGNOSIS — F17210 Nicotine dependence, cigarettes, uncomplicated: Secondary | ICD-10-CM | POA: Diagnosis present

## 2019-04-24 DIAGNOSIS — R739 Hyperglycemia, unspecified: Secondary | ICD-10-CM | POA: Insufficient documentation

## 2019-04-24 DIAGNOSIS — Z79899 Other long term (current) drug therapy: Secondary | ICD-10-CM | POA: Diagnosis not present

## 2019-04-24 LAB — CBG MONITORING, ED
Glucose-Capillary: 342 mg/dL — ABNORMAL HIGH (ref 70–99)
Glucose-Capillary: 196 mg/dL — ABNORMAL HIGH (ref 70–99)
Glucose-Capillary: 217 mg/dL — ABNORMAL HIGH (ref 70–99)

## 2019-04-24 LAB — RAPID URINE DRUG SCREEN, HOSP PERFORMED
Amphetamines: NOT DETECTED
Barbiturates: NOT DETECTED
Benzodiazepines: NOT DETECTED
Cocaine: NOT DETECTED
Opiates: NOT DETECTED
Tetrahydrocannabinol: POSITIVE — AB

## 2019-04-24 LAB — URINALYSIS, ROUTINE W REFLEX MICROSCOPIC
Bacteria, UA: NONE SEEN
Bilirubin Urine: NEGATIVE
Glucose, UA: >=500 mg/dL — AB
Hgb urine dipstick: NEGATIVE
Ketones, ur: 20 mg/dL — AB
Leukocytes,Ua: NEGATIVE
Nitrite: NEGATIVE
Protein, ur: 30 mg/dL — AB
Specific Gravity, Urine: 1.02 (ref 1.005–1.030)
pH: 5 (ref 5.0–8.0)

## 2019-04-24 LAB — GLUCOSE, CAPILLARY
Glucose-Capillary: 250 mg/dL — ABNORMAL HIGH (ref 70–99)
Glucose-Capillary: 241 mg/dL — ABNORMAL HIGH (ref 70–99)
Glucose-Capillary: 246 mg/dL — ABNORMAL HIGH (ref 70–99)

## 2019-04-24 LAB — BASIC METABOLIC PANEL
Anion gap: 12 (ref 5–15)
BUN: 21 mg/dL — ABNORMAL HIGH (ref 6–20)
CO2: 21 mmol/L — ABNORMAL LOW (ref 22–32)
Calcium: 8.4 mg/dL — ABNORMAL LOW (ref 8.9–10.3)
Chloride: 101 mmol/L (ref 98–111)
Creatinine, Ser: 1.07 mg/dL (ref 0.61–1.24)
GFR calc Af Amer: >60 mL/min (ref >60)
GFR calc non Af Amer: >60 mL/min (ref >60)
Glucose, Bld: 260 mg/dL — ABNORMAL HIGH (ref 70–99)
Potassium: 3.8 mmol/L (ref 3.5–5.1)
Sodium: 134 mmol/L — ABNORMAL LOW (ref 135–145)

## 2019-04-24 LAB — ETHANOL: Alcohol, Ethyl (B): <10 mg/dL (ref <10)

## 2019-04-24 LAB — BETA-HYDROXYBUTYRIC ACID: Beta-Hydroxybutyric Acid: 1.46 mmol/L — ABNORMAL HIGH (ref 0.05–0.27)

## 2019-04-24 LAB — SARS CORONAVIRUS 2 (TAT 6-24 HRS): SARS Coronavirus 2: NEGATIVE

## 2019-04-24 MED ORDER — SODIUM CHLORIDE 0.9 % IV BOLUS
500.0000 mL | Freq: Once | INTRAVENOUS | Status: AC
  Administered 2019-04-24: 500 mL via INTRAVENOUS

## 2019-04-24 MED ORDER — WITCH HAZEL-GLYCERIN EX PADS
1.0000 "application " | MEDICATED_PAD | CUTANEOUS | Status: DC | PRN
  Filled 2019-04-24: qty 100

## 2019-04-24 MED ORDER — SODIUM CHLORIDE 0.9 % IV SOLN: INTRAVENOUS | Status: AC

## 2019-04-24 MED ORDER — INSULIN GLARGINE 100 UNIT/ML ~~LOC~~ SOLN
10.0000 [IU] | Freq: Every day | SUBCUTANEOUS | Status: DC
  Administered 2019-04-24: 10 [IU] via SUBCUTANEOUS
  Filled 2019-04-24 (×3): qty 0.1

## 2019-04-24 MED ORDER — METOPROLOL TARTRATE 12.5 MG HALF TABLET
12.5000 mg | ORAL_TABLET | Freq: Two times a day (BID) | ORAL | Status: DC
  Administered 2019-04-24 – 2019-04-25 (×3): 12.5 mg via ORAL
  Filled 2019-04-24 (×3): qty 1

## 2019-04-24 NOTE — ED Notes (Signed)
Pt resting in bed. Pt denies new or worsening complaints. Will continue to monitor. No distress noted. Pt on continuous monitoring via blood pressure, pulse ox, and cardiac monitor.  

## 2019-04-24 NOTE — ED Notes (Signed)
Medications given and charted per MAR. Tolerated well. No distress noted. 

## 2019-04-24 NOTE — ED Notes (Signed)
Pt is in bathroom, will return for labs

## 2019-04-24 NOTE — ED Notes (Signed)
Dr. Lajoyce Corners to see pt

## 2019-04-24 NOTE — Progress Notes (Signed)
Progress Note    Garrett [name]  UJW:[id number] DOB: 1985-08-29  DOA: 04/23/2019 PCP: Patient, No Pcp Per    Brief Narrative:   Chief complaint: hyperglycemia  Medical records reviewed and are as summarized below:  Garrett [name] is an 34 y.o. male significant for type 1 diabetes, hypertension, osteomyelitis status post amputation of the right great toe presented to the emergency department March 9 chief complaint hyperglycemia.  Patient was somnolent with a serum glucose greater than 700. Patient reported compliance but also states he has no doctor.   Assessment/Plan:   Principal Problem:   Encephalopathy Active Problems:   Hyperglycemia due to type 1 diabetes mellitus ([place])   Hypertension associated with diabetes ([place])   Osteomyelitis of great toe of right foot ([place])   AKI (acute kidney injury) ([place])   #1.  Hyperglycemia secondary to type 1 diabetes and likely noncompliance.  Serum glucose greater than 700 on admission.  Reports he taking 9 units of insulin prior to coming to the hospital.  Work-up revealed potassium of 4.6 bicarb 14 anion gap 20, hemoglobin A1c 14. Beta hydroxybutyric acid 1.46.  Received vigorous IV fluids with follow-up CBGs in the emergency department that revealed readings of 177 and 181.  -We will start low-dose Lantus -Continue sliding scale -Diabetes coordinator consult -Condition of care to assist with establishing with PCP -Carb modified diet -Monitor  #2.  Acute encephalopathy. Improved this am.  Initially thought to be related to #1 but he remained drowsy in spite of serum glucose correction.  Question overuse of pain medication.  Urine drug screen positive for marijuana otherwise negative.  No sign symptoms of infection.  No unexplained metabolic derangement.  Quite arousable and alert on my exam. -monitor  #3.  Acute kidney injury.  Likely related to #1. -Continue IV fluids -Hold nephrotoxins -Monitor urine output -Recheck in the  morning  #4.  Hypertensive, home medications include metoprolol and lisinopril.  Pressure stable.  Somewhat tachycardic. -We will resume home beta-blocker -500 cc bolus of normal saline -Monitor  #5.  Osteomyelitis of great toe on right foot status post amputation.  No signs of infection. -Monitor  #6. Elevated alk phos. ast and alt within limits of normal. Total bili 1.5. likely reactive -iv fluids -recheck   Family Communication/Anticipated D/C date and plan/Code Status   DVT prophylaxis: Lovenox ordered. Code Status: Full Code.  Family Communication: patient Disposition Plan: home hopefully tomorrow   Medical Consultants:    None.   Anti-Infectives:    None  Subjective:   Awake alert no acute distress.  Complains of being really hungry and requesting food  Objective:    Vitals:   04/24/19 0800 04/24/19 0815 04/24/19 1038 04/24/19 1217  BP: 120/61  (!) 130/91 118/77  Pulse:  99 98 (!) 109  Resp: '19 13 16 18  '$ Temp:   (!) 97.5 F (36.4 C) 98.2 F (36.8 C)  TempSrc:   Oral Oral  SpO2:  97% 100% 100%  Weight:      Height:        Intake/Output Summary (Last 24 hours) at 04/24/2019 1308 Last data filed at 04/23/2019 2129 Gross per 24 hour  Intake 1000 ml  Output --  Net 1000 ml   Filed Weights   04/23/19 1617  Weight: 68 kg    Exam: General: Awake alert no acute distress CV: Tachycardic but regular no murmur gallop or rub no lower extremity edema Respiratory: No increased work of breathing respirations slightly shallow but  good air movement I hear no crackles no wheezes Abdomen: Nondistended soft positive bowel sounds but sluggish nontender to palpation no guarding or rebounding Musculoskeletal joints without swelling/erythema right foot with absent great toe amputation site healing no erythema swelling or drainage Neuro: Awake alert oriented x3 speech clear facial symmetry cranial nerves II through XII grossly intact  Data Reviewed:   I have  personally reviewed following labs and imaging studies:  Labs: Labs show the following:   Basic Metabolic Panel: Recent Labs  Lab 04/23/19 1626 04/23/19 1626 04/23/19 2048 04/23/19 2048 04/23/19 2145 04/24/19 0445  NA 126*  --  135  --  134* 134*  K 4.6   < > 4.1   < > 3.9 3.8  CL 92*  --   --   --  102 101  CO2 14*  --   --   --  22 21*  GLUCOSE 769*  --   --   --  140* 260*  BUN 30*  --   --   --  24* 21*  CREATININE 1.58*  --   --   --  0.85 1.07  CALCIUM 8.9  --   --   --  8.7* 8.4*   < > = values in this interval not displayed.   GFR Estimated Creatinine Clearance: 92.7 mL/min (by C-G formula based on SCr of 1.07 mg/dL). Liver Function Tests: Recent Labs  Lab 04/23/19 1626  AST 27  ALT 44  ALKPHOS 229*  BILITOT 1.5*  PROT 7.4  ALBUMIN 3.0*   Recent Labs  Lab 04/23/19 1626  LIPASE 38   No results for input(s): AMMONIA in the last 168 hours. Coagulation profile No results for input(s): INR, PROTIME in the last 168 hours.  CBC: Recent Labs  Lab 04/23/19 1626 04/23/19 2048  WBC 9.5  --   HGB 10.7* 11.6*  HCT 34.2* 34.0*  MCV 98.3  --   PLT 400  --    Cardiac Enzymes: No results for input(s): CKTOTAL, CKMB, CKMBINDEX, TROPONINI in the last 168 hours. BNP (last 3 results) No results for input(s): PROBNP in the last 8760 hours. CBG: Recent Labs  Lab 04/23/19 2038 04/24/19 0307 04/24/19 0530 04/24/19 0839 04/24/19 1134  GLUCAP 177* 342* 196* 217* 250*   D-Dimer: No results for input(s): DDIMER in the last 72 hours. Hgb A1c: No results for input(s): HGBA1C in the last 72 hours. Lipid Profile: No results for input(s): CHOL, HDL, LDLCALC, TRIG, CHOLHDL, LDLDIRECT in the last 72 hours. Thyroid function studies: No results for input(s): TSH, T4TOTAL, T3FREE, THYROIDAB in the last 72 hours.  Invalid input(s): FREET3 Anemia work up: No results for input(s): VITAMINB12, FOLATE, FERRITIN, TIBC, IRON, RETICCTPCT in the last 72 hours. Sepsis  Labs: Recent Labs  Lab 04/23/19 1626  WBC 9.5    Microbiology Recent Results (from the past 240 hour(s))  SARS CORONAVIRUS 2 (TAT 6-24 HRS) Nasopharyngeal Nasopharyngeal Swab     Status: None   Collection Time: 04/23/19  9:30 PM   Specimen: Nasopharyngeal Swab  Result Value Ref Range Status   SARS Coronavirus 2 NEGATIVE NEGATIVE Final    Comment: (NOTE) SARS-CoV-2 target nucleic acids are NOT DETECTED. The SARS-CoV-2 RNA is generally detectable in upper and lower respiratory specimens during the acute phase of infection. Negative results do not preclude SARS-CoV-2 infection, do not rule out co-infections with other pathogens, and should not be used as the sole basis for treatment or other patient management decisions. Negative results must  be combined with clinical observations, patient history, and epidemiological information. The expected result is Negative. Fact Sheet for Patients: SugarRoll.be Fact Sheet for Healthcare Providers: https://www.woods-mathews.com/ This test is not yet approved or cleared by the [place] FDA and  has been authorized for detection and/or diagnosis of SARS-CoV-2 by FDA under an Emergency Use Authorization (EUA). This EUA will remain  in effect (meaning this test can be used) for the duration of the COVID-19 declaration under Section 56 4(b)(1) of the Act, 21 U.S.C. section 360bbb-3(b)(1), unless the authorization is terminated or revoked sooner. Performed at [facility], [place] [address], [place], [place] [address]     Procedures and diagnostic studies:  DG Foot Complete Right  Result Date: 04/23/2019 CLINICAL DATA:  34 year old male with right foot pain. EXAM: RIGHT FOOT COMPLETE - 3+ VIEW COMPARISON:  Right foot series 04/07/2019. FINDINGS: Amputation of the right 1st phalanges since last month. Mild sclerosis of the distal 1st metatarsal. But no fracture or osteolysis identified. Calcified  peripheral vascular disease. Other right foot osseous structures appear stable and intact. No adverse soft tissue features identified. IMPRESSION: 1. Right 1st toe amputation since last month with no adverse features identified. 2. Calcified peripheral vascular disease. Electronically Signed   By: Genevie Ann M.D.   On: 04/23/2019 22:22    Medications:   . heparin  5,000 Units Subcutaneous Q8H  . insulin aspart  0-9 Units Subcutaneous Q4H  . insulin glargine  10 Units Subcutaneous Daily  . sodium chloride flush  3 mL Intravenous Q12H   Continuous Infusions: . sodium chloride 100 mL/hr at 04/24/19 0755  . sodium chloride       LOS: 0 days   Radene Gunning NP  [facility]   How to contact the [facility] Attending or Consulting provider [place] or covering provider during after hours [place], for this patient?  1. Check the care team in [facility] and look for a) attending/consulting TR[facility] provider listed and b) the [facility] team listed 2. Log into [email or web address] and use [place] universal password to access. If you do not have the password, please contact the hospital operator. 3. Locate the [facility] provider you are looking for under [facility] and page to a number that you can be directly reached. 4. If you still have difficulty reaching the provider, please page the [facility] (Director on Call) for the Hospitalists listed on amion for assistance.  04/24/2019, 1:08 PM

## 2019-04-25 ENCOUNTER — Telehealth: Payer: Self-pay | Admitting: General Practice

## 2019-04-25 LAB — COMPREHENSIVE METABOLIC PANEL
ALT: 35 U/L (ref 0–44)
AST: 36 U/L (ref 15–41)
Albumin: 2.5 g/dL — ABNORMAL LOW (ref 3.5–5.0)
Alkaline Phosphatase: 154 U/L — ABNORMAL HIGH (ref 38–126)
Anion gap: 8 (ref 5–15)
BUN: 12 mg/dL (ref 6–20)
CO2: 23 mmol/L (ref 22–32)
Calcium: 8.3 mg/dL — ABNORMAL LOW (ref 8.9–10.3)
Chloride: 104 mmol/L (ref 98–111)
Creatinine, Ser: 0.74 mg/dL (ref 0.61–1.24)
GFR calc Af Amer: >60 mL/min (ref >60)
GFR calc non Af Amer: >60 mL/min (ref >60)
Glucose, Bld: 85 mg/dL (ref 70–99)
Potassium: 3.6 mmol/L (ref 3.5–5.1)
Sodium: 135 mmol/L (ref 135–145)
Total Bilirubin: 0.2 mg/dL — ABNORMAL LOW (ref 0.3–1.2)
Total Protein: 6.3 g/dL — ABNORMAL LOW (ref 6.5–8.1)

## 2019-04-25 LAB — GLUCOSE, CAPILLARY
Glucose-Capillary: 152 mg/dL — ABNORMAL HIGH (ref 70–99)
Glucose-Capillary: 160 mg/dL — ABNORMAL HIGH (ref 70–99)
Glucose-Capillary: 182 mg/dL — ABNORMAL HIGH (ref 70–99)
Glucose-Capillary: 81 mg/dL (ref 70–99)

## 2019-04-25 MED ORDER — INSULIN ASPART 100 UNIT/ML ~~LOC~~ SOLN
0.0000 [IU] | Freq: Three times a day (TID) | SUBCUTANEOUS | Status: DC
  Administered 2019-04-25: 09:00:00 2 [IU] via SUBCUTANEOUS

## 2019-04-25 MED ORDER — INSULIN ASPART PROT & ASPART (70-30 MIX) 100 UNIT/ML ~~LOC~~ SUSP
15.0000 [IU] | Freq: Two times a day (BID) | SUBCUTANEOUS | Status: DC
  Administered 2019-04-25: 15 [IU] via SUBCUTANEOUS
  Filled 2019-04-25: qty 10

## 2019-04-25 MED ORDER — INSULIN ASPART 100 UNIT/ML ~~LOC~~ SOLN: 0.0000 [IU] | Freq: Every day | SUBCUTANEOUS | Status: DC

## 2019-04-25 NOTE — Progress Notes (Signed)
Pt given discharge summary and discharged via friend as transportation. Vital signs WDL

## 2019-04-25 NOTE — TOC Transition Note (Signed)
Transition of Care [facility]) - CM/SW Discharge Note   Patient Details  Name: Garrett [name] MRN: [id number] Date of Birth: 1985/02/25  [facility]) CM/SW Contact:  Garrett Plan, RN Phone Number: 04/25/2019, 12:34 PM   Clinical Narrative:     Spoke to patient . Patient does have Medicaid of  . Therefore not eligible for [facility] program.   Patient was given [facility] information on last visit. He has called and left message but has not received a call back.    Patient gave NCM permission to email Alesia Richards at [facility] and request they call him. Same done.   Also, encouraged patient to apply for [facility] Medicaid    Final next level of care: Home/Self Care Barriers to Discharge: No Barriers Identified   Patient Goals and CMS Choice Patient states their goals for this hospitalization and ongoing recovery are:: to go home CMS Medicare.gov Compare Post Acute Care list provided to:: Patient Choice offered to / list presented to : NA  Discharge Placement                       Discharge [name] and Services     Post Acute Care Choice: NA            DME Agency: NA       HH Arranged: NA          Social Determinants of Health (SDOH) Interventions     Readmission Risk Interventions No flowsheet data found.

## 2019-04-25 NOTE — Progress Notes (Signed)
Inpatient Diabetes Program Recommendations  AACE/ADA: New Consensus Statement on Inpatient Glycemic Control (2015)  Target Ranges:  Prepandial:   less than 140 mg/dL      Peak postprandial:   less than 180 mg/dL (1-2 hours)      Critically ill patients:  140 - 180 mg/dL   Lab Results  Component Value Date   GLUCAP 246 (H) 04/24/2019   HGBA1C 14.0 (H) 04/07/2019    Review of Glycemic Control  Diabetes history: DM1 Outpatient Diabetes medications: Novolin 70/30 20 units bid, Novolin R 3-4 units tidwc Current orders for Inpatient glycemic control: Lantus 10 units QD, Novolog 0-9 units Q4H  HgbA1C - 14%. Spoke with pt about his diabetes control at home. Pt states he has been trying to call [facility] to make appt, but can't get through on the phone. States "I need someone to oversee my diabetes." [place] Spaniel he gets insulin from [facility], but has not had the money to buy meter and strips. Just found out he can get strips at [facility] for reasonable price. Has not been checking sugars.  Inpatient Diabetes Program Recommendations:     D/C Lantus and start 70/30 15 units bid. Change Novolog to 0-9 units tidwc and hs or if NPO, Q4H. TOC consult is ordered. Needs PCP appt prior to discharge.  Thank you. Ailene Ards, RD, LDN, CDE Inpatient Diabetes Coordinator [phone]

## 2019-04-25 NOTE — Telephone Encounter (Signed)
I attempted to reach out to Garrett [name] as he is highly interested in becoming a patient at [facility]. I attempted to call twice, once at 3:05pm and again at 3:36 pm. Both calls I was unable to leave a vm as his vm box is full.

## 2019-04-25 NOTE — Discharge Summary (Signed)
Physician Discharge Summary  Priest Lockridge QPY:[id number] DOB: 1985/12/08 DOA: 04/23/2019  PCP: Patient, No Pcp Per  Admit date: 04/23/2019 Discharge date: 04/25/2019  Admitted From: home Discharge disposition: home   Recommendations for Outpatient Follow-Up:   1.  follow up with PCP either [place], [place] or [place] in 3-4 weeks for evaluation of diabetes contorl 2. Take medications as prescribed. Monitor CBG's as instructed.    Discharge Diagnosis:   Principal Problem:   Encephalopathy Active Problems:   Hyperglycemia due to type 1 diabetes mellitus (HCC)   Hypertension associated with diabetes (HCC)   Elevated alkaline phosphatase level   Osteomyelitis of great toe of right foot ([place])   AKI (acute kidney injury) ([place])    Discharge Condition: Improved.  Diet recommendation: Low sodium, heart healthy.  Carbohydrate-modified.   Wound care: None.  Code status: Full.   History of Present Illness:   Aldean Pipe is a 34 y.o. male with medical history significant for type 1 diabetes, hypertension, non-compliance and recent admission for right great toe osteomyelitis s/p amputation on 05/08/2019 who presented 3/9 to the ED for evaluation of hyperglycemia.  History limited from patient due to excessive somnolence and  therefore supplemented from EDP and chart review.  Patient reportedly presented to the ED with right surgical site bleeding after stubbing his toe.  He reported abdominal pain and diarrhea.  Per ED documentation patient reportedly said he took 9 units of insulin earlier today.  Patient was otherwise too somnolent to provide any further history.   Hospital Course by Problem:   #1.  Hyperglycemia secondary to type 1 diabetes in setting of noncompliance.  Serum glucose greater than 700 on admission.  Reported he took 9 units of insulin prior to coming to the hospital.  Work-up revealed potassium of 4.6 bicarb 14 anion gap 20, hemoglobin A1c 14.  Beta hydroxybutyric acid 1.46.  Received vigorous IV fluids with follow-up CBGs in the emergency department that revealed readings of 177 and 181. Resumed home regimen of 70/30 at discharge. Instructed patient to go see his PCP in [place] if he was not going to establish PCP here. Chart review indicates admission at [facility] 09/2018. He reports getting insulin at [facility]. He verbalizes understanding and states "I will start checking my sugar and taking insulin".   #2.  Acute encephalopathy. Resolved at discharge. Initially thought to be related to #1 but he remained drowsy in spite of serum glucose correction.  Question overuse of pain medication.  Urine drug screen positive for marijuana otherwise negative.  No sign symptoms of infection.  No unexplained metabolic derangement.  Quite arousable and alert on my exam.  #3.  Acute kidney injury.  Likely related to #1. Resolved at discharge.   #4.  Hypertension. Home medications include metoprolol and lisinopril.  Pressure high end of normal.   #5.  Osteomyelitis of great toe on right foot status post amputation.  No signs of infection.  #6. Elevated alk phos. ast and alt within limits of normal. Continues to trend down at discharge. OP follow up   Medical Consultants:      Discharge Exam:   Vitals:   04/25/19 0056 04/25/19 0559  BP: (!) 136/91 (!) 137/91  Pulse: 84 81  Resp: 18   Temp: 98.3 F (36.8 C) 97.8 F (36.6 C)  SpO2: 100% 99%   Vitals:   04/24/19 1217 04/24/19 1740 04/25/19 0056 04/25/19 0559  BP: 118/77 113/68 (!) 136/91 (!) 137/91  Pulse: (!) 109 85 84 81  Resp: '18 16 18   '$ Temp: 98.2 F (36.8 C) 98.6 F (37 C) 98.3 F (36.8 C) 97.8 F (36.6 C)  TempSrc: Oral Oral Oral Oral  SpO2: 100% 99% 100% 99%  Weight:      Height:        General exam: Appears calm and comfortable un-engaging. Covers over head. No acute distress Respiratory system: Clear to auscultation. Respiratory effort  normal. Cardiovascular system: S1 & S2 heard, RRR. No JVD,  rubs, gallops or clicks. No murmurs. Gastrointestinal system: Abdomen is nondistended, soft and nontender. No organomegaly or masses felt. Normal bowel sounds heard. Central nervous system: Alert and oriented. No focal neurological deficits. Extremities: No clubbing,  or cyanosis. No edema. Surgical site right great toe clean and dry Skin: No rashes, lesions or ulcers. Psychiatry: Judgement and insight appear normal. Mood & affect flat   The results of significant diagnostics from this hospitalization (including imaging, microbiology, ancillary and laboratory) are listed below for reference.     Procedures and Diagnostic Studies:   DG Foot Complete Right  Result Date: 04/23/2019 CLINICAL DATA:  34 year old male with right foot pain. EXAM: RIGHT FOOT COMPLETE - 3+ VIEW COMPARISON:  Right foot series 04/07/2019. FINDINGS: Amputation of the right 1st phalanges since last month. Mild sclerosis of the distal 1st metatarsal. But no fracture or osteolysis identified. Calcified peripheral vascular disease. Other right foot osseous structures appear stable and intact. No adverse soft tissue features identified. IMPRESSION: 1. Right 1st toe amputation since last month with no adverse features identified. 2. Calcified peripheral vascular disease. Electronically Signed   By: Genevie Ann M.D.   On: 04/23/2019 22:22     Labs:   Basic Metabolic Panel: Recent Labs  Lab 04/23/19 1626 04/23/19 1626 04/23/19 2048 04/23/19 2048 04/23/19 2145 04/23/19 2145 04/24/19 0445 04/25/19 0611  NA 126*  --  135  --  134*  --  134* 135  K 4.6   < > 4.1   < > 3.9   < > 3.8 3.6  CL 92*  --   --   --  102  --  101 104  CO2 14*  --   --   --  22  --  21* 23  GLUCOSE 769*  --   --   --  140*  --  260* 85  BUN 30*  --   --   --  24*  --  21* 12  CREATININE 1.58*  --   --   --  0.85  --  1.07 0.74  CALCIUM 8.9  --   --   --  8.7*  --  8.4* 8.3*   < > = values  in this interval not displayed.   GFR Estimated Creatinine Clearance: 124 mL/min (by C-G formula based on SCr of 0.74 mg/dL). Liver Function Tests: Recent Labs  Lab 04/23/19 1626 04/25/19 0611  AST 27 36  ALT 44 35  ALKPHOS 229* 154*  BILITOT 1.5* 0.2*  PROT 7.4 6.3*  ALBUMIN 3.0* 2.5*   Recent Labs  Lab 04/23/19 1626  LIPASE 38   No results for input(s): AMMONIA in the last 168 hours. Coagulation profile No results for input(s): INR, PROTIME in the last 168 hours.  CBC: Recent Labs  Lab 04/23/19 1626 04/23/19 2048  WBC 9.5  --   HGB 10.7* 11.6*  HCT 34.2* 34.0*  MCV 98.3  --   PLT 400  --  Cardiac Enzymes: No results for input(s): CKTOTAL, CKMB, CKMBINDEX, TROPONINI in the last 168 hours. BNP: Invalid input(s): POCBNP CBG: Recent Labs  Lab 04/24/19 2032 04/25/19 0038 04/25/19 0415 04/25/19 0832 04/25/19 1110  GLUCAP 246* 152* 160* 182* 81   D-Dimer No results for input(s): DDIMER in the last 72 hours. Hgb A1c No results for input(s): HGBA1C in the last 72 hours. Lipid Profile No results for input(s): CHOL, HDL, LDLCALC, TRIG, CHOLHDL, LDLDIRECT in the last 72 hours. Thyroid function studies No results for input(s): TSH, T4TOTAL, T3FREE, THYROIDAB in the last 72 hours.  Invalid input(s): FREET3 Anemia work up No results for input(s): VITAMINB12, FOLATE, FERRITIN, TIBC, IRON, RETICCTPCT in the last 72 hours. Microbiology Recent Results (from the past 240 hour(s))  SARS CORONAVIRUS 2 (TAT 6-24 HRS) Nasopharyngeal Nasopharyngeal Swab     Status: None   Collection Time: 04/23/19  9:30 PM   Specimen: Nasopharyngeal Swab  Result Value Ref Range Status   SARS Coronavirus 2 NEGATIVE NEGATIVE Final    Comment: (NOTE) SARS-CoV-2 target nucleic acids are NOT DETECTED. The SARS-CoV-2 RNA is generally detectable in upper and lower respiratory specimens during the acute phase of infection. Negative results do not preclude SARS-CoV-2 infection, do not rule  out co-infections with other pathogens, and should not be used as the sole basis for treatment or other patient management decisions. Negative results must be combined with clinical observations, patient history, and epidemiological information. The expected result is Negative. Fact Sheet for Patients: SugarRoll.be Fact Sheet for Healthcare Providers: https://www.woods-mathews.com/ This test is not yet approved or cleared by the [place] FDA and  has been authorized for detection and/or diagnosis of SARS-CoV-2 by FDA under an Emergency Use Authorization (EUA). This EUA will remain  in effect (meaning this test can be used) for the duration of the COVID-19 declaration under Section 56 4(b)(1) of the Act, 21 U.S.C. section 360bbb-3(b)(1), unless the authorization is terminated or revoked sooner. Performed at [facility], [place] [address], [place], [place] [address]      Discharge Instructions:   Discharge Instructions    Call MD for:  persistant dizziness or light-headedness   Complete by: As directed    Call MD for:  temperature >100.4   Complete by: As directed    Diet - low sodium heart healthy   Complete by: As directed    Discharge instructions   Complete by: As directed    Take medications as instructed Monitor CBG's as instructed and document all readings  Take CBG readings to PCP appointment for evaluation If not going to new PCP in [place] then follow up with PCP in [place] Carb modified diet   Increase activity slowly   Complete by: As directed      Allergies as of 04/25/2019   No Known Allergies     Medication List    TAKE these medications   lisinopril 5 MG tablet Commonly known as: ZESTRIL Take 1 tablet (5 mg total) by mouth daily.   metoprolol tartrate 25 MG tablet Commonly known as: LOPRESSOR Take 0.5 tablets (12.5 mg total) by mouth 2 (two) times daily.   multivitamin with minerals Tabs tablet Take 1  tablet by mouth daily.   NovoLIN 70/30 (70-30) 100 UNIT/ML injection Generic drug: insulin NPH-regular Human Inject 20 Units into the skin See admin instructions. Inject 30 units into the skin before breakfast and 30 units at bedtime What changed: how much to take   NovoLIN R 100 units/mL injection Generic  drug: insulin regular Inject 3-4 Units into the skin See admin instructions. Inject 3-4 units into the skin "as needed" two times a day (with breakfast and dinner) per sliding scale   oxyCODONE-acetaminophen 5-325 MG tablet Commonly known as: PERCOCET/ROXICET Take 1 tablet by mouth every 6 (six) hours as needed for severe pain.         Time coordinating discharge: 40 minutes  Signed:  Radene Gunning NP [facility] 04/25/2019, 11:57 AM

## 2019-04-30 ENCOUNTER — Ambulatory Visit: Payer: Medicaid - Out of State | Admitting: Family

## 2019-04-30 NOTE — Progress Notes (Signed)
   Post-Op Visit Note   Patient: Garrett [name]           Date of Birth: 1985-10-22           MRN: [id number] Visit Date: 04/17/2019 PCP: Patient, No Pcp Per  Chief Complaint:  Chief Complaint  Patient presents with  . Right Foot - Routine Post Op    04/10/19 right GT amputation     HPI:  HPI The patient is a 34 year old gentleman seen today status post right great toe amputation. Is minimizing weight bearing.   Ortho Exam Incision is well approximated with sutures. No gaping, drainage or erythema. No sign of infection.  Visit Diagnoses:  1. Abscess of great toe, right   2. Osteomyelitis of great toe of right foot (HCC)     Plan: begin daily dial soap cleansing. Dry dressings. Minimize weight bearing. Follow up in 2 weeks for suture removal.  Follow-Up Instructions: Return in about 13 days (around 04/30/2019).   Imaging: No results found.  Orders:  No orders of the defined types were placed in this encounter.  Meds ordered this encounter  Medications  . oxyCODONE-acetaminophen (PERCOCET/ROXICET) 5-325 MG tablet    Sig: Take 1 tablet by mouth every 6 (six) hours as needed for severe pain.    Dispense:  30 tablet    Refill:  0     PMFS History: Patient Active Problem List   Diagnosis Date Noted  . Hyperglycemia 04/24/2019  . Elevated alkaline phosphatase level 04/24/2019  . Hyperglycemia due to type 1 diabetes mellitus (HCC) 04/23/2019  . AKI (acute kidney injury) (HCC) 04/23/2019  . Hypertension associated with diabetes (HCC) 04/23/2019  . Encephalopathy 04/23/2019  . Abscess of great toe, right   . Diabetic polyneuropathy associated with type 1 diabetes mellitus (HCC)   . Osteomyelitis of great toe of right foot (HCC)   . Cellulitis 04/07/2019  . Leukocytosis 04/07/2019  . Lactic acidosis 04/07/2019  . Left arm cellulitis 08/15/2018  . Diabetes mellitus with hyperglycemia (HCC) 08/15/2018  . DKA (diabetic ketoacidoses) (HCC) 03/27/2017   Past Medical  History:  Diagnosis Date  . Compression fracture of C-spine (HCC)   . Type 1 diabetes (HCC)     Family History  Family history unknown: Yes    Past Surgical History:  Procedure Laterality Date  . AMPUTATION Right 04/10/2019   Procedure: AMPUTATION GREAT RIGHT TOE;  Surgeon: Nadara Mustard, MD;  Location: [facility] OR;  Service: Orthopedics;  Laterality: Right;  . NO PAST SURGERIES     Social History   Occupational History  . Not on file  Tobacco Use  . Smoking status: Current Every Day Smoker    Packs/day: 0.50    Types: Cigarettes  . Smokeless tobacco: Never Used  Substance and Sexual Activity  . Alcohol use: Yes    Comment: occasionally  . Drug use: Yes    Types: Marijuana  . Sexual activity: Yes

## 2019-05-01 ENCOUNTER — Emergency Department
Admission: EM | Admit: 2019-05-01 | Discharge: 2019-05-01 | Disposition: A | Discharge status: Home / Self Care | Payer: Medicaid - Out of State | Attending: Emergency Medicine | Admitting: Emergency Medicine

## 2019-05-01 ENCOUNTER — Other Ambulatory Visit: Payer: Self-pay

## 2019-05-01 ENCOUNTER — Encounter: Payer: Self-pay | Admitting: Emergency Medicine

## 2019-05-01 DIAGNOSIS — F17210 Nicotine dependence, cigarettes, uncomplicated: Secondary | ICD-10-CM | POA: Insufficient documentation

## 2019-05-01 DIAGNOSIS — I10 Essential (primary) hypertension: Secondary | ICD-10-CM | POA: Insufficient documentation

## 2019-05-01 DIAGNOSIS — Z79899 Other long term (current) drug therapy: Secondary | ICD-10-CM | POA: Insufficient documentation

## 2019-05-01 DIAGNOSIS — E10649 Type 1 diabetes mellitus with hypoglycemia without coma: Secondary | ICD-10-CM

## 2019-05-01 DIAGNOSIS — Z89411 Acquired absence of right great toe: Secondary | ICD-10-CM | POA: Diagnosis not present

## 2019-05-01 DIAGNOSIS — R251 Tremor, unspecified: Secondary | ICD-10-CM | POA: Diagnosis present

## 2019-05-01 DIAGNOSIS — Z794 Long term (current) use of insulin: Secondary | ICD-10-CM | POA: Diagnosis not present

## 2019-05-01 LAB — COMPREHENSIVE METABOLIC PANEL
ALT: 51 U/L — ABNORMAL HIGH (ref 0–44)
AST: 44 U/L — ABNORMAL HIGH (ref 15–41)
Albumin: 3.4 g/dL — ABNORMAL LOW (ref 3.5–5.0)
Alkaline Phosphatase: 151 U/L — ABNORMAL HIGH (ref 38–126)
Anion gap: 7 (ref 5–15)
BUN: 13 mg/dL (ref 6–20)
CO2: 25 mmol/L (ref 22–32)
Calcium: 8.9 mg/dL (ref 8.9–10.3)
Chloride: 107 mmol/L (ref 98–111)
Creatinine, Ser: 0.87 mg/dL (ref 0.61–1.24)
GFR calc Af Amer: >60 mL/min (ref >60)
GFR calc non Af Amer: >60 mL/min (ref >60)
Glucose, Bld: 72 mg/dL (ref 70–99)
Potassium: 4 mmol/L (ref 3.5–5.1)
Sodium: 139 mmol/L (ref 135–145)
Total Bilirubin: 0.4 mg/dL (ref 0.3–1.2)
Total Protein: 7.7 g/dL (ref 6.5–8.1)

## 2019-05-01 LAB — CBC
HCT: 33 % — ABNORMAL LOW (ref 39.0–52.0)
Hemoglobin: 10.6 g/dL — ABNORMAL LOW (ref 13.0–17.0)
MCH: 30.8 pg (ref 26.0–34.0)
MCHC: 32.1 g/dL (ref 30.0–36.0)
MCV: 95.9 fL (ref 80.0–100.0)
Platelets: 312 10*3/uL (ref 150–400)
RBC: 3.44 MIL/uL — ABNORMAL LOW (ref 4.22–5.81)
RDW: 15.7 % — ABNORMAL HIGH (ref 11.5–15.5)
WBC: 7.6 10*3/uL (ref 4.0–10.5)
nRBC: 0 % (ref 0.0–0.2)

## 2019-05-01 LAB — GLUCOSE, CAPILLARY: Glucose-Capillary: 69 mg/dL — ABNORMAL LOW (ref 70–99)

## 2019-05-01 MED ORDER — BACITRACIN-NEOMYCIN-POLYMYXIN OINTMENT TUBE
TOPICAL_OINTMENT | Freq: Two times a day (BID) | CUTANEOUS | Status: DC
  Administered 2019-05-01: 1 via TOPICAL
  Filled 2019-05-01: qty 14.17

## 2019-05-01 NOTE — ED Notes (Addendum)
Pt states that he came in because he felt like his blood sugar was getting low. Pt states he was feeling weak,dizzy, and a little shaky. Pt denies checking his sugar and instead came here.  Pt also staets having pain where is toe was amputated a few weeks ago.

## 2019-05-01 NOTE — ED Notes (Signed)
Pt given 3 packets of graham crackers and cup of apple sauce to eat to prevent blood sugar from getting any lower while waiting in lobby for room assignment.

## 2019-05-01 NOTE — ED Triage Notes (Signed)
Pt ambulatory to triage states he felt like his blood sugar was low.  Pt also reports pain to right foot where he had toe amputation recently.  CBG in triage 69.

## 2019-05-01 NOTE — ED Provider Notes (Signed)
[facility] Emergency Department Provider Note  ____________________________________________   First MD Initiated Contact with Patient 05/01/19 2000     (approximate)  I have reviewed the triage vital signs and the nursing notes.   HISTORY  Chief Complaint Hypoglycemia    HPI Garrett [name] is a 34 y.o. male with below list of previous medical conditions including type 1 diabetes and recent right great toe amputation secondary to osteomyelitis presents to the emergency department secondary to feelings of hypoglycemia.  Patient currently in custody of a bail bondsman who is taking the patient to be incarcerated.  Patient states that he began feeling shaky tremulous and sweaty.  Last p.o. intake was breakfast this morning.  Patient's glucose on arrival 69.       Past Medical History:  Diagnosis Date  . Compression fracture of C-spine ([place])   . Type 1 diabetes [facility])     Patient Active Problem List   Diagnosis Date Noted  . Hyperglycemia 04/24/2019  . Elevated alkaline phosphatase level 04/24/2019  . Hyperglycemia due to type 1 diabetes mellitus ([place]) 04/23/2019  . AKI (acute kidney injury) ([place]) 04/23/2019  . Hypertension associated with diabetes ([place]) 04/23/2019  . Encephalopathy 04/23/2019  . Abscess of great toe, right   . Diabetic polyneuropathy associated with type 1 diabetes mellitus ([place])   . Osteomyelitis of great toe of right foot ([place])   . Cellulitis 04/07/2019  . Leukocytosis 04/07/2019  . Lactic acidosis 04/07/2019  . Left arm cellulitis 08/15/2018  . Diabetes mellitus with hyperglycemia ([place]) 08/15/2018  . DKA (diabetic ketoacidoses) ([place]) 03/27/2017    Past Surgical History:  Procedure Laterality Date  . AMPUTATION Right 04/10/2019   Procedure: AMPUTATION GREAT RIGHT TOE;  Surgeon: Newt Minion, MD;  Location: [place];  Service: Orthopedics;  Laterality: Right;  . NO PAST SURGERIES      Prior to Admission medications     Medication Sig Start Date End Date Taking? Authorizing Provider  insulin NPH-regular Human (NOVOLIN 70/30) (70-30) 100 UNIT/ML injection Inject 20 Units into the skin See admin instructions. Inject 30 units into the skin before breakfast and 30 units at bedtime Patient taking differently: Inject 30 Units into the skin See admin instructions. Inject 30 units into the skin before breakfast and 30 units at bedtime 04/11/19 05/11/19  Guilford Shi, MD  insulin regular (NOVOLIN R) 100 units/mL injection Inject 3-4 Units into the skin See admin instructions. Inject 3-4 units into the skin "as needed" two times a day (with breakfast and dinner) per sliding scale    [provider]  lisinopril (ZESTRIL) 5 MG tablet Take 1 tablet (5 mg total) by mouth daily. 04/11/19   [place]ford Shi, MD  metoprolol tartrate (LOPRESSOR) 25 MG tablet Take 0.5 tablets (12.5 mg total) by mouth 2 (two) times daily. 04/11/19   [place]ford Shi, MD  Multiple Vitamin (MULTIVITAMIN WITH MINERALS) TABS tablet Take 1 tablet by mouth daily. 04/12/19   [place]ford Shi, MD  oxyCODONE-acetaminophen (PERCOCET/ROXICET) 5-325 MG tablet Take 1 tablet by mouth every 6 (six) hours as needed for severe pain. 04/17/19   Suzan Slick, NP    Allergies Patient has no known allergies.  Family History  Family history unknown: Yes    Social History Social History   Tobacco Use  . Smoking status: Current Every Day Smoker    Packs/day: 0.50    Types: Cigarettes  . Smokeless tobacco: Never Used  Substance Use Topics  . Alcohol use: Yes  Comment: occasionally  . Drug use: Yes    Types: Marijuana    Review of Systems Constitutional: No fever/chills Eyes: No visual changes. ENT: No sore throat. Cardiovascular: Denies chest pain. Respiratory: Denies shortness of breath. Gastrointestinal: No abdominal pain.  No nausea, no vomiting.  No diarrhea.  No constipation. Genitourinary: Negative for dysuria. Musculoskeletal:  Negative for neck pain.  Negative for back pain. Integumentary: Negative for rash. Neurological: Negative for headaches, focal weakness or numbness. Endocrine: Positive for hypoglycemia  ____________________________________________   PHYSICAL EXAM:  VITAL SIGNS: ED Triage Vitals  Enc Vitals Group     BP 05/01/19 1852 134/83     Pulse Rate 05/01/19 1852 (!) 103     Resp 05/01/19 1852 18     Temp 05/01/19 1852 98.4 F (36.9 C)     Temp Source 05/01/19 1852 Oral     SpO2 05/01/19 1852 100 %     Weight 05/01/19 1853 68 kg (149 lb 14.6 oz)     Height 05/01/19 1853 1.753 m (5\' 9" )     Head Circumference --      Peak Flow --      Pain Score 05/01/19 1853 8     Pain Loc --      Pain Edu? --      Excl. in GC? --     Constitutional: Alert and oriented.  Eyes: Conjunctivae are normal.  Mouth/Throat: Patient is wearing a mask. Neck: No stridor.  No meningeal signs.   Cardiovascular: Normal rate, regular rhythm. Good peripheral circulation. Grossly normal heart sounds. Respiratory: Normal respiratory effort.  No retractions. Gastrointestinal: Soft and nontender. No distention.  Musculoskeletal: Right great toe wound clean dry intact without signs of infection. Neurologic:  Normal speech and language. No gross focal neurologic deficits are appreciated.  Skin:  Skin is warm, dry and intact.  Right great toe surgical wound clean dry intact without signs of infection Psychiatric: Mood and affect are normal. Speech and behavior are normal.  ____________________________________________   LABS (all labs ordered are listed, but only abnormal results are displayed)  Labs Reviewed  GLUCOSE, CAPILLARY - Abnormal; Notable for the following components:      Result Value   Glucose-Capillary 69 (*)    All other components within normal limits  CBC - Abnormal; Notable for the following components:   RBC 3.44 (*)    Hemoglobin 10.6 (*)    HCT 33.0 (*)    RDW 15.7 (*)    All other  components within normal limits  COMPREHENSIVE METABOLIC PANEL - Abnormal; Notable for the following components:   Albumin 3.4 (*)    AST 44 (*)    ALT 51 (*)    Alkaline Phosphatase 151 (*)    All other components within normal limits       Procedures   ____________________________________________   INITIAL IMPRESSION / MDM / ASSESSMENT AND PLAN / ED COURSE  As part of my medical decision making, I reviewed the following data within the electronic MEDICAL RECORD NUMBER   34 year old male presented with above-stated history and physical exam consistent with hypoglycemia.  Patient was given something to eat x2 in the emergency department.  Symptoms resolved.  Patient's right great toe surgical wound cleaned antibiotic ointment applied followed by gauze dressing.  ____________________________________________  FINAL CLINICAL IMPRESSION(S) / ED DIAGNOSES  Final diagnoses:  Type 1 diabetes mellitus with hypoglycemia and without coma (HCC)     MEDICATIONS GIVEN DURING THIS VISIT:  Medications  neomycin-bacitracin-polymyxin (NEOSPORIN)  ointment (has no administration in time range)     ED Discharge Orders    None      *Please note:  Garrett [name] was evaluated in Emergency Department on 05/01/2019 for the symptoms described in the history of present illness. He was evaluated in the context of the global COVID-19 pandemic, which necessitated consideration that the patient might be at risk for infection with the SARS-CoV-2 virus that causes COVID-19. Institutional protocols and algorithms that pertain to the evaluation of patients at risk for COVID-19 are in a state of rapid change based on information released by regulatory bodies including the CDC and federal and state organizations. These policies and algorithms were followed during the patient's care in the ED.  Some ED evaluations and interventions may be delayed as a result of limited staffing during the pandemic.*  Note:  This  document was prepared using Dragon voice recognition software and may include unintentional dictation errors.   Darci Current, MD 05/01/19 2027

## 2019-05-01 NOTE — ED Notes (Signed)
Pt observed by this RN walking to the bathroom at this time. Pt tolerated well, no assistance needed.

## 2019-05-03 ENCOUNTER — Ambulatory Visit: Payer: Medicaid - Out of State | Admitting: Physician Assistant

## 2019-05-13 ENCOUNTER — Other Ambulatory Visit: Payer: Self-pay | Admitting: Physician Assistant

## 2019-05-13 ENCOUNTER — Telehealth: Payer: Self-pay | Admitting: Family

## 2019-05-13 MED ORDER — OXYCODONE-ACETAMINOPHEN 5-325 MG PO TABS: 1.0000 | ORAL_TABLET | Freq: Four times a day (QID) | ORAL | 0 refills | Status: DC | PRN

## 2019-05-13 NOTE — Telephone Encounter (Signed)
Please advise 

## 2019-05-13 NOTE — Telephone Encounter (Signed)
I tried to call patient to advise. Mailbox is full and not accepting new messages. Please advise medication changes can be discussed at follow up appt with Erin.  Refill of original pain med sent to pharmacy by [place].

## 2019-05-13 NOTE — Telephone Encounter (Signed)
Refilled rx. Has an appointment on Wed with Erin . Can discuss changes in medication at that time.

## 2019-05-13 NOTE — Telephone Encounter (Signed)
Patient called requesting a refill of oxycodone. Patient requesting higher mg. Patient states the lower mg didn't work as well. Please send to pharmacy on file. Patient states to call if need be. Patient phone number is [phone].

## 2019-05-15 ENCOUNTER — Other Ambulatory Visit: Payer: Self-pay

## 2019-05-15 ENCOUNTER — Encounter: Payer: Self-pay | Admitting: Family

## 2019-05-15 ENCOUNTER — Ambulatory Visit (INDEPENDENT_AMBULATORY_CARE_PROVIDER_SITE_OTHER): Payer: Self-pay | Admitting: Family

## 2019-05-15 VITALS — Ht 69.0 in | Wt 149.0 lb

## 2019-05-15 DIAGNOSIS — M869 Osteomyelitis, unspecified: Secondary | ICD-10-CM

## 2019-05-15 DIAGNOSIS — S98111A Complete traumatic amputation of right great toe, initial encounter: Secondary | ICD-10-CM

## 2019-05-15 MED ORDER — OXYCODONE-ACETAMINOPHEN 5-325 MG PO TABS: 1.0000 | ORAL_TABLET | Freq: Three times a day (TID) | ORAL | 0 refills | Status: DC | PRN

## 2019-05-15 NOTE — Progress Notes (Signed)
   Post-Op Visit Note   Patient: Garrett [name]           Date of Birth: [birth year]           MRN: [id number] Visit Date: 05/15/2019 PCP: Patient, No Pcp Per  Chief Complaint:  Chief Complaint  Patient presents with  . Right Foot - Routine Post Op    04/10/19 right GT amputation     HPI:  HPI The patient is a 34 year old gentleman seen today status post right great toe amputation February 24.  He has been nonweightbearing in a wheelchair he is wearing a postop shoe today been doing dry dressing changes he does complain of pain much worse at night he has pain with weightbearing feels like he has a knot underneath his amputation site.  Ortho Exam Incision well approximated with sutures well-healed sutures harvested today.  There is no drainage no erythema no odor no sign of infection  Visit Diagnoses:  1. Osteomyelitis of great toe of right foot (HCC)   2. 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  Tolerated suture removal well we will call in 1 last pain medicine prescription he may advance his weightbearing in regular shoewear he will follow-up 1 more time in 3 weeks.  Follow-Up Instructions: Return in about 3 weeks (around 06/05/2019), or if symptoms worsen or fail to improve.   Imaging: No results found.  Orders:  No orders of the defined types were placed in this encounter.  No orders of the defined types were placed in this encounter.    PMFS History: Patient Active Problem List   Diagnosis Date Noted  . Amputated great toe of right foot (HCC) 05/15/2019  . Hyperglycemia 04/24/2019  . Elevated alkaline phosphatase level 04/24/2019  . Hyperglycemia due to type 1 diabetes mellitus (HCC) 04/23/2019  . AKI (acute kidney injury) (HCC) 04/23/2019  . Hypertension associated with diabetes (HCC) 04/23/2019  . Encephalopathy 04/23/2019  . Abscess of great toe, right   . Diabetic polyneuropathy associated with type 1 diabetes  mellitus (HCC)   . Osteomyelitis of great toe of right foot (HCC)   . Cellulitis 04/07/2019  . Leukocytosis 04/07/2019  . Lactic acidosis 04/07/2019  . Left arm cellulitis 08/15/2018  . Diabetes mellitus with hyperglycemia (HCC) 08/15/2018  . DKA (diabetic ketoacidoses) (HCC) 03/27/2017   Past Medical History:  Diagnosis Date  . Compression fracture of C-spine (HCC)   . Type 1 diabetes (HCC)     Family History  Family history unknown: Yes    Past Surgical History:  Procedure Laterality Date  . AMPUTATION Right 04/10/2019   Procedure: AMPUTATION GREAT RIGHT TOE;  Surgeon: Nadara Mustard, MD;  Location: [facility] OR;  Service: Orthopedics;  Laterality: Right;  . NO PAST SURGERIES     Social History   Occupational History  . Not on file  Tobacco Use  . Smoking status: Current Every Day Smoker    Packs/day: 0.50    Types: Cigarettes  . Smokeless tobacco: Never Used  Substance and Sexual Activity  . Alcohol use: Yes    Comment: occasionally  . Drug use: Yes    Types: Marijuana  . Sexual activity: Yes

## 2019-05-28 ENCOUNTER — Telehealth: Payer: Self-pay | Admitting: Orthopedic Surgery

## 2019-05-28 ENCOUNTER — Other Ambulatory Visit: Payer: Self-pay | Admitting: Family

## 2019-05-28 ENCOUNTER — Other Ambulatory Visit: Payer: Self-pay | Admitting: Orthopedic Surgery

## 2019-05-28 MED ORDER — OXYCODONE-ACETAMINOPHEN 5-325 MG PO TABS: 1.0000 | ORAL_TABLET | Freq: Two times a day (BID) | ORAL | 0 refills | Status: DC | PRN

## 2019-05-28 NOTE — Telephone Encounter (Signed)
Patient's girlfriend called to request an RX refill on his Oxycodone.  Patient uses [facility] in [place] on [address]  CB#[phone].  Thank you.

## 2019-05-28 NOTE — Telephone Encounter (Signed)
Pt is s/p a right GT amputation 04/10/19 and is requesting a refill on his percocet last refill was 05/15/19 #21 please advise.

## 2019-05-28 NOTE — Telephone Encounter (Signed)
Rx sent 

## 2019-06-07 ENCOUNTER — Ambulatory Visit: Payer: Medicaid - Out of State | Admitting: Family

## 2019-06-15 ENCOUNTER — Other Ambulatory Visit: Payer: Self-pay | Admitting: Orthopedic Surgery

## 2019-06-16 ENCOUNTER — Other Ambulatory Visit: Payer: Self-pay | Admitting: Orthopedic Surgery

## 2019-06-17 NOTE — Telephone Encounter (Signed)
Please advise 

## 2019-06-24 ENCOUNTER — Other Ambulatory Visit: Payer: Self-pay | Admitting: Orthopedic Surgery

## 2019-06-25 NOTE — Telephone Encounter (Signed)
Do you want to refill? 

## 2019-06-25 NOTE — Telephone Encounter (Signed)
MD.

## 2019-06-27 ENCOUNTER — Telehealth: Payer: Self-pay | Admitting: Family

## 2019-06-27 NOTE — Telephone Encounter (Signed)
Called patient left voicemail to return call to schedule an appointment per [facility] message with [facility] for right foot draining.

## 2019-07-01 ENCOUNTER — Telehealth: Payer: Self-pay | Admitting: Family

## 2019-07-01 NOTE — Telephone Encounter (Signed)
Called patient left message to return call to schedule an appointment with Denny Peon concerning right foot drainage per [employer]

## 2019-07-25 ENCOUNTER — Telehealth: Payer: Self-pay | Admitting: Orthopedic Surgery

## 2019-07-25 NOTE — Telephone Encounter (Signed)
Called patient left message [phone] to return call to schedule an appointment with Erin. MaryAnne or Dr Lajoyce Corners for his foot

## 2019-09-01 ENCOUNTER — Other Ambulatory Visit: Payer: Self-pay

## 2019-09-01 ENCOUNTER — Emergency Department: Payer: Medicaid - Out of State

## 2019-09-01 ENCOUNTER — Inpatient Hospital Stay
Admission: EM | Admit: 2019-09-01 | Discharge: 2019-09-03 | DRG: 638 | Disposition: A | Discharge status: Home / Self Care | Payer: Medicaid - Out of State | Attending: Internal Medicine | Admitting: Internal Medicine

## 2019-09-01 ENCOUNTER — Encounter: Payer: Self-pay | Admitting: Emergency Medicine

## 2019-09-01 DIAGNOSIS — E860 Dehydration: Secondary | ICD-10-CM | POA: Diagnosis present

## 2019-09-01 DIAGNOSIS — R197 Diarrhea, unspecified: Secondary | ICD-10-CM

## 2019-09-01 DIAGNOSIS — E1069 Type 1 diabetes mellitus with other specified complication: Secondary | ICD-10-CM | POA: Diagnosis not present

## 2019-09-01 DIAGNOSIS — E1100 Type 2 diabetes mellitus with hyperosmolarity without nonketotic hyperglycemic-hyperosmolar coma (NKHHC): Secondary | ICD-10-CM | POA: Insufficient documentation

## 2019-09-01 DIAGNOSIS — E871 Hypo-osmolality and hyponatremia: Secondary | ICD-10-CM | POA: Diagnosis present

## 2019-09-01 DIAGNOSIS — Z833 Family history of diabetes mellitus: Secondary | ICD-10-CM

## 2019-09-01 DIAGNOSIS — N179 Acute kidney failure, unspecified: Secondary | ICD-10-CM | POA: Diagnosis present

## 2019-09-01 DIAGNOSIS — I152 Hypertension secondary to endocrine disorders: Secondary | ICD-10-CM | POA: Diagnosis present

## 2019-09-01 DIAGNOSIS — E869 Volume depletion, unspecified: Secondary | ICD-10-CM | POA: Diagnosis present

## 2019-09-01 DIAGNOSIS — E870 Hyperosmolality and hypernatremia: Secondary | ICD-10-CM

## 2019-09-01 DIAGNOSIS — Z89411 Acquired absence of right great toe: Secondary | ICD-10-CM | POA: Diagnosis not present

## 2019-09-01 DIAGNOSIS — E1065 Type 1 diabetes mellitus with hyperglycemia: Secondary | ICD-10-CM | POA: Diagnosis present

## 2019-09-01 DIAGNOSIS — F17210 Nicotine dependence, cigarettes, uncomplicated: Secondary | ICD-10-CM | POA: Diagnosis present

## 2019-09-01 DIAGNOSIS — Z794 Long term (current) use of insulin: Secondary | ICD-10-CM | POA: Diagnosis not present

## 2019-09-01 DIAGNOSIS — Z20822 Contact with and (suspected) exposure to covid-19: Secondary | ICD-10-CM | POA: Diagnosis present

## 2019-09-01 DIAGNOSIS — Z79899 Other long term (current) drug therapy: Secondary | ICD-10-CM | POA: Diagnosis not present

## 2019-09-01 DIAGNOSIS — E1159 Type 2 diabetes mellitus with other circulatory complications: Secondary | ICD-10-CM | POA: Diagnosis not present

## 2019-09-01 DIAGNOSIS — I10 Essential (primary) hypertension: Secondary | ICD-10-CM | POA: Diagnosis not present

## 2019-09-01 DIAGNOSIS — K529 Noninfective gastroenteritis and colitis, unspecified: Secondary | ICD-10-CM | POA: Diagnosis present

## 2019-09-01 LAB — URINALYSIS, COMPLETE (UACMP) WITH MICROSCOPIC
Bilirubin Urine: NEGATIVE
Glucose, UA: >=500 mg/dL — AB
Hgb urine dipstick: NEGATIVE
Ketones, ur: 5 mg/dL — AB
Leukocytes,Ua: NEGATIVE
Nitrite: NEGATIVE
Protein, ur: NEGATIVE mg/dL
Specific Gravity, Urine: 1.023 (ref 1.005–1.030)
Squamous Epithelial / HPF: NONE SEEN (ref 0–5)
pH: 5 (ref 5.0–8.0)

## 2019-09-01 LAB — CBC
HCT: 35.4 % — ABNORMAL LOW (ref 39.0–52.0)
Hemoglobin: 10.3 g/dL — ABNORMAL LOW (ref 13.0–17.0)
MCH: 30.5 pg (ref 26.0–34.0)
MCHC: 29.1 g/dL — ABNORMAL LOW (ref 30.0–36.0)
MCV: 104.7 fL — ABNORMAL HIGH (ref 80.0–100.0)
Platelets: 357 10*3/uL (ref 150–400)
RBC: 3.38 MIL/uL — ABNORMAL LOW (ref 4.22–5.81)
RDW: 16.1 % — ABNORMAL HIGH (ref 11.5–15.5)
WBC: 13.3 10*3/uL — ABNORMAL HIGH (ref 4.0–10.5)
nRBC: 0 % (ref 0.0–0.2)

## 2019-09-01 LAB — GLUCOSE, CAPILLARY
Glucose-Capillary: >600 mg/dL (ref 70–99)
Glucose-Capillary: >600 mg/dL (ref 70–99)
Glucose-Capillary: >600 mg/dL (ref 70–99)

## 2019-09-01 LAB — OSMOLALITY, URINE: Osmolality, Ur: 478 mOsm/kg (ref 300–900)

## 2019-09-01 LAB — SARS CORONAVIRUS 2 BY RT PCR (HOSPITAL ORDER, PERFORMED IN ~~LOC~~ HOSPITAL LAB): SARS Coronavirus 2: NEGATIVE

## 2019-09-01 LAB — COMPREHENSIVE METABOLIC PANEL
ALT: 53 U/L — ABNORMAL HIGH (ref 0–44)
AST: 35 U/L (ref 15–41)
Albumin: 3.1 g/dL — ABNORMAL LOW (ref 3.5–5.0)
Alkaline Phosphatase: 245 U/L — ABNORMAL HIGH (ref 38–126)
Anion gap: 13 (ref 5–15)
BUN: 24 mg/dL — ABNORMAL HIGH (ref 6–20)
CO2: 20 mmol/L — ABNORMAL LOW (ref 22–32)
Calcium: 8.5 mg/dL — ABNORMAL LOW (ref 8.9–10.3)
Chloride: 84 mmol/L — ABNORMAL LOW (ref 98–111)
Creatinine, Ser: 1.73 mg/dL — ABNORMAL HIGH (ref 0.61–1.24)
GFR calc Af Amer: 58 mL/min — ABNORMAL LOW (ref >60)
GFR calc non Af Amer: 50 mL/min — ABNORMAL LOW (ref >60)
Glucose, Bld: 1115 mg/dL (ref 70–99)
Potassium: 5.1 mmol/L (ref 3.5–5.1)
Sodium: 117 mmol/L — CL (ref 135–145)
Total Bilirubin: 1.4 mg/dL — ABNORMAL HIGH (ref 0.3–1.2)
Total Protein: 7.8 g/dL (ref 6.5–8.1)

## 2019-09-01 LAB — OSMOLALITY: Osmolality: 333 mOsm/kg (ref 275–295)

## 2019-09-01 MED ORDER — DEXTROSE-NACL 5-0.45 % IV SOLN: INTRAVENOUS | Status: DC

## 2019-09-01 MED ORDER — METOPROLOL TARTRATE 25 MG PO TABS
12.5000 mg | ORAL_TABLET | Freq: Two times a day (BID) | ORAL | Status: DC
  Administered 2019-09-02 – 2019-09-03 (×3): 12.5 mg via ORAL
  Filled 2019-09-01 (×3): qty 1

## 2019-09-01 MED ORDER — DEXTROSE 50 % IV SOLN: 0.0000 mL | INTRAVENOUS | Status: DC | PRN

## 2019-09-01 MED ORDER — SODIUM CHLORIDE 0.9 % IV BOLUS
1000.0000 mL | INTRAVENOUS | Status: AC
  Administered 2019-09-01: 1000 mL via INTRAVENOUS

## 2019-09-01 MED ORDER — LOPERAMIDE HCL 2 MG PO CAPS
2.0000 mg | ORAL_CAPSULE | ORAL | Status: DC | PRN
  Administered 2019-09-02: 2 mg via ORAL
  Filled 2019-09-01 (×2): qty 1

## 2019-09-01 MED ORDER — ADULT MULTIVITAMIN W/MINERALS CH
1.0000 | ORAL_TABLET | Freq: Every day | ORAL | Status: DC
  Administered 2019-09-02 – 2019-09-03 (×2): 1 via ORAL
  Filled 2019-09-01 (×2): qty 1

## 2019-09-01 MED ORDER — LISINOPRIL 5 MG PO TABS: 5.0000 mg | ORAL_TABLET | Freq: Every day | ORAL | Status: DC

## 2019-09-01 MED ORDER — SODIUM CHLORIDE 0.9 % IV SOLN: INTRAVENOUS | Status: DC

## 2019-09-01 MED ORDER — CHLORHEXIDINE GLUCONATE CLOTH 2 % EX PADS
6.0000 | MEDICATED_PAD | Freq: Every day | CUTANEOUS | Status: DC
  Administered 2019-09-02 – 2019-09-03 (×2): 6 via TOPICAL

## 2019-09-01 MED ORDER — OXYCODONE-ACETAMINOPHEN 5-325 MG PO TABS: 1.0000 | ORAL_TABLET | Freq: Four times a day (QID) | ORAL | Status: DC | PRN

## 2019-09-01 MED ORDER — INSULIN REGULAR(HUMAN) IN NACL 100-0.9 UT/100ML-% IV SOLN
INTRAVENOUS | Status: DC
  Administered 2019-09-01: 8 [IU]/h via INTRAVENOUS
  Filled 2019-09-01: qty 100

## 2019-09-01 MED ORDER — SODIUM CHLORIDE 0.9 % IV BOLUS
1000.0000 mL | Freq: Once | INTRAVENOUS | Status: AC
  Administered 2019-09-01: 1000 mL via INTRAVENOUS

## 2019-09-01 MED ORDER — ENOXAPARIN SODIUM 40 MG/0.4ML ~~LOC~~ SOLN
40.0000 mg | SUBCUTANEOUS | Status: DC
  Administered 2019-09-02: 40 mg via SUBCUTANEOUS
  Filled 2019-09-01: qty 0.4

## 2019-09-01 MED ORDER — INSULIN REGULAR(HUMAN) IN NACL 100-0.9 UT/100ML-% IV SOLN: INTRAVENOUS | Status: DC

## 2019-09-01 NOTE — ED Notes (Signed)
No change in insulin. Remains at 8u/hour.

## 2019-09-01 NOTE — ED Provider Notes (Signed)
[facility] Emergency Department Provider Note  ____________________________________________  Time seen: Approximately 10:29 PM  I have reviewed the triage vital signs and the nursing notes.   HISTORY  Chief Complaint Diarrhea and Hyperglycemia    HPI Garrett [name] is a 34 y.o. male with a history of insulin-dependent type 1 diabetes who comes ED complaining of malaise and fatigue for the past 3 days.  He ran out of his blood sugar testers over that time and stopped checking his blood sugar but last he saw 3 days ago was 350.  He has not taken his insulin in the last 2 days due to not eating as well.  But he continues to feel worse.  Also notes diarrhea over the past 2 months.  Denies fever but has chills.  No chest pain shortness of breath or abdominal pain.  No syncope.  Symptoms are constant, waxing and waning, no aggravating or alleviating factors.   Positive polyuria and polydipsia   Past Medical History:  Diagnosis Date  . Compression fracture of C-spine (HCC)   . Type 1 diabetes [facility])      Patient Active Problem List   Diagnosis Date Noted  . Type 2 diabetes mellitus with hyperosmolar hyperglycemic state (HHS) (HCC) 09/01/2019  . Amputated great toe of right foot (HCC) 05/15/2019  . Hyperglycemia 04/24/2019  . Elevated alkaline phosphatase level 04/24/2019  . Hyperglycemia due to type 1 diabetes mellitus (HCC) 04/23/2019  . AKI (acute kidney injury) (HCC) 04/23/2019  . Hypertension associated with diabetes (HCC) 04/23/2019  . Encephalopathy 04/23/2019  . Abscess of great toe, right   . Diabetic polyneuropathy associated with type 1 diabetes mellitus (HCC)   . Osteomyelitis of great toe of right foot (HCC)   . Cellulitis 04/07/2019  . Leukocytosis 04/07/2019  . Lactic acidosis 04/07/2019  . Left arm cellulitis 08/15/2018  . Diabetes mellitus with hyperglycemia (HCC) 08/15/2018  . DKA (diabetic ketoacidoses) (HCC) 03/27/2017     Past  Surgical History:  Procedure Laterality Date  . AMPUTATION Right 04/10/2019   Procedure: AMPUTATION GREAT RIGHT TOE;  Surgeon: Nadara Mustard, MD;  Location: [facility] OR;  Service: Orthopedics;  Laterality: Right;  . NO PAST SURGERIES       Prior to Admission medications   Medication Sig Start Date End Date Taking? Authorizing Provider  insulin NPH-regular Human (NOVOLIN 70/30) (70-30) 100 UNIT/ML injection Inject 20 Units into the skin See admin instructions. Inject 30 units into the skin before breakfast and 30 units at bedtime Patient taking differently: Inject 30 Units into the skin See admin instructions. Inject 30 units into the skin before breakfast and 30 units at bedtime 04/11/19 09/01/19 Yes Kamineni, Sallye Ober, MD  insulin regular (NOVOLIN R) 100 units/mL injection Inject 3-4 Units into the skin See admin instructions. Inject 3-4 units into the skin "as needed" two times a day (with breakfast and dinner) per sliding scale   Yes [provider]  lisinopril (ZESTRIL) 5 MG tablet Take 1 tablet (5 mg total) by mouth daily. Patient not taking: Reported on 09/01/2019 04/11/19   Alessandra Bevels, MD  metoprolol tartrate (LOPRESSOR) 25 MG tablet Take 0.5 tablets (12.5 mg total) by mouth 2 (two) times daily. Patient not taking: Reported on 09/01/2019 04/11/19   Alessandra Bevels, MD  Multiple Vitamin (MULTIVITAMIN WITH MINERALS) TABS tablet Take 1 tablet by mouth daily. Patient not taking: Reported on 09/01/2019 04/12/19   Alessandra Bevels, MD     Allergies Patient has no known allergies.  Family History  Family history unknown: Yes    Social History Social History   Tobacco Use  . Smoking status: Current Every Day Smoker    Packs/day: 0.50    Types: Cigarettes  . Smokeless tobacco: Never Used  Vaping Use  . Vaping Use: Never used  Substance Use Topics  . Alcohol use: Yes    Comment: occasionally  . Drug use: Yes    Types: Marijuana    Review of Systems  Constitutional:    No fever positive chills.  ENT:   No sore throat. No rhinorrhea. Cardiovascular:   No chest pain or syncope. Respiratory:   No dyspnea or cough. Gastrointestinal:   Negative for abdominal pain, vomiting and diarrhea.  Musculoskeletal:   Negative for focal pain or swelling All other systems reviewed and are negative except as documented above in ROS and HPI.  ____________________________________________   PHYSICAL EXAM:  VITAL SIGNS: ED Triage Vitals  Enc Vitals Group     BP 09/01/19 1938 111/74     Pulse Rate 09/01/19 1938 (!) 105     Resp 09/01/19 1938 18     Temp 09/01/19 1938 98.6 F (37 C)     Temp Source 09/01/19 1938 Oral     SpO2 09/01/19 1938 100 %     Weight 09/01/19 1939 145 lb (65.8 kg)     Height 09/01/19 1939 5\' 9"  (1.753 m)     Head Circumference --      Peak Flow --      Pain Score 09/01/19 1939 10     Pain Loc --      Pain Edu? --      Excl. in GC? --     Vital signs reviewed, nursing assessments reviewed.   Constitutional:   Alert and oriented. Non-toxic appearance. Eyes:   Conjunctivae are normal. EOMI. PERRL. ENT      Head:   Normocephalic and atraumatic.      Nose:   Normal.      Mouth/Throat:   Dry mucous membranes      Neck:   No meningismus. Full ROM. Hematological/Lymphatic/Immunilogical:   No cervical lymphadenopathy. Cardiovascular:   Tachycardia heart rate 105. Symmetric bilateral radial and DP pulses.  No murmurs. Cap refill less than 2 seconds. Respiratory:   Normal respiratory effort without tachypnea/retractions. Breath sounds are clear and equal bilaterally. No wheezes/rales/rhonchi. Gastrointestinal:   Soft and nontender. Non distended. There is no CVA tenderness.  No rebound, rigidity, or guarding.  Musculoskeletal:   Normal range of motion in all extremities. No joint effusions.  No lower extremity tenderness.  No edema. Neurologic:   Normal speech and language.  Motor grossly intact. No acute focal neurologic deficits are  appreciated.  Skin:    Skin is warm, dry and intact. No rash noted.  No petechiae, purpura, or bullae.  ____________________________________________    LABS (pertinent positives/negatives) (all labs ordered are listed, but only abnormal results are displayed) Labs Reviewed  GLUCOSE, CAPILLARY - Abnormal; Notable for the following components:      Result Value   Glucose-Capillary >600 (*)    All other components within normal limits  COMPREHENSIVE METABOLIC PANEL - Abnormal; Notable for the following components:   Sodium 117 (*)    Chloride 84 (*)    CO2 20 (*)    Glucose, Bld 1,115 (*)    BUN 24 (*)    Creatinine, Ser 1.73 (*)    Calcium 8.5 (*)    Albumin 3.1 (*)  ALT 53 (*)    Alkaline Phosphatase 245 (*)    Total Bilirubin 1.4 (*)    GFR calc non Af Amer 50 (*)    GFR calc Af Amer 58 (*)    All other components within normal limits  CBC - Abnormal; Notable for the following components:   WBC 13.3 (*)    RBC 3.38 (*)    Hemoglobin 10.3 (*)    HCT 35.4 (*)    MCV 104.7 (*)    MCHC 29.1 (*)    RDW 16.1 (*)    All other components within normal limits  URINALYSIS, COMPLETE (UACMP) WITH MICROSCOPIC - Abnormal; Notable for the following components:   Color, Urine STRAW (*)    APPearance CLEAR (*)    Glucose, UA >=500 (*)    Ketones, ur 5 (*)    Bacteria, UA RARE (*)    All other components within normal limits  GLUCOSE, CAPILLARY - Abnormal; Notable for the following components:   Glucose-Capillary >600 (*)    All other components within normal limits  GASTROINTESTINAL PANEL BY PCR, STOOL (REPLACES STOOL CULTURE)  C DIFFICILE QUICK SCREEN W PCR REFLEX  SARS CORONAVIRUS 2 BY RT PCR (HOSPITAL ORDER, PERFORMED IN [facility])  OSMOLALITY  HIV ANTIBODY (ROUTINE TESTING W REFLEX)  BASIC METABOLIC PANEL  BASIC METABOLIC PANEL  BASIC METABOLIC PANEL  BASIC METABOLIC PANEL  OSMOLALITY  HEMOGLOBIN A1C  CBG MONITORING, ED    ____________________________________________   EKG    ____________________________________________    RADIOLOGY  No results found.  ____________________________________________   PROCEDURES .Critical Care Performed by: Sharman Cheek, MD Authorized by: Sharman Cheek, MD   Critical care provider statement:    Critical care time (minutes):  35   Critical care time was exclusive of:  Separately billable procedures and treating other patients   Critical care was necessary to treat or prevent imminent or life-threatening deterioration of the following conditions:  Dehydration, metabolic crisis, renal failure and endocrine crisis   Critical care was time spent personally by me on the following activities:  Development of treatment plan with patient or surrogate, discussions with consultants, evaluation of patient's response to treatment, examination of patient, obtaining history from patient or surrogate, ordering and performing treatments and interventions, ordering and review of laboratory studies, ordering and review of radiographic studies, pulse oximetry, re-evaluation of patient's condition and review of old charts    ____________________________________________    CLINICAL IMPRESSION / ASSESSMENT AND PLAN / ED COURSE  Medications ordered in the ED: Medications  insulin regular, human (MYXREDLIN) 100 units/ 100 mL infusion (8 Units/hr Intravenous New Bag/Given 09/01/19 2147)  0.9 %  sodium chloride infusion (has no administration in time range)  dextrose 5 %-0.45 % sodium chloride infusion (has no administration in time range)  dextrose 50 % solution 0-50 mL (has no administration in time range)  oxyCODONE-acetaminophen (PERCOCET/ROXICET) 5-325 MG per tablet 1 tablet (has no administration in time range)  lisinopril (ZESTRIL) tablet 5 mg (has no administration in time range)  metoprolol tartrate (LOPRESSOR) tablet 12.5 mg (has no administration in time range)   multivitamin with minerals tablet 1 tablet (has no administration in time range)  enoxaparin (LOVENOX) injection 40 mg (has no administration in time range)  0.9 %  sodium chloride infusion (has no administration in time range)  dextrose 5 %-0.45 % sodium chloride infusion (has no administration in time range)  dextrose 50 % solution 0-50 mL (has no administration in time range)  0.9 %  sodium chloride infusion (has no administration in time range)  sodium chloride 0.9 % bolus 1,000 mL (0 mLs Intravenous Stopped 09/01/19 2107)  sodium chloride 0.9 % bolus 1,000 mL (1,000 mLs Intravenous New Bag/Given 09/01/19 2146)    Pertinent labs & imaging results that were available during my care of the patient were reviewed by me and considered in my medical decision making (see chart for details).  Garrett [name] was evaluated in Emergency Department on 09/01/2019 for the symptoms described in the history of present illness. He was evaluated in the context of the global COVID-19 pandemic, which necessitated consideration that the patient might be at risk for infection with the SARS-CoV-2 virus that causes COVID-19. Institutional protocols and algorithms that pertain to the evaluation of patients at risk for COVID-19 are in a state of rapid change based on information released by regulatory bodies including the CDC and federal and state organizations. These policies and algorithms were followed during the patient's care in the ED.   Patient presents with malaise and fatigue, tachycardia.  Found to have a blood sugar of about 1100 with severe hyponatremia.  No metabolic acidosis or pronounced ketosis on urine, consistent with HHS.  No evidence of infection, he is not septic.  Patient ordered 2 L normal saline bolus, insulin drip.  Potassium is 5.1 so does not need supplementation at this time.  There is evidence of AKI on labs.  Discussed with hospitalist for admission.       ____________________________________________   FINAL CLINICAL IMPRESSION(S) / ED DIAGNOSES    Final diagnoses:  Type 1 diabetes mellitus with hyperosmolar hyperglycemic state (HHS) (HCC)  AKI   ED Discharge Orders    None      Portions of this note were generated with dragon dictation software. Dictation errors may occur despite best attempts at proofreading.   Sharman Cheek, MD 09/01/19 2232

## 2019-09-01 NOTE — H&P (Signed)
[place] at [facility]   PATIENT NAME: Garrett [name]    MR#:  [id number]  DATE OF BIRTH:  05-07-1985  DATE OF ADMISSION:  09/01/2019  PRIMARY CARE PHYSICIAN: Patient, No Pcp Per   REQUESTING/REFERRING PHYSICIAN: Alfonse Flavors, MD  CHIEF COMPLAINT:   Chief Complaint  Patient presents with  . Diarrhea  . Hyperglycemia    HISTORY OF PRESENT ILLNESS:  Garrett [name]  is a 34 y.o. African-American male with a known history of type 1 diabetes mellitus and C-spine compression fracture, who presented to the emergency room with acute onset of diarrhea with hyperglycemia.  Is been having associated abdominal pain.  Denies any melena or bright red blood per rectum.  No nausea or vomiting.  Is almost running out of his insulin.  He has not been having much appetite over the last couple of days and with fatigue and malaise he has not been taking his insulin.  His blood glucose was checked last time about 3 days ago and it was 350.  No dysuria however is been having mild polyuria and polydipsia.  No hematuria or flank pain.  No cough or wheezing or dyspnea.  No chest pain or palpitations.  Upon presentation to the emergency room, heart rate was 105 with otherwise normal vital signs.  Labs revealed significant hyponatremia 117 and hyperglycemia of 1115 with hypochloremia of 84 BUN of 24 and creatinine 1.73 above previous normal levels, alkaline phosphatase of 245 and albumin 3.1 with total protein 7.8, AST of 35 and ALT of 53 and total bili 1.4 and CBC showed laxatives at 13.3 with anemia close to baseline.  UA showed 6-10 WBCs and more than 500 glucose.  The patient was given 2 L bolus of IV normal saline was started on IV insulin drip per Endo tool.  It was then placed on 75 mL/h of IV normal saline.  He will be admitted to a stepdown unit for further evaluation and management.  PAST MEDICAL HISTORY:   Past Medical History:  Diagnosis Date  . Compression fracture of C-spine  (HCC)   . Type 1 diabetes (HCC)     PAST SURGICAL HISTORY:   Past Surgical History:  Procedure Laterality Date  . AMPUTATION Right 04/10/2019   Procedure: AMPUTATION GREAT RIGHT TOE;  Surgeon: Nadara Mustard, MD;  Location: [facility] OR;  Service: Orthopedics;  Laterality: Right;  . NO PAST SURGERIES      SOCIAL HISTORY:   Social History   Tobacco Use  . Smoking status: Current Every Day Smoker    Packs/day: 0.50    Types: Cigarettes  . Smokeless tobacco: Never Used  Substance Use Topics  . Alcohol use: Yes    Comment: occasionally    FAMILY HISTORY:  Positive for diabetes mellitus in his grandmother.  DRUG ALLERGIES:  No Known Allergies  REVIEW OF SYSTEMS:   ROS As per history of present illness. All pertinent systems were reviewed above. Constitutional, HEENT, cardiovascular, respiratory, GI, GU, musculoskeletal, neuro, psychiatric, endocrine, integumentary and hematologic systems were reviewed and are otherwise negative/unremarkable except for positive findings mentioned above in the HPI.   MEDICATIONS AT HOME:   Prior to Admission medications   Medication Sig Start Date End Date Taking? Authorizing Provider  insulin NPH-regular Human (NOVOLIN 70/30) (70-30) 100 UNIT/ML injection Inject 20 Units into the skin See admin instructions. Inject 30 units into the skin before breakfast and 30 units at bedtime Patient taking differently: Inject 30 Units into the skin  See admin instructions. Inject 30 units into the skin before breakfast and 30 units at bedtime 04/11/19 05/11/19  Alessandra Bevels, MD  insulin regular (NOVOLIN R) 100 units/mL injection Inject 3-4 Units into the skin See admin instructions. Inject 3-4 units into the skin "as needed" two times a day (with breakfast and dinner) per sliding scale    [provider]  lisinopril (ZESTRIL) 5 MG tablet Take 1 tablet (5 mg total) by mouth daily. 04/11/19   Alessandra Bevels, MD  metoprolol tartrate (LOPRESSOR) 25 MG  tablet Take 0.5 tablets (12.5 mg total) by mouth 2 (two) times daily. 04/11/19   Alessandra Bevels, MD  Multiple Vitamin (MULTIVITAMIN WITH MINERALS) TABS tablet Take 1 tablet by mouth daily. 04/12/19   Alessandra Bevels, MD  oxyCODONE-acetaminophen (PERCOCET/ROXICET) 5-325 MG tablet Take 1 tablet by mouth 2 (two) times daily as needed for severe pain. 05/28/19   Nadara Mustard, MD      VITAL SIGNS:  Blood pressure 125/83, pulse (!) 102, temperature 98.6 F (37 C), temperature source Oral, resp. rate 18, height 5\' 9"  (1.753 m), weight 65.8 kg, SpO2 98 %.  PHYSICAL EXAMINATION:  Physical Exam  GENERAL:  34 y.o.-year-old African-American male patient lying in the bed with no acute distress.  EYES: Pupils equal, round, reactive to light and accommodation. No scleral icterus. Extraocular muscles intact.  HEENT: Head atraumatic, normocephalic. Oropharynx with dry mucous membrane and tongue.  And nasopharynx clear.  NECK:  Supple, no jugular venous distention. No thyroid enlargement, no tenderness.  LUNGS: Normal breath sounds bilaterally, no wheezing, rales,rhonchi or crepitation. No use of accessory muscles of respiration.  CARDIOVASCULAR: Regular rate and rhythm, S1, S2 normal. No murmurs, rubs, or gallops.  ABDOMEN: Soft, nondistended, nontender. Bowel sounds present. No organomegaly or mass.  EXTREMITIES: No pedal edema, cyanosis, or clubbing.  NEUROLOGIC: Cranial nerves II through XII are intact. Muscle strength 5/5 in all extremities. Sensation intact. Gait not checked.  PSYCHIATRIC: The patient is alert and oriented x 3.  Normal affect and good eye contact. SKIN: No obvious rash, lesion, or ulcer.   LABORATORY PANEL:   CBC Recent Labs  Lab 09/01/19 1949  WBC 13.3*  HGB 10.3*  HCT 35.4*  PLT 357   ------------------------------------------------------------------------------------------------------------------  Chemistries  Recent Labs  Lab 09/01/19 1949  NA 117*  K 5.1  CL  84*  CO2 20*  GLUCOSE 1,115*  BUN 24*  CREATININE 1.73*  CALCIUM 8.5*  AST 35  ALT 53*  ALKPHOS 245*  BILITOT 1.4*   ------------------------------------------------------------------------------------------------------------------  Cardiac Enzymes No results for input(s): TROPONINI in the last 168 hours. ------------------------------------------------------------------------------------------------------------------  RADIOLOGY:  No results found.    IMPRESSION AND PLAN:   1.  Uncontrolled type 1 diabetes mellitus with nonketotic hyperosmolar hyperglycemia. -The patient will be admitted to a stepdown unit. -She will be aggressively hydrated with IV normal saline. -We will continue IV insulin drip per Endo tool for an KH protocol. -We will follow serial BMPs. -We will hold off his long-acting insulin.  2.  Acute kidney injury. -This likely secondary to volume depletion and dehydration due to diarrhea. -The patient will be placed on hydration with normal saline as mentioned above. -Nephrotoxins and will be held off. -As needed Imodium will be provided.  3.  Hyponatremia. -This is likely pseudohyponatremia from hyperglycemia. -Management as above with IV insulin drip as well as hydration with IV normal saline.  4.  Hypertension. -We will continue Lopressor and hold off lisinopril given acute kidney injury.  5.  DVT prophylaxis. -Subcutaneous Lovenox.    All the records are reviewed and case discussed with ED provider. The plan of care was discussed in details with the patient (and family). I answered all questions. The patient agreed to proceed with the above mentioned plan. Further management will depend upon hospital course.   CODE STATUS: Full code  Status is: Inpatient  Remains inpatient appropriate because:Persistent severe electrolyte disturbances, Ongoing diagnostic testing needed not appropriate for outpatient work up, Unsafe d/c plan, IV treatments  appropriate due to intensity of illness or inability to take PO and Inpatient level of care appropriate due to severity of illness   Dispo: The patient is from: Home              Anticipated d/c is to: Home              Anticipated d/c date is: 2 days              Patient currently is not medically stable to d/c.   TOTAL TIME TAKING CARE OF THIS PATIENT: 50 minutes.    Hannah Beat M.D on 09/01/2019 at 10:21 PM  [facility]   From 7 PM-7 AM, contact night-coverage [email or web address]  CC: Primary care physician; Patient, No Pcp Per   Note: This dictation was prepared with Dragon dictation along with smaller phrase technology. Any transcriptional typo errors that result from this process are unintentional.

## 2019-09-01 NOTE — ED Triage Notes (Signed)
Patient here stating that he has had frequent stools for 2-3 months. Patient states that he has been seen for it before but they did not find anything. Patient states that he also thinks that his blood sugar is high. Patient states that he has not checked his blood sugar in 2 -3 days because he was out of test strips. Patient states that his blood sugars where fine prior to running out. Patient states that he has still been taking his diabetic medications.

## 2019-09-02 DIAGNOSIS — E1159 Type 2 diabetes mellitus with other circulatory complications: Secondary | ICD-10-CM | POA: Diagnosis not present

## 2019-09-02 DIAGNOSIS — N179 Acute kidney failure, unspecified: Secondary | ICD-10-CM | POA: Diagnosis not present

## 2019-09-02 DIAGNOSIS — I10 Essential (primary) hypertension: Secondary | ICD-10-CM

## 2019-09-02 DIAGNOSIS — E1065 Type 1 diabetes mellitus with hyperglycemia: Secondary | ICD-10-CM | POA: Diagnosis not present

## 2019-09-02 LAB — GLUCOSE, CAPILLARY
Glucose-Capillary: 133 mg/dL — ABNORMAL HIGH (ref 70–99)
Glucose-Capillary: 134 mg/dL — ABNORMAL HIGH (ref 70–99)
Glucose-Capillary: 138 mg/dL — ABNORMAL HIGH (ref 70–99)
Glucose-Capillary: 178 mg/dL — ABNORMAL HIGH (ref 70–99)
Glucose-Capillary: 232 mg/dL — ABNORMAL HIGH (ref 70–99)
Glucose-Capillary: 234 mg/dL — ABNORMAL HIGH (ref 70–99)
Glucose-Capillary: 328 mg/dL — ABNORMAL HIGH (ref 70–99)
Glucose-Capillary: 374 mg/dL — ABNORMAL HIGH (ref 70–99)
Glucose-Capillary: 439 mg/dL — ABNORMAL HIGH (ref 70–99)
Glucose-Capillary: 541 mg/dL (ref 70–99)
Glucose-Capillary: 580 mg/dL (ref 70–99)
Glucose-Capillary: 66 mg/dL — ABNORMAL LOW (ref 70–99)
Glucose-Capillary: 67 mg/dL — ABNORMAL LOW (ref 70–99)
Glucose-Capillary: 85 mg/dL (ref 70–99)

## 2019-09-02 LAB — BASIC METABOLIC PANEL
Anion gap: 15 (ref 5–15)
Anion gap: 7 (ref 5–15)
BUN: 20 mg/dL (ref 6–20)
BUN: 24 mg/dL — ABNORMAL HIGH (ref 6–20)
CO2: 17 mmol/L — ABNORMAL LOW (ref 22–32)
CO2: 23 mmol/L (ref 22–32)
Calcium: 8.2 mg/dL — ABNORMAL LOW (ref 8.9–10.3)
Calcium: 8.4 mg/dL — ABNORMAL LOW (ref 8.9–10.3)
Chloride: 104 mmol/L (ref 98–111)
Chloride: 94 mmol/L — ABNORMAL LOW (ref 98–111)
Creatinine, Ser: 0.97 mg/dL (ref 0.61–1.24)
Creatinine, Ser: 1.52 mg/dL — ABNORMAL HIGH (ref 0.61–1.24)
GFR calc Af Amer: >60 mL/min (ref >60)
GFR calc Af Amer: >60 mL/min (ref >60)
GFR calc non Af Amer: 58 mL/min — ABNORMAL LOW (ref >60)
GFR calc non Af Amer: >60 mL/min (ref >60)
Glucose, Bld: 143 mg/dL — ABNORMAL HIGH (ref 70–99)
Glucose, Bld: 735 mg/dL (ref 70–99)
Potassium: 3.5 mmol/L (ref 3.5–5.1)
Potassium: 3.6 mmol/L (ref 3.5–5.1)
Sodium: 126 mmol/L — ABNORMAL LOW (ref 135–145)
Sodium: 134 mmol/L — ABNORMAL LOW (ref 135–145)

## 2019-09-02 LAB — MRSA PCR SCREENING: MRSA by PCR: NEGATIVE

## 2019-09-02 LAB — GASTROINTESTINAL PANEL BY PCR, STOOL (REPLACES STOOL CULTURE)

## 2019-09-02 LAB — HEMOGLOBIN A1C
Hgb A1c MFr Bld: 13.2 % — ABNORMAL HIGH (ref 4.8–5.6)
Mean Plasma Glucose: 332.14 mg/dL

## 2019-09-02 LAB — C DIFFICILE QUICK SCREEN W PCR REFLEX
C Diff antigen: NEGATIVE
C Diff interpretation: NOT DETECTED
C Diff toxin: NEGATIVE

## 2019-09-02 LAB — HIV ANTIBODY (ROUTINE TESTING W REFLEX): HIV Screen 4th Generation wRfx: NONREACTIVE

## 2019-09-02 MED ORDER — INSULIN ASPART 100 UNIT/ML ~~LOC~~ SOLN: 4.0000 [IU] | Freq: Three times a day (TID) | SUBCUTANEOUS | Status: DC

## 2019-09-02 MED ORDER — INSULIN GLARGINE 100 UNIT/ML ~~LOC~~ SOLN
20.0000 [IU] | SUBCUTANEOUS | Status: DC
  Filled 2019-09-02: qty 0.2

## 2019-09-02 MED ORDER — INSULIN ASPART 100 UNIT/ML ~~LOC~~ SOLN
0.0000 [IU] | Freq: Three times a day (TID) | SUBCUTANEOUS | Status: DC
  Administered 2019-09-02: 11 [IU] via SUBCUTANEOUS
  Filled 2019-09-02: qty 1

## 2019-09-02 MED ORDER — INSULIN GLARGINE 100 UNIT/ML ~~LOC~~ SOLN
20.0000 [IU] | SUBCUTANEOUS | Status: DC
  Administered 2019-09-02: 20 [IU] via SUBCUTANEOUS
  Filled 2019-09-02: qty 0.2

## 2019-09-02 MED ORDER — PSYLLIUM 95 % PO PACK
1.0000 | PACK | Freq: Every day | ORAL | Status: DC
  Filled 2019-09-02 (×3): qty 1

## 2019-09-02 MED ORDER — INSULIN ASPART 100 UNIT/ML ~~LOC~~ SOLN
0.0000 [IU] | SUBCUTANEOUS | Status: DC
  Administered 2019-09-03: 11 [IU] via SUBCUTANEOUS
  Filled 2019-09-02: qty 1

## 2019-09-02 MED ORDER — INSULIN ASPART 100 UNIT/ML ~~LOC~~ SOLN: 5.0000 [IU] | Freq: Three times a day (TID) | SUBCUTANEOUS | Status: DC

## 2019-09-02 MED ORDER — INSULIN ASPART 100 UNIT/ML ~~LOC~~ SOLN
3.0000 [IU] | Freq: Three times a day (TID) | SUBCUTANEOUS | Status: DC
  Administered 2019-09-02: 3 [IU] via SUBCUTANEOUS
  Filled 2019-09-02: qty 1

## 2019-09-02 MED ORDER — DIPHENOXYLATE-ATROPINE 2.5-0.025 MG PO TABS
1.0000 | ORAL_TABLET | Freq: Four times a day (QID) | ORAL | Status: DC | PRN
  Administered 2019-09-02: 1 via ORAL
  Filled 2019-09-02: qty 1

## 2019-09-02 MED ORDER — INSULIN ASPART 100 UNIT/ML ~~LOC~~ SOLN
20.0000 [IU] | Freq: Once | SUBCUTANEOUS | Status: AC
  Administered 2019-09-02: 20 [IU] via SUBCUTANEOUS
  Filled 2019-09-02: qty 1

## 2019-09-02 MED ORDER — INSULIN GLARGINE 100 UNIT/ML ~~LOC~~ SOLN
25.0000 [IU] | SUBCUTANEOUS | Status: DC
  Filled 2019-09-02: qty 0.25

## 2019-09-02 MED ORDER — INSULIN GLARGINE 100 UNIT/ML ~~LOC~~ SOLN
5.0000 [IU] | Freq: Once | SUBCUTANEOUS | Status: DC
  Filled 2019-09-02: qty 0.05

## 2019-09-02 MED ORDER — SODIUM CHLORIDE 0.9 % IV SOLN: INTRAVENOUS | Status: DC

## 2019-09-02 NOTE — Progress Notes (Signed)
2130 glucose >500, B. Jon Billings, NP notified, one time dose novolog ordered, ok to transfer to med/surg floor. Report called to 1A nurse

## 2019-09-02 NOTE — Progress Notes (Addendum)
PROGRESS NOTE    Garrett [name]  DJM:[id number] DOB: 07/02/1985 DOA: 09/01/2019 PCP: Patient, No Pcp Per   Brief Narrative:  34 y.o. African-American male with a known history of type 1 diabetes mellitus and C-spine compression fracture, who presented to the emergency room with acute onset of diarrhea with hyperglycemia.  Is been having associated abdominal pain.  Denies any melena or bright red blood per rectum.  No nausea or vomiting.  Is almost running out of his insulin.  He has not been having much appetite over the last couple of days and with fatigue and malaise he has not been taking his insulin.  His blood glucose was checked last time about 3 days ago and it was 350.  No dysuria however is been having mild polyuria and polydipsia.  No hematuria or flank pain.  No cough or wheezing or dyspnea.  No chest pain or palpitations.  Upon presentation to the emergency room, heart rate was 105 with otherwise normal vital signs.  Labs revealed significant hyponatremia 117 and hyperglycemia of 1115 with hypochloremia of 84 BUN of 24 and creatinine 1.73 above previous normal levels, alkaline phosphatase of 245 and albumin 3.1 with total protein 7.8, AST of 35 and ALT of 53 and total bili 1.4 and CBC showed laxatives at 13.3 with anemia close to baseline.  UA showed 6-10 WBCs and more than 500 glucose.  7/19: HHS resolved.  Glycemic control improved.  Patient titrated off drip today and switch to subcutaneous insulin regimen.  Unclear where this patient has been getting his medications.  He states he just goes to [place] and picks them up but he has not seen a primary care physician or any other supervising physician in quite a while   Assessment & Plan:   Active Problems:   Type 2 diabetes mellitus with hyperosmolar hyperglycemic state (HHS) (HCC)  Uncontrolled type 1 diabetes mellitus with nonketotic hyperosmolar hyperglycemia Glycemic control improved Drip weaned off Plan: Subcutaneous insulin  regimen Lantus 25 units daily, NovoLog 5 units 3 times daily with meals Sliding scale coverage Daily BMP Carb modified diet Diabetes coordinator consult TOC consult to establish PCP  Acute kidney injury Likely secondary to volume depletion Patient with acute on chronic diarrhea Suspect the diarrhea secondary to poorly controlled hyperglycemia Hydrate Avoid nephrotoxins As needed Imodium  Hyponatremia Likely driven by pseudohyponatremia secondary hyperglycemia Improving over interval  Hypertension Hold lisinopril Continue Lopressor    DVT prophylaxis: Lovenox Code Status: Full Family Communication: None today Disposition Plan: Status is: Inpatient  Remains inpatient appropriate because:Inpatient level of care appropriate due to severity of illness   Dispo: The patient is from: Home              Anticipated d/c is to: Home              Anticipated d/c date is: 1 day              Patient currently is not medically stable to d/c.  Resolving HHS.  Transitioned subcutaneous insulin regimen today.  If patient remained stable anticipate discharge home on 09/03/2019      Consultants:   None  Procedures:   None  Antimicrobials:   None   Subjective: Examined.  Sleeping but easily awakened.  No complaints  Objective: Vitals:   09/01/19 2309 09/01/19 2332 09/02/19 0341 09/02/19 0800  BP:   113/77 117/74  Pulse: 88   81  Resp:    18  Temp:   98.5 F (36.9  C) 98.2 F (36.8 C)  TempSrc:   Oral Oral  SpO2: 100%  100% 100%  Weight:  69.8 kg    Height:  5\' 10"  (1.778 m)      Intake/Output Summary (Last 24 hours) at 09/02/2019 1414 Last data filed at 09/02/2019 0400 Gross per 24 hour  Intake 1386.49 ml  Output 600 ml  Net 786.49 ml   Filed Weights   09/01/19 1939 09/01/19 2332  Weight: 65.8 kg 69.8 kg    Examination:  General exam: Appears calm and comfortable  Respiratory system: Clear to auscultation. Respiratory effort normal. Cardiovascular  system: S1 & S2 heard, RRR. No JVD, murmurs, rubs, gallops or clicks. No pedal edema. Gastrointestinal system: Abdomen is nondistended, soft and nontender. No organomegaly or masses felt. Normal bowel sounds heard. Central nervous system: Alert and oriented. No focal neurological deficits. Extremities: Symmetric 5 x 5 power. Skin: No rashes, lesions or ulcers Psychiatry: Judgement and insight appear normal. Mood & affect appropriate.     Data Reviewed: I have personally reviewed following labs and imaging studies  CBC: Recent Labs  Lab 09/01/19 1949  WBC 13.3*  HGB 10.3*  HCT 35.4*  MCV 104.7*  PLT 357   Basic Metabolic Panel: Recent Labs  Lab 09/01/19 1949 09/01/19 2340 09/02/19 0417  NA 117* 126* 134*  K 5.1 3.6 3.5  CL 84* 94* 104  CO2 20* 17* 23  GLUCOSE 1,115* 735* 143*  BUN 24* 24* 20  CREATININE 1.73* 1.52* 0.97  CALCIUM 8.5* 8.2* 8.4*   GFR: Estimated Creatinine Clearance: 103.9 mL/min (by C-G formula based on SCr of 0.97 mg/dL). Liver Function Tests: Recent Labs  Lab 09/01/19 1949  AST 35  ALT 53*  ALKPHOS 245*  BILITOT 1.4*  PROT 7.8  ALBUMIN 3.1*   No results for input(s): LIPASE, AMYLASE in the last 168 hours. No results for input(s): AMMONIA in the last 168 hours. Coagulation Profile: No results for input(s): INR, PROTIME in the last 168 hours. Cardiac Enzymes: No results for input(s): CKTOTAL, CKMB, CKMBINDEX, TROPONINI in the last 168 hours. BNP (last 3 results) No results for input(s): PROBNP in the last 8760 hours. HbA1C: Recent Labs    09/01/19 2340  HGBA1C 13.2*   CBG: Recent Labs  Lab 09/02/19 0434 09/02/19 0530 09/02/19 0632 09/02/19 1220 09/02/19 1249  GLUCAP 133* 134* 138* 234* 328*   Lipid Profile: No results for input(s): CHOL, HDL, LDLCALC, TRIG, CHOLHDL, LDLDIRECT in the last 72 hours. Thyroid Function Tests: No results for input(s): TSH, T4TOTAL, FREET4, T3FREE, THYROIDAB in the last 72 hours. Anemia Panel: No  results for input(s): VITAMINB12, FOLATE, FERRITIN, TIBC, IRON, RETICCTPCT in the last 72 hours. Sepsis Labs: No results for input(s): PROCALCITON, LATICACIDVEN in the last 168 hours.  Recent Results (from the past 240 hour(s))  Gastrointestinal Panel by PCR , Stool     Status: None   Collection Time: 09/01/19  8:57 PM   Specimen: Stool  Result Value Ref Range Status   Campylobacter species NOT DETECTED NOT DETECTED Final   Plesimonas shigelloides NOT DETECTED NOT DETECTED Final   Salmonella species NOT DETECTED NOT DETECTED Final   Yersinia enterocolitica NOT DETECTED NOT DETECTED Final   Vibrio species NOT DETECTED NOT DETECTED Final   Vibrio cholerae NOT DETECTED NOT DETECTED Final   Enteroaggregative E coli (EAEC) NOT DETECTED NOT DETECTED Final   Enteropathogenic E coli (EPEC) NOT DETECTED NOT DETECTED Final   Enterotoxigenic E coli (ETEC) NOT DETECTED NOT DETECTED Final  Shiga like toxin producing E coli (STEC) NOT DETECTED NOT DETECTED Final   Shigella/Enteroinvasive E coli (EIEC) NOT DETECTED NOT DETECTED Final   Cryptosporidium NOT DETECTED NOT DETECTED Final   Cyclospora cayetanensis NOT DETECTED NOT DETECTED Final   Entamoeba histolytica NOT DETECTED NOT DETECTED Final   Giardia lamblia NOT DETECTED NOT DETECTED Final   Adenovirus F40/41 NOT DETECTED NOT DETECTED Final   Astrovirus NOT DETECTED NOT DETECTED Final   Norovirus GI/GII NOT DETECTED NOT DETECTED Final   Rotavirus A NOT DETECTED NOT DETECTED Final   Sapovirus (I, II, IV, and V) NOT DETECTED NOT DETECTED Final    Comment: Performed at [facility], [address] [place], [place] [address]  C Difficile Quick Screen w PCR reflex     Status: None   Collection Time: 09/01/19  8:57 PM   Specimen: Stool  Result Value Ref Range Status   C Diff antigen NEGATIVE NEGATIVE Final   C Diff toxin NEGATIVE NEGATIVE Final   C Diff interpretation No C. difficile detected.  Final    Comment: Performed at [facility], [address] [place], [place] [address]  SARS Coronavirus 2 by RT PCR (hospital order, performed in [facility] hospital lab) Nasopharyngeal Nasopharyngeal Swab     Status: None   Collection Time: 09/01/19 10:12 PM   Specimen: Nasopharyngeal Swab  Result Value Ref Range Status   SARS Coronavirus 2 NEGATIVE NEGATIVE Final    Comment: (NOTE) SARS-CoV-2 target nucleic acids are NOT DETECTED.  The SARS-CoV-2 RNA is generally detectable in upper and lower respiratory specimens during the acute phase of infection. The lowest concentration of SARS-CoV-2 viral copies this assay can detect is 250 copies / mL. A negative result does not preclude SARS-CoV-2 infection and should not be used as the sole basis for treatment or other patient management decisions.  A negative result may occur with improper specimen collection / handling, submission of specimen other than nasopharyngeal swab, presence of viral mutation(s) within the areas targeted by this assay, and inadequate number of viral copies (<250 copies / mL). A negative result must be combined with clinical observations, patient history, and epidemiological information.  Fact Sheet for Patients:   BoilerBrush.com.cy  Fact Sheet for Healthcare Providers: https://pope.com/  This test is not yet approved or  cleared by the [place] FDA and has been authorized for detection and/or diagnosis of SARS-CoV-2 by FDA under an Emergency Use Authorization (EUA).  This EUA will remain in effect (meaning this test can be used) for the duration of the COVID-19 declaration under Section 564(b)(1) of the Act, 21 U.S.C. section 360bbb-3(b)(1), unless the authorization is terminated or revoked sooner.  Performed at [facility], [address] [place], [place] [address]   MRSA PCR Screening     Status: None   Collection Time: 09/01/19 11:50 PM   Specimen: Nasopharyngeal    Result Value Ref Range Status   MRSA by PCR NEGATIVE NEGATIVE Final    Comment:        The GeneXpert MRSA Assay (FDA approved for NASAL specimens only), is one component of a comprehensive MRSA colonization surveillance program. It is not intended to diagnose MRSA infection nor to guide or monitor treatment for MRSA infections. Performed at [facility], [address], [place], [place] [address]          Radiology Studies: DG Chest Portable 1 View  Result Date: 09/01/2019 CLINICAL DATA:  Fatigue hypercalcemia EXAM: PORTABLE CHEST 1 VIEW  COMPARISON:  None. FINDINGS: The heart size and mediastinal contours are within normal limits. Both lungs are clear. The visualized skeletal structures are unremarkable. IMPRESSION: No active disease. Electronically Signed   By: Jonna Clark M.D.   On: 09/01/2019 22:41        Scheduled Meds: . Chlorhexidine Gluconate Cloth  6 each Topical Daily  . enoxaparin (LOVENOX) injection  40 mg Subcutaneous Q24H  . insulin aspart  0-15 Units Subcutaneous TID AC & HS  . insulin aspart  5 Units Subcutaneous TID WC  . [START ON 09/03/2019] insulin glargine  25 Units Subcutaneous Q24H  . insulin glargine  5 Units Subcutaneous Once  . metoprolol tartrate  12.5 mg Oral BID  . multivitamin with minerals  1 tablet Oral Daily   Continuous Infusions: . sodium chloride 100 mL/hr at 09/02/19 1019  . dextrose 5 % and 0.45% NaCl Stopped (09/02/19 0352)  . insulin Stopped (09/02/19 1011)     LOS: 1 day    Time spent: 25 minutes    Tresa Moore, MD [facility] Pager [phone]  If 7PM-7AM, please contact night-coverage 09/02/2019, 2:14 PM

## 2019-09-02 NOTE — Progress Notes (Signed)
Inpatient Diabetes Program Recommendations  AACE/ADA: New Consensus Statement on Inpatient Glycemic Control (2015)  Target Ranges:  Prepandial:   less than 140 mg/dL      Peak postprandial:   less than 180 mg/dL (1-2 hours)      Critically ill patients:  140 - 180 mg/dL   Lab Results  Component Value Date   GLUCAP 138 (H) 09/02/2019   HGBA1C 13.2 (H) 09/01/2019    Review of Glycemic Control Results for Garrett [name], Garrett [name] (MRN [id number]) as of 09/02/2019 11:42  Ref. Range 09/02/2019 02:32 09/02/2019 03:33 09/02/2019 04:34 09/02/2019 05:30 09/02/2019 06:32  Glucose-Capillary Latest Ref Range: 70 - 99 mg/dL 387 (H) 564 (H) 332 (H) 134 (H) 138 (H)   Diabetes history: DM 1 Outpatient Diabetes medications: Novolin 70/30 30 units bid Current orders for Inpatient glycemic control:  Lantus 20 units daily, Novolog moderate tid with meals, Novolog 3 units tid with meals.    Inpatient Diabetes Program Recommendations:    Agree with current orders.  I saw patient back in February of 2021. We discussed site rotation of insulin administration at that visit.  Patient currently buys his insulin and diabetes supplies from [facility] (70/30 is 25$ a vial and strips are 9$ for 50 strips).  He states that he was feeling badly prior to admit (like he couldn't move) and states he was taking insulin until he started feeling bad.  Discussed A1C results and he recognized that it was "better" however still not at goal.  He reports that blood sugars are mostly in the 300's at home.  He does state that he takes 70/30 as prescribed, however A1C is still really high. Needs PCP to assist with management of DM and also help with outpatient adjustment of meds.   Patient states that he needs someone to help him "get medicaid" and a physician. TOC referral in place. It appears during last hospitalization, he had TOC consult and CM noted that patient does have [place] Medicaid??  Therefore he was not a candidate for MATCH.   -At discharge,  he will need to be restarted on 70/30 regimen due to cost.  Consider d/c of Lantus and restart 70/30 20 units bid on 09/03/19.   Thanks,  Beryl Meager, RN, BC-ADM Inpatient Diabetes Coordinator Pager [phone]

## 2019-09-03 DIAGNOSIS — E1159 Type 2 diabetes mellitus with other circulatory complications: Secondary | ICD-10-CM | POA: Diagnosis not present

## 2019-09-03 DIAGNOSIS — N179 Acute kidney failure, unspecified: Secondary | ICD-10-CM | POA: Diagnosis not present

## 2019-09-03 DIAGNOSIS — E1065 Type 1 diabetes mellitus with hyperglycemia: Secondary | ICD-10-CM | POA: Diagnosis not present

## 2019-09-03 DIAGNOSIS — I10 Essential (primary) hypertension: Secondary | ICD-10-CM | POA: Diagnosis not present

## 2019-09-03 LAB — BASIC METABOLIC PANEL
Anion gap: 5 (ref 5–15)
BUN: 17 mg/dL (ref 6–20)
CO2: 24 mmol/L (ref 22–32)
Calcium: 8.5 mg/dL — ABNORMAL LOW (ref 8.9–10.3)
Chloride: 102 mmol/L (ref 98–111)
Creatinine, Ser: 0.81 mg/dL (ref 0.61–1.24)
GFR calc Af Amer: >60 mL/min (ref >60)
GFR calc non Af Amer: >60 mL/min (ref >60)
Glucose, Bld: 193 mg/dL — ABNORMAL HIGH (ref 70–99)
Potassium: 4.2 mmol/L (ref 3.5–5.1)
Sodium: 131 mmol/L — ABNORMAL LOW (ref 135–145)

## 2019-09-03 LAB — GLUCOSE, CAPILLARY
Glucose-Capillary: 70 mg/dL (ref 70–99)
Glucose-Capillary: 252 mg/dL — ABNORMAL HIGH (ref 70–99)
Glucose-Capillary: 90 mg/dL (ref 70–99)
Glucose-Capillary: 190 mg/dL — ABNORMAL HIGH (ref 70–99)
Glucose-Capillary: 425 mg/dL — ABNORMAL HIGH (ref 70–99)
Glucose-Capillary: 189 mg/dL — ABNORMAL HIGH (ref 70–99)

## 2019-09-03 MED ORDER — PSYLLIUM 95 % PO PACK: 1.0000 | PACK | Freq: Every day | ORAL | 0 refills | Status: AC

## 2019-09-03 MED ORDER — INSULIN ASPART 100 UNIT/ML ~~LOC~~ SOLN
6.0000 [IU] | Freq: Once | SUBCUTANEOUS | Status: AC
  Administered 2019-09-03: 6 [IU] via SUBCUTANEOUS
  Filled 2019-09-03: qty 1

## 2019-09-03 MED ORDER — BLOOD GLUCOSE METER KIT: PACK | 0 refills | Status: AC

## 2019-09-03 MED ORDER — INSULIN ASPART PROT & ASPART (70-30 MIX) 100 UNIT/ML ~~LOC~~ SUSP
10.0000 [IU] | Freq: Once | SUBCUTANEOUS | Status: AC
  Administered 2019-09-03: 10 [IU] via SUBCUTANEOUS
  Filled 2019-09-03: qty 10

## 2019-09-03 MED ORDER — INSULIN ASPART PROT & ASPART (70-30 MIX) 100 UNIT/ML ~~LOC~~ SUSP
20.0000 [IU] | Freq: Two times a day (BID) | SUBCUTANEOUS | Status: DC
  Administered 2019-09-03: 20 [IU] via SUBCUTANEOUS
  Filled 2019-09-03 (×2): qty 10

## 2019-09-03 MED ORDER — NOVOLIN 70/30 (70-30) 100 UNIT/ML ~~LOC~~ SUSP: 20.0000 [IU] | Freq: Two times a day (BID) | SUBCUTANEOUS | 0 refills | Status: AC

## 2019-09-03 MED ORDER — LISINOPRIL 5 MG PO TABS
5.0000 mg | ORAL_TABLET | Freq: Every day | ORAL | Status: DC
  Administered 2019-09-03: 5 mg via ORAL
  Filled 2019-09-03: qty 1

## 2019-09-03 MED ORDER — INSULIN ASPART 100 UNIT/ML ~~LOC~~ SOLN
0.0000 [IU] | Freq: Three times a day (TID) | SUBCUTANEOUS | Status: DC
  Administered 2019-09-03: 2 [IU] via SUBCUTANEOUS
  Filled 2019-09-03: qty 1

## 2019-09-03 MED ORDER — DIPHENOXYLATE-ATROPINE 2.5-0.025 MG PO TABS: 1.0000 | ORAL_TABLET | Freq: Four times a day (QID) | ORAL | 0 refills | Status: AC | PRN

## 2019-09-03 MED ORDER — METOPROLOL TARTRATE 25 MG PO TABS: 12.5000 mg | ORAL_TABLET | Freq: Two times a day (BID) | ORAL | 0 refills | Status: AC

## 2019-09-03 MED ORDER — LISINOPRIL 5 MG PO TABS: 5.0000 mg | ORAL_TABLET | Freq: Every day | ORAL | 0 refills | Status: AC

## 2019-09-03 NOTE — Progress Notes (Addendum)
Inpatient Diabetes Program Recommendations  AACE/ADA: New Consensus Statement on Inpatient Glycemic Control (2015)  Target Ranges:  Prepandial:   less than 140 mg/dL      Peak postprandial:   less than 180 mg/dL (1-2 hours)      Critically ill patients:  140 - 180 mg/dL   Lab Results  Component Value Date   GLUCAP 190 (H) 09/03/2019   HGBA1C 13.2 (H) 09/01/2019    Review of Glycemic Control Results for Garrett [name], Garrett [name] (MRN [id number]) as of 09/03/2019 12:03  Ref. Range 09/03/2019 02:36 09/03/2019 06:24 09/03/2019 08:24 09/03/2019 10:59  Glucose-Capillary Latest Ref Range: 70 - 99 mg/dL 70 203 (H) 90 559 (H)  Diabetes history: DM 1 Outpatient Diabetes medications: Novolin 70/30 30 units bid, Novolin R 3-4 units bid Current orders for Inpatient glycemic control:  Novolog sensitive tid with meals Novolog 70/30 mix 20 units bid Inpatient Diabetes Program Recommendations:    Patient did not get 70/30 this morning due to low blood sugar.   Please consider ordering one time dose of 70/30 10 units. Orders received from Dr. Georgeann Oppenheim.  Per care manager, he is not eligible for assistance due to having out of state medicaid.  Will need to purchase his insulin from [facility] along with his strips. Will discuss with patient.   Thanks  Beryl Meager, RN, BC-ADM Inpatient Diabetes Coordinator Pager [phone] (8a-5p)  Addendum:  Spoke with patient by phone and explained that he would need to purchase insulin and supplies from [facility].  Encouraged him to work on getting Medicaid switched from [facility] to [facility].  Patient states he is very low on funds b/c he is unable to work. Will ask care manager if patient is eligible for assistance from medication management and financial counselor (for switching of Medicaid).

## 2019-09-03 NOTE — TOC Transition Note (Signed)
Transition of Care (TOC) - CM/SW Discharge Note   Patient Details  Name: Garrett [name] MRN: [id number] Date of Birth: 08/25/1985  Transition of Care (TOC) CM/SW Contact:  Dupree, Rebecca G, LCSW Phone Number: 09/03/2019, 2:58 PM   Clinical Narrative:   Met with pt to discuss he needs to re-apply for Medicaid in [place] since his medicaid is in [place]. He may not be eligible here since he is no longer living with his minor child. He has been here since 01/2017 living with a friend. He needs to apply for Medicaid and SSI and explained to him how to do this. Since he has insurance listed he is not eligible for [facility] or medication assistance. Have given him the four clinics here in [place] that take pt with sliding scale fees so if he can get connected to one of these he cold get assist with medications. He has been getting insulin through [facility]. He has a BS machine but no strips. His roommate transports him and works. Made RN, Diabetic coordinator and MD aware of this. Pt aware will be discharged today. Hopefully he will follow up on getting a clinic appointment.      Barriers to Discharge: Inadequate or no insurance   Patient Goals and CMS Choice Patient states their goals for this hospitalization and ongoing recovery are:: I need some help with my diabetes supplies and insulin      Discharge Placement                       Discharge Plan and Services In-house Referral: Clinical Social Work   Post Acute Care Choice: NA                               Social Determinants of Health (SDOH) Interventions     Readmission Risk Interventions No flowsheet data found.     

## 2019-09-03 NOTE — Discharge Summary (Signed)
Physician Discharge Summary  Jeremy Ditullio UVO:[id number] DOB: 13-May-1985 DOA: 09/01/2019  PCP: Patient, No Pcp Per  Admit date: 09/01/2019 Discharge date: 09/03/2019  Admitted From: Home Disposition:  Home  Recommendations for Outpatient Follow-up:  1. Follow up with PCP in 1-2 weeks 2.   Home Health:No Equipment/Devices:None Discharge Condition:Stable CODE STATUS:Full Diet recommendation: Heart Healthy / Carb Modified Brief/Interim Summary: 34 y.o.African-American malewith a known history of type 1 diabetes mellitus and C-spine compression fracture, who presented to the emergency room with acute onset ofdiarrhea with hyperglycemia. Is been having associated abdominal pain. Denies any melena or bright red blood per rectum. No nausea or vomiting. Is almost running out of his insulin. He has not been having much appetite over the last couple of days and with fatigue and malaise he has not been taking his insulin. His blood glucose was checked last time about 3 days ago and it was 350. No dysuria however is been having mild polyuria and polydipsia. No hematuria or flank pain. No cough or wheezing or dyspnea. No chest pain or palpitations.  Upon presentation to the emergency room, heart rate was 105 with otherwise normal vital signs. Labs revealed significant hyponatremia 117 and hyperglycemia of 1115 with hypochloremia of 84 BUN of 24 and creatinine 1.73 above previous normal levels, alkaline phosphatase of 245 and albumin 3.1 with total protein 7.8, AST of 35 and ALT of 53 and total bili 1.4 and CBC showed laxatives at 13.3 with anemia close to baseline. UA showed 6-10 WBCs and more than 500 glucose.  7/19: HHS resolved.  Glycemic control improved.  Patient titrated off drip today and switch to subcutaneous insulin regimen.  Unclear where this patient has been getting his medications.  He states he just goes to [place] and picks them up but he has not seen a primary care physician  or any other supervising physician in quite a while  7/20: Patient seen and examined at bedside.  When a very long conversation about his need for good glucose control and insulin compliance.  The likely nature of his chronic diarrhea secondary to very poorly controlled diabetes.  Hemoglobin A1c approximately 14.  The patient does not have a primary care physician unfortunately.  He has [place] and is not transitioning to [place] despite being here since 2018.  As such she is unable to set up with a primary care physician.  He gets his medications from [facility] without a doctor's prescription.  I had the case manager and the diabetes coordinator see him and provide education and possible resources.  Their involvement is greatly appreciated.  In order for this patient to get better control of his diabetes to avoid future hospitalizations he needs to update his Medicaid to [place] and set himself up with a primary care physician.  Unfortunately until he transitions his Medicaid we are unable to help him in this regard.  I have sent prescriptions for all needed insulin supplies, his hypertensives that he has not been taking, symptomatic treatment for chronic diarrhea over to his [place].  I sincerely hope he establishes with a primary care physician in the area.  Discharge Diagnoses:  Active Problems:   Hyperglycemia due to type 1 diabetes mellitus ([place])   AKI (acute kidney injury) ([place])   Hypertension associated with diabetes ([place])  Uncontrolled type 1 diabetes mellitus with nonketotic hyperosmolar hyperglycemia Glycemic control improved Drip weaned off Plan: Can continue home regimen on discharge.  70/30 insulin at twice daily dosing.  Prescription sent to outpatient pharmacy.  Also prescription for glucometer and test strips sent to patient floor.  Patient needs to set himself up with a PCP.  He needs to transition care over to [facility] prior to  doing so.  I have told him about the importance of this.  He expresses understanding.  I sincerely hope he follows through otherwise he will remain a high risk for readmission  Acute kidney injury Likely secondary to volume depletion Patient with acute on chronic diarrhea Suspect the diarrhea secondary to poorly controlled hyperglycemia Hydrate Avoid nephrotoxins As needed Lomotil, prescribed on discharge Daily psyllium, prescribed on discharge  Hyponatremia Likely driven by pseudohyponatremia secondary hyperglycemia Improving over interval  Hypertension Can continue lisinopril and Lopressor on discharge.  Prescription sent to outpatient pharmacy   Discharge Instructions  Discharge Instructions    Diet - low sodium heart healthy   Complete by: As directed    Increase activity slowly   Complete by: As directed    No wound care   Complete by: As directed      Allergies as of 09/03/2019   No Known Allergies     Medication List    STOP taking these medications   multivitamin with minerals Tabs tablet     TAKE these medications   blood glucose meter kit and supplies Dispense based on patient and insurance preference. Use up to four times daily as directed. (FOR ICD-10 E10.9, E11.9).   diphenoxylate-atropine 2.5-0.025 MG tablet Commonly known as: LOMOTIL Take 1 tablet by mouth 4 (four) times daily as needed for diarrhea or loose stools.   lisinopril 5 MG tablet Commonly known as: ZESTRIL Take 1 tablet (5 mg total) by mouth daily.   metoprolol tartrate 25 MG tablet Commonly known as: LOPRESSOR Take 0.5 tablets (12.5 mg total) by mouth 2 (two) times daily.   NovoLIN 70/30 (70-30) 100 UNIT/ML injection Generic drug: insulin NPH-regular Human Inject 20 Units into the skin 2 (two) times daily with a meal. Inject 30 units into the skin before breakfast and 30 units at bedtime What changed: when to take this   NovoLIN R 100 units/mL injection Generic drug: insulin  regular Inject 3-4 Units into the skin See admin instructions. Inject 3-4 units into the skin "as needed" two times a day (with breakfast and dinner) per sliding scale   psyllium 95 % Pack Commonly known as: HYDROCIL/METAMUCIL Take 1 packet by mouth daily. Start taking on: September 04, 2019       [place], [place], NP Follow up.   Why: Call to get appointment Contact information: [place] [place] [address] [phone]              No Known Allergies  Consultations:  None   Procedures/Studies: DG Chest Portable 1 View  Result Date: 09/01/2019 CLINICAL DATA:  Fatigue hypercalcemia EXAM: PORTABLE CHEST 1 VIEW COMPARISON:  None. FINDINGS: The heart size and mediastinal contours are within normal limits. Both lungs are clear. The visualized skeletal structures are unremarkable. IMPRESSION: No active disease. Electronically Signed   By: Prudencio Pair M.D.   On: 09/01/2019 22:41    (Echo, Carotid, EGD, Colonoscopy, ERCP)    Subjective: Seen and examined the day of discharge.  Flattened affect but expresses understanding of all discharge instructions.  Discharge Exam: Vitals:   09/03/19 0730 09/03/19 1210  BP: (!) 139/96 118/77  Pulse: 82 93  Resp: 16 17  Temp:  98.7  F (37.1 C)  SpO2: 100% 100%   Vitals:   09/02/19 2219 09/03/19 0508 09/03/19 0730 09/03/19 1210  BP: (!) 134/95 (!) 146/103 (!) 139/96 118/77  Pulse: 88 85 82 93  Resp: '17 18 16 17  '$ Temp: 98.2 F (36.8 C) 98.3 F (36.8 C)  98.7 F (37.1 C)  TempSrc: Oral   Oral  SpO2: 100% 100% 100% 100%  Weight:      Height:        General: Pt is alert, awake, not in acute distress Cardiovascular: RRR, S1/S2 +, no rubs, no gallops Respiratory: CTA bilaterally, no wheezing, no rhonchi Abdominal: Soft, NT, ND, bowel sounds + Extremities: no edema, no cyanosis    The results of significant diagnostics from this hospitalization (including imaging,  microbiology, ancillary and laboratory) are listed below for reference.     Microbiology: Recent Results (from the past 240 hour(s))  Gastrointestinal Panel by PCR , Stool     Status: None   Collection Time: 09/01/19  8:57 PM   Specimen: Stool  Result Value Ref Range Status   Campylobacter species NOT DETECTED NOT DETECTED Final   Plesimonas shigelloides NOT DETECTED NOT DETECTED Final   Salmonella species NOT DETECTED NOT DETECTED Final   Yersinia enterocolitica NOT DETECTED NOT DETECTED Final   Vibrio species NOT DETECTED NOT DETECTED Final   Vibrio cholerae NOT DETECTED NOT DETECTED Final   Enteroaggregative E coli (EAEC) NOT DETECTED NOT DETECTED Final   Enteropathogenic E coli (EPEC) NOT DETECTED NOT DETECTED Final   Enterotoxigenic E coli (ETEC) NOT DETECTED NOT DETECTED Final   Shiga like toxin producing E coli (STEC) NOT DETECTED NOT DETECTED Final   Shigella/Enteroinvasive E coli (EIEC) NOT DETECTED NOT DETECTED Final   Cryptosporidium NOT DETECTED NOT DETECTED Final   Cyclospora cayetanensis NOT DETECTED NOT DETECTED Final   Entamoeba histolytica NOT DETECTED NOT DETECTED Final   Giardia lamblia NOT DETECTED NOT DETECTED Final   Adenovirus F40/41 NOT DETECTED NOT DETECTED Final   Astrovirus NOT DETECTED NOT DETECTED Final   Norovirus GI/GII NOT DETECTED NOT DETECTED Final   Rotavirus A NOT DETECTED NOT DETECTED Final   Sapovirus (I, II, IV, and V) NOT DETECTED NOT DETECTED Final    Comment: Performed at [facility], [address] [place], [place] [address]  C Difficile Quick Screen w PCR reflex     Status: None   Collection Time: 09/01/19  8:57 PM   Specimen: Stool  Result Value Ref Range Status   C Diff antigen NEGATIVE NEGATIVE Final   C Diff toxin NEGATIVE NEGATIVE Final   C Diff interpretation No C. difficile detected.  Final    Comment: Performed at [facility], [address] [place], [place] [address]  SARS Coronavirus 2 by RT PCR  (hospital order, performed in [facility] hospital lab) Nasopharyngeal Nasopharyngeal Swab     Status: None   Collection Time: 09/01/19 10:12 PM   Specimen: Nasopharyngeal Swab  Result Value Ref Range Status   SARS Coronavirus 2 NEGATIVE NEGATIVE Final    Comment: (NOTE) SARS-CoV-2 target nucleic acids are NOT DETECTED.  The SARS-CoV-2 RNA is generally detectable in upper and lower respiratory specimens during the acute phase of infection. The lowest concentration of SARS-CoV-2 viral copies this assay can detect is 250 copies / mL. A negative result does not preclude SARS-CoV-2 infection and should not be used as the sole basis for treatment or other patient management decisions.  A negative result may occur with  improper specimen collection / handling, submission of specimen other than nasopharyngeal swab, presence of viral mutation(s) within the areas targeted by this assay, and inadequate number of viral copies (<250 copies / mL). A negative result must be combined with clinical observations, patient history, and epidemiological information.  Fact Sheet for Patients:   StrictlyIdeas.no  Fact Sheet for Healthcare Providers: BankingDealers.co.za  This test is not yet approved or  cleared by the [place] FDA and has been authorized for detection and/or diagnosis of SARS-CoV-2 by FDA under an Emergency Use Authorization (EUA).  This EUA will remain in effect (meaning this test can be used) for the duration of the COVID-19 declaration under Section 564(b)(1) of the Act, 21 U.S.C. section 360bbb-3(b)(1), unless the authorization is terminated or revoked sooner.  Performed at [facility], [address] [place], [place] [address]   MRSA PCR Screening     Status: None   Collection Time: 09/01/19 11:50 PM   Specimen: Nasopharyngeal  Result Value Ref Range Status   MRSA by PCR NEGATIVE NEGATIVE Final    Comment:         The GeneXpert MRSA Assay (FDA approved for NASAL specimens only), is one component of a comprehensive MRSA colonization surveillance program. It is not intended to diagnose MRSA infection nor to guide or monitor treatment for MRSA infections. Performed at [facility], [address] [place], [place] [address]      Labs: BNP (last 3 results) No results for input(s): BNP in the last 8760 hours. Basic Metabolic Panel: Recent Labs  Lab 09/01/19 1949 09/01/19 2340 09/02/19 0417 09/03/19 0450  NA 117* 126* 134* 131*  K 5.1 3.6 3.5 4.2  CL 84* 94* 104 102  CO2 20* 17* 23 24  GLUCOSE 1,115* 735* 143* 193*  BUN 24* 24* 20 17  CREATININE 1.73* 1.52* 0.97 0.81  CALCIUM 8.5* 8.2* 8.4* 8.5*   Liver Function Tests: Recent Labs  Lab 09/01/19 1949  AST 35  ALT 53*  ALKPHOS 245*  BILITOT 1.4*  PROT 7.8  ALBUMIN 3.1*   No results for input(s): LIPASE, AMYLASE in the last 168 hours. No results for input(s): AMMONIA in the last 168 hours. CBC: Recent Labs  Lab 09/01/19 1949  WBC 13.3*  HGB 10.3*  HCT 35.4*  MCV 104.7*  PLT 357   Cardiac Enzymes: No results for input(s): CKTOTAL, CKMB, CKMBINDEX, TROPONINI in the last 168 hours. BNP: Invalid input(s): POCBNP CBG: Recent Labs  Lab 09/03/19 0236 09/03/19 0624 09/03/19 0824 09/03/19 1059 09/03/19 1211  GLUCAP 70 252* 90 190* 425*   D-Dimer No results for input(s): DDIMER in the last 72 hours. Hgb A1c Recent Labs    09/01/19 2340  HGBA1C 13.2*   Lipid Profile No results for input(s): CHOL, HDL, LDLCALC, TRIG, CHOLHDL, LDLDIRECT in the last 72 hours. Thyroid function studies No results for input(s): TSH, T4TOTAL, T3FREE, THYROIDAB in the last 72 hours.  Invalid input(s): FREET3 Anemia work up No results for input(s): VITAMINB12, FOLATE, FERRITIN, TIBC, IRON, RETICCTPCT in the last 72 hours. Urinalysis    Component Value Date/Time   COLORURINE STRAW (A) 09/01/2019 1949   APPEARANCEUR CLEAR  (A) 09/01/2019 1949   LABSPEC 1.023 09/01/2019 1949   PHURINE 5.0 09/01/2019 1949   GLUCOSEU >=500 (A) 09/01/2019 1949   HGBUR NEGATIVE 09/01/2019 [place] NEGATIVE 09/01/2019 1949   KETONESUR 5 (A) 09/01/2019 1949   PROTEINUR NEGATIVE 09/01/2019 1949   NITRITE NEGATIVE 09/01/2019 1949   LEUKOCYTESUR  NEGATIVE 09/01/2019 1949   Sepsis Labs Invalid input(s): PROCALCITONIN,  WBC,  LACTICIDVEN Microbiology Recent Results (from the past 240 hour(s))  Gastrointestinal Panel by PCR , Stool     Status: None   Collection Time: 09/01/19  8:57 PM   Specimen: Stool  Result Value Ref Range Status   Campylobacter species NOT DETECTED NOT DETECTED Final   Plesimonas shigelloides NOT DETECTED NOT DETECTED Final   Salmonella species NOT DETECTED NOT DETECTED Final   Yersinia enterocolitica NOT DETECTED NOT DETECTED Final   Vibrio species NOT DETECTED NOT DETECTED Final   Vibrio cholerae NOT DETECTED NOT DETECTED Final   Enteroaggregative E coli (EAEC) NOT DETECTED NOT DETECTED Final   Enteropathogenic E coli (EPEC) NOT DETECTED NOT DETECTED Final   Enterotoxigenic E coli (ETEC) NOT DETECTED NOT DETECTED Final   Shiga like toxin producing E coli (STEC) NOT DETECTED NOT DETECTED Final   Shigella/Enteroinvasive E coli (EIEC) NOT DETECTED NOT DETECTED Final   Cryptosporidium NOT DETECTED NOT DETECTED Final   Cyclospora cayetanensis NOT DETECTED NOT DETECTED Final   Entamoeba histolytica NOT DETECTED NOT DETECTED Final   Giardia lamblia NOT DETECTED NOT DETECTED Final   Adenovirus F40/41 NOT DETECTED NOT DETECTED Final   Astrovirus NOT DETECTED NOT DETECTED Final   Norovirus GI/GII NOT DETECTED NOT DETECTED Final   Rotavirus A NOT DETECTED NOT DETECTED Final   Sapovirus (I, II, IV, and V) NOT DETECTED NOT DETECTED Final    Comment: Performed at [facility], [address] [place], [place] [address]  C Difficile Quick Screen w PCR reflex     Status: None   Collection Time:  09/01/19  8:57 PM   Specimen: Stool  Result Value Ref Range Status   C Diff antigen NEGATIVE NEGATIVE Final   C Diff toxin NEGATIVE NEGATIVE Final   C Diff interpretation No C. difficile detected.  Final    Comment: Performed at [facility], [address] [place], [place] [address]  SARS Coronavirus 2 by RT PCR (hospital order, performed in [facility] hospital lab) Nasopharyngeal Nasopharyngeal Swab     Status: None   Collection Time: 09/01/19 10:12 PM   Specimen: Nasopharyngeal Swab  Result Value Ref Range Status   SARS Coronavirus 2 NEGATIVE NEGATIVE Final    Comment: (NOTE) SARS-CoV-2 target nucleic acids are NOT DETECTED.  The SARS-CoV-2 RNA is generally detectable in upper and lower respiratory specimens during the acute phase of infection. The lowest concentration of SARS-CoV-2 viral copies this assay can detect is 250 copies / mL. A negative result does not preclude SARS-CoV-2 infection and should not be used as the sole basis for treatment or other patient management decisions.  A negative result may occur with improper specimen collection / handling, submission of specimen other than nasopharyngeal swab, presence of viral mutation(s) within the areas targeted by this assay, and inadequate number of viral copies (<250 copies / mL). A negative result must be combined with clinical observations, patient history, and epidemiological information.  Fact Sheet for Patients:   StrictlyIdeas.no  Fact Sheet for Healthcare Providers: BankingDealers.co.za  This test is not yet approved or  cleared by the [place] FDA and has been authorized for detection and/or diagnosis of SARS-CoV-2 by FDA under an Emergency Use Authorization (EUA).  This EUA will remain in effect (meaning this test can be used) for the duration of the COVID-19 declaration under Section 564(b)(1) of the Act, 21 U.S.C. section 360bbb-3(b)(1), unless  the authorization is terminated or  revoked sooner.  Performed at [facility], [address] [place], [place] [address]   MRSA PCR Screening     Status: None   Collection Time: 09/01/19 11:50 PM   Specimen: Nasopharyngeal  Result Value Ref Range Status   MRSA by PCR NEGATIVE NEGATIVE Final    Comment:        The GeneXpert MRSA Assay (FDA approved for NASAL specimens only), is one component of a comprehensive MRSA colonization surveillance program. It is not intended to diagnose MRSA infection nor to guide or monitor treatment for MRSA infections. Performed at [facility], [address] [place], [place] [address]      Time coordinating discharge: Over 30 minutes  SIGNED:   Sidney Ace, MD  [facility] 09/03/2019, 3:43 PM Pager   If 7PM-7AM, please contact night-coverage

## 2019-09-06 LAB — GLUCOSE, CAPILLARY: Glucose-Capillary: 46 mg/dL — ABNORMAL LOW (ref 70–99)

## 2019-10-17 ENCOUNTER — Telehealth: Payer: Self-pay | Admitting: Family

## 2019-10-17 NOTE — Telephone Encounter (Signed)
Called patient left message on voicemail to return call to schedule an appointment with [facility] for right great toe.

## 2019-10-23 ENCOUNTER — Ambulatory Visit: Payer: Medicaid - Out of State | Admitting: Family Medicine

## 2019-12-27 ENCOUNTER — Inpatient Hospital Stay
Admission: EM | Admit: 2019-12-27 | Discharge: 2019-12-29 | DRG: 638 | Discharge status: Against Medical Advice | Payer: Medicaid - Out of State | Attending: Internal Medicine | Admitting: Internal Medicine

## 2019-12-27 ENCOUNTER — Other Ambulatory Visit: Payer: Self-pay

## 2019-12-27 ENCOUNTER — Encounter: Payer: Self-pay | Admitting: Emergency Medicine

## 2019-12-27 DIAGNOSIS — E160 Drug-induced hypoglycemia without coma: Secondary | ICD-10-CM | POA: Diagnosis present

## 2019-12-27 DIAGNOSIS — Z653 Problems related to other legal circumstances: Secondary | ICD-10-CM | POA: Diagnosis not present

## 2019-12-27 DIAGNOSIS — E559 Vitamin D deficiency, unspecified: Secondary | ICD-10-CM | POA: Diagnosis present

## 2019-12-27 DIAGNOSIS — E1165 Type 2 diabetes mellitus with hyperglycemia: Principal | ICD-10-CM

## 2019-12-27 DIAGNOSIS — Z89411 Acquired absence of right great toe: Secondary | ICD-10-CM | POA: Diagnosis not present

## 2019-12-27 DIAGNOSIS — M4850XA Collapsed vertebra, not elsewhere classified, site unspecified, initial encounter for fracture: Secondary | ICD-10-CM | POA: Diagnosis present

## 2019-12-27 DIAGNOSIS — E1065 Type 1 diabetes mellitus with hyperglycemia: Secondary | ICD-10-CM | POA: Diagnosis present

## 2019-12-27 DIAGNOSIS — Z5329 Procedure and treatment not carried out because of patient's decision for other reasons: Secondary | ICD-10-CM | POA: Diagnosis not present

## 2019-12-27 DIAGNOSIS — Z20822 Contact with and (suspected) exposure to covid-19: Secondary | ICD-10-CM | POA: Diagnosis present

## 2019-12-27 DIAGNOSIS — T383X5A Adverse effect of insulin and oral hypoglycemic [antidiabetic] drugs, initial encounter: Secondary | ICD-10-CM | POA: Diagnosis present

## 2019-12-27 DIAGNOSIS — E1040 Type 1 diabetes mellitus with diabetic neuropathy, unspecified: Secondary | ICD-10-CM | POA: Diagnosis present

## 2019-12-27 DIAGNOSIS — E1069 Type 1 diabetes mellitus with other specified complication: Principal | ICD-10-CM | POA: Diagnosis present

## 2019-12-27 DIAGNOSIS — I959 Hypotension, unspecified: Secondary | ICD-10-CM | POA: Diagnosis not present

## 2019-12-27 DIAGNOSIS — Z79899 Other long term (current) drug therapy: Secondary | ICD-10-CM | POA: Diagnosis not present

## 2019-12-27 DIAGNOSIS — R739 Hyperglycemia, unspecified: Secondary | ICD-10-CM

## 2019-12-27 DIAGNOSIS — I10 Essential (primary) hypertension: Secondary | ICD-10-CM | POA: Diagnosis present

## 2019-12-27 DIAGNOSIS — F17210 Nicotine dependence, cigarettes, uncomplicated: Secondary | ICD-10-CM | POA: Diagnosis present

## 2019-12-27 DIAGNOSIS — E860 Dehydration: Secondary | ICD-10-CM | POA: Diagnosis present

## 2019-12-27 DIAGNOSIS — E876 Hypokalemia: Secondary | ICD-10-CM | POA: Diagnosis present

## 2019-12-27 DIAGNOSIS — Z794 Long term (current) use of insulin: Secondary | ICD-10-CM

## 2019-12-27 DIAGNOSIS — E875 Hyperkalemia: Secondary | ICD-10-CM | POA: Diagnosis present

## 2019-12-27 DIAGNOSIS — K529 Noninfective gastroenteritis and colitis, unspecified: Secondary | ICD-10-CM | POA: Diagnosis present

## 2019-12-27 DIAGNOSIS — R824 Acetonuria: Secondary | ICD-10-CM | POA: Diagnosis present

## 2019-12-27 DIAGNOSIS — E1100 Type 2 diabetes mellitus with hyperosmolarity without nonketotic hyperglycemic-hyperosmolar coma (NKHHC): Secondary | ICD-10-CM

## 2019-12-27 DIAGNOSIS — E870 Hyperosmolality and hypernatremia: Secondary | ICD-10-CM | POA: Diagnosis present

## 2019-12-27 LAB — URINALYSIS, COMPLETE (UACMP) WITH MICROSCOPIC
Bacteria, UA: NONE SEEN
Bilirubin Urine: NEGATIVE
Glucose, UA: >=500 mg/dL — AB
Hgb urine dipstick: NEGATIVE
Ketones, ur: 5 mg/dL — AB
Leukocytes,Ua: NEGATIVE
Nitrite: NEGATIVE
Protein, ur: NEGATIVE mg/dL
Specific Gravity, Urine: 1.022 (ref 1.005–1.030)
Squamous Epithelial / HPF: NONE SEEN (ref 0–5)
pH: 5 (ref 5.0–8.0)

## 2019-12-27 LAB — BASIC METABOLIC PANEL
Anion gap: 11 (ref 5–15)
BUN: 25 mg/dL — ABNORMAL HIGH (ref 6–20)
CO2: 19 mmol/L — ABNORMAL LOW (ref 22–32)
Calcium: 8.8 mg/dL — ABNORMAL LOW (ref 8.9–10.3)
Chloride: 106 mmol/L (ref 98–111)
Creatinine, Ser: 1.08 mg/dL (ref 0.61–1.24)
GFR, Estimated: >60 mL/min (ref >60)
Glucose, Bld: 168 mg/dL — ABNORMAL HIGH (ref 70–99)
Potassium: 3.3 mmol/L — ABNORMAL LOW (ref 3.5–5.1)
Sodium: 136 mmol/L (ref 135–145)

## 2019-12-27 LAB — CBC
HCT: 32.4 % — ABNORMAL LOW (ref 39.0–52.0)
Hemoglobin: 10 g/dL — ABNORMAL LOW (ref 13.0–17.0)
MCH: 30.7 pg (ref 26.0–34.0)
MCHC: 30.9 g/dL (ref 30.0–36.0)
MCV: 99.4 fL (ref 80.0–100.0)
Platelets: 326 10*3/uL (ref 150–400)
RBC: 3.26 MIL/uL — ABNORMAL LOW (ref 4.22–5.81)
RDW: 14.6 % (ref 11.5–15.5)
WBC: 6.9 10*3/uL (ref 4.0–10.5)
nRBC: 0 % (ref 0.0–0.2)

## 2019-12-27 LAB — CBG MONITORING, ED
Glucose-Capillary: >600 mg/dL (ref 70–99)
Glucose-Capillary: >600 mg/dL (ref 70–99)
Glucose-Capillary: >600 mg/dL (ref 70–99)
Glucose-Capillary: >600 mg/dL (ref 70–99)
Glucose-Capillary: 363 mg/dL — ABNORMAL HIGH (ref 70–99)
Glucose-Capillary: 211 mg/dL — ABNORMAL HIGH (ref 70–99)
Glucose-Capillary: 84 mg/dL (ref 70–99)

## 2019-12-27 LAB — BLOOD GAS, VENOUS
Acid-base deficit: 7.1 mmol/L — ABNORMAL HIGH (ref 0.0–2.0)
Bicarbonate: 19.7 mmol/L — ABNORMAL LOW (ref 20.0–28.0)
O2 Saturation: 82.8 %
Patient temperature: 37
pCO2, Ven: 44 mmHg (ref 44.0–60.0)
pH, Ven: 7.26 (ref 7.250–7.430)
pO2, Ven: 55 mmHg — ABNORMAL HIGH (ref 32.0–45.0)

## 2019-12-27 LAB — COMPREHENSIVE METABOLIC PANEL
ALT: 36 U/L (ref 0–44)
AST: 46 U/L — ABNORMAL HIGH (ref 15–41)
Albumin: 3.2 g/dL — ABNORMAL LOW (ref 3.5–5.0)
Alkaline Phosphatase: 231 U/L — ABNORMAL HIGH (ref 38–126)
Anion gap: 11 (ref 5–15)
BUN: 30 mg/dL — ABNORMAL HIGH (ref 6–20)
CO2: 18 mmol/L — ABNORMAL LOW (ref 22–32)
Calcium: 8.2 mg/dL — ABNORMAL LOW (ref 8.9–10.3)
Chloride: 91 mmol/L — ABNORMAL LOW (ref 98–111)
Creatinine, Ser: 1.35 mg/dL — ABNORMAL HIGH (ref 0.61–1.24)
GFR, Estimated: >60 mL/min (ref >60)
Glucose, Bld: 1093 mg/dL (ref 70–99)
Potassium: 5.3 mmol/L — ABNORMAL HIGH (ref 3.5–5.1)
Sodium: 120 mmol/L — ABNORMAL LOW (ref 135–145)
Total Bilirubin: 1 mg/dL (ref 0.3–1.2)
Total Protein: 7.7 g/dL (ref 6.5–8.1)

## 2019-12-27 LAB — OSMOLALITY: Osmolality: 336 mOsm/kg (ref 275–295)

## 2019-12-27 LAB — BETA-HYDROXYBUTYRIC ACID: Beta-Hydroxybutyric Acid: 2.33 mmol/L — ABNORMAL HIGH (ref 0.05–0.27)

## 2019-12-27 LAB — RESPIRATORY PANEL BY RT PCR (FLU A&B, COVID)
Influenza A by PCR: NEGATIVE
Influenza B by PCR: NEGATIVE
SARS Coronavirus 2 by RT PCR: NEGATIVE

## 2019-12-27 MED ORDER — INSULIN GLARGINE 100 UNIT/ML ~~LOC~~ SOLN
40.0000 [IU] | Freq: Every day | SUBCUTANEOUS | Status: DC
  Administered 2019-12-28: 40 [IU] via SUBCUTANEOUS
  Filled 2019-12-27 (×3): qty 0.4

## 2019-12-27 MED ORDER — DEXTROSE 50 % IV SOLN: 0.0000 mL | INTRAVENOUS | Status: DC | PRN

## 2019-12-27 MED ORDER — INSULIN GLARGINE 100 UNIT/ML ~~LOC~~ SOLN
20.0000 [IU] | Freq: Once | SUBCUTANEOUS | Status: DC
  Filled 2019-12-27 (×2): qty 0.2

## 2019-12-27 MED ORDER — POTASSIUM CHLORIDE CRYS ER 20 MEQ PO TBCR: 40.0000 meq | EXTENDED_RELEASE_TABLET | Freq: Once | ORAL | Status: DC

## 2019-12-27 MED ORDER — INSULIN ASPART 100 UNIT/ML ~~LOC~~ SOLN
3.0000 [IU] | Freq: Three times a day (TID) | SUBCUTANEOUS | Status: DC
  Filled 2019-12-27: qty 1

## 2019-12-27 MED ORDER — METOPROLOL TARTRATE 25 MG PO TABS
12.5000 mg | ORAL_TABLET | Freq: Two times a day (BID) | ORAL | Status: DC
  Administered 2019-12-28: 12.5 mg via ORAL
  Filled 2019-12-27: qty 1

## 2019-12-27 MED ORDER — PANTOPRAZOLE SODIUM 40 MG PO TBEC
40.0000 mg | DELAYED_RELEASE_TABLET | Freq: Every day | ORAL | Status: DC
  Administered 2019-12-28 – 2019-12-29 (×2): 40 mg via ORAL
  Filled 2019-12-27 (×2): qty 1

## 2019-12-27 MED ORDER — INSULIN ASPART 100 UNIT/ML ~~LOC~~ SOLN: 0.0000 [IU] | Freq: Every day | SUBCUTANEOUS | Status: DC

## 2019-12-27 MED ORDER — LACTATED RINGERS IV BOLUS
1000.0000 mL | Freq: Once | INTRAVENOUS | Status: AC
  Administered 2019-12-27: 1000 mL via INTRAVENOUS

## 2019-12-27 MED ORDER — INSULIN REGULAR(HUMAN) IN NACL 100-0.9 UT/100ML-% IV SOLN
INTRAVENOUS | Status: DC
  Administered 2019-12-27: 9.5 [IU]/h via INTRAVENOUS
  Filled 2019-12-27: qty 100

## 2019-12-27 MED ORDER — ACETAMINOPHEN 325 MG PO TABS: 650.0000 mg | ORAL_TABLET | Freq: Four times a day (QID) | ORAL | Status: DC | PRN

## 2019-12-27 MED ORDER — ONDANSETRON HCL 4 MG/2ML IJ SOLN: 4.0000 mg | Freq: Four times a day (QID) | INTRAMUSCULAR | Status: DC | PRN

## 2019-12-27 MED ORDER — LACTATED RINGERS IV SOLN: INTRAVENOUS | Status: DC

## 2019-12-27 MED ORDER — DEXTROSE IN LACTATED RINGERS 5 % IV SOLN: INTRAVENOUS | Status: DC

## 2019-12-27 MED ORDER — INSULIN ASPART 100 UNIT/ML ~~LOC~~ SOLN
0.0000 [IU] | Freq: Three times a day (TID) | SUBCUTANEOUS | Status: DC
  Administered 2019-12-28: 5 [IU] via SUBCUTANEOUS
  Administered 2019-12-28: 3 [IU] via SUBCUTANEOUS
  Filled 2019-12-27 (×3): qty 1

## 2019-12-27 NOTE — H&P (Signed)
[facility] History and Physical   Patient: Garrett [name] XEN:[id number]   PCP: Patient, No Pcp Per DOB: 09-04-1985   DOA: 12/27/2019   DOS: 12/27/2019   DOS: the patient was seen and examined on 12/27/2019   Patient coming from: The patient is coming from Home  Chief Complaint: Generalized weakness and high blood glucose level  HPI: Garrett [name] is a 34 y.o. male with Past medical history of type 1 diabetes mellitus poorly controlled, previous cervical spine compression fracture, hypertension, as reviewed from EMR, presented at [facility] ED due to feeling poor in the morning and noticed high blood glucose level in the morning so he came into the ED, patient was sitting in the waiting area, did not check 10 and he slumped over in the chair per ED attending, there was no new friends or family member with him, patient was brought into the ED room and was found to have very high blood glucose on POCT x2.  Patient told ED physician that he was feeling generalized weakness, poor feelings and high blood glucose in the morning.  Patient denied any other complaints, no fever or chills, no chest pain, no abdominal pain, no nausea vomiting or diarrhea.  During my exam in the ED patient was sleepy and did not offer any complaints, he was noting head that he feels fine, and had no questions for me.   ED Course: Blood glucose 1093, mild acidosis bicarbonate 18, anion gap within normal range, mild hyperkalemia potassium 5.3, pseudohyponatremia sodium 120 Urine ketones positive VBG did not show any acidosis so patient is not in DKA Patient was in hyperosmolar hyperglycemic state, insulin IV infusion was started by ED physician and admitted for further management as below   Review of Systems: as mentioned in the history of present illness.  All other systems reviewed and are negative.  Past Medical History:  Diagnosis Date  . Compression fracture of C-spine ([place])   . Type 1 diabetes [facility])    Past  Surgical History:  Procedure Laterality Date  . AMPUTATION Right 04/10/2019   Procedure: AMPUTATION GREAT RIGHT TOE;  Surgeon: Newt Minion, MD;  Location: [place];  Service: Orthopedics;  Laterality: Right;  . NO PAST SURGERIES     Social History:  reports that he has been smoking cigarettes. He has been smoking about 0.50 packs per day. He has never used smokeless tobacco. He reports current alcohol use. He reports current drug use. Drug: Marijuana.  No Known Allergies   Family history reviewed and not pertinent Family History  Family history unknown: Yes     Prior to Admission medications   Medication Sig Start Date End Date Taking? Authorizing Provider  blood glucose meter kit and supplies Dispense based on patient and insurance preference. Use up to four times daily as directed. (FOR ICD-10 E10.9, E11.9). 09/03/19   Sidney Ace, MD  diphenoxylate-atropine (LOMOTIL) 2.5-0.025 MG tablet Take 1 tablet by mouth 4 (four) times daily as needed for diarrhea or loose stools. 09/03/19   Sidney Ace, MD  insulin NPH-regular Human (NOVOLIN 70/30) (70-30) 100 UNIT/ML injection Inject 20 Units into the skin 2 (two) times daily with a meal. Inject 30 units into the skin before breakfast and 30 units at bedtime 09/03/19 10/03/19  Ralene Muskrat B, MD  insulin regular (NOVOLIN R) 100 units/mL injection Inject 3-4 Units into the skin See admin instructions. Inject 3-4 units into the skin "as needed" two times a day (with breakfast and dinner) per  sliding scale    [provider]  lisinopril (ZESTRIL) 5 MG tablet Take 1 tablet (5 mg total) by mouth daily. 09/03/19 10/03/19  Sidney Ace, MD  metoprolol tartrate (LOPRESSOR) 25 MG tablet Take 0.5 tablets (12.5 mg total) by mouth 2 (two) times daily. 09/03/19 10/03/19  Ralene Muskrat B, MD  psyllium (HYDROCIL/METAMUCIL) 95 % PACK Take 1 packet by mouth daily. 09/04/19   Sidney Ace, MD    Physical Exam: Vitals:    12/27/19 1630 12/27/19 1700 12/27/19 1730 12/27/19 1800  BP: (!) 150/95 (!) 148/97 139/89 112/72  Pulse: (!) 107 100 93 84  Resp: $Remo'14 15 17 12  'gghYq$ Temp:      TempSrc:      SpO2: 99% 100% 100% 100%  Weight:      Height:        General: lethargic and sleepy.  Eyes: Sleepy, patient open eyes briefly and dozed off  ENT: Seems dry Neck: no JVD, no Abnormal Mass Or lumps Cardiovascular: S1 and S2 Present, no Murmur, peripheral pulses symmetrical Respiratory: good respiratory effort, Bilateral Air entry equal and Decreased, no signs of accessory muscle use, Clear to Auscultation, no Crackles, no wheezes Abdomen: Bowel Sound present, Soft and no tenderness, no hernia Skin: no rashes  Extremities: 1-2+ Pedal edema,  calf tenderness Neurologic: without any new focal findings, patient was sleepy, moving extremities spontaneously.  Could not do complete exam as patient was sleepy Gait not checked due to patient safety concerns  Data Reviewed: I have personally reviewed and interpreted labs, imaging as discussed below.  CBC: Recent Labs  Lab 12/27/19 1427  WBC 6.9  HGB 10.0*  HCT 32.4*  MCV 99.4  PLT 458   Basic Metabolic Panel: Recent Labs  Lab 12/27/19 1427  NA 120*  K 5.3*  CL 91*  CO2 18*  GLUCOSE 1,093*  BUN 30*  CREATININE 1.35*  CALCIUM 8.2*   GFR: Estimated Creatinine Clearance: 72.8 mL/min (A) (by C-G formula based on SCr of 1.35 mg/dL (H)). Liver Function Tests: Recent Labs  Lab 12/27/19 1427  AST 46*  ALT 36  ALKPHOS 231*  BILITOT 1.0  PROT 7.7  ALBUMIN 3.2*   No results for input(s): LIPASE, AMYLASE in the last 168 hours. No results for input(s): AMMONIA in the last 168 hours. Coagulation Profile: No results for input(s): INR, PROTIME in the last 168 hours. Cardiac Enzymes: No results for input(s): CKTOTAL, CKMB, CKMBINDEX, TROPONINI in the last 168 hours. BNP (last 3 results) No results for input(s): PROBNP in the last 8760 hours. HbA1C: No results  for input(s): HGBA1C in the last 72 hours. CBG: Recent Labs  Lab 12/27/19 1414 12/27/19 1415 12/27/19 1546 12/27/19 1651  GLUCAP >600* >600* >600* >600*   Lipid Profile: No results for input(s): CHOL, HDL, LDLCALC, TRIG, CHOLHDL, LDLDIRECT in the last 72 hours. Thyroid Function Tests: No results for input(s): TSH, T4TOTAL, FREET4, T3FREE, THYROIDAB in the last 72 hours. Anemia Panel: No results for input(s): VITAMINB12, FOLATE, FERRITIN, TIBC, IRON, RETICCTPCT in the last 72 hours. Urine analysis:    Component Value Date/Time   COLORURINE COLORLESS (A) 12/27/2019 1427   APPEARANCEUR CLEAR (A) 12/27/2019 1427   LABSPEC 1.022 12/27/2019 1427   PHURINE 5.0 12/27/2019 1427   GLUCOSEU >=500 (A) 12/27/2019 1427   HGBUR NEGATIVE 12/27/2019 1427   BILIRUBINUR NEGATIVE 12/27/2019 1427   KETONESUR 5 (A) 12/27/2019 1427   PROTEINUR NEGATIVE 12/27/2019 1427   NITRITE NEGATIVE 12/27/2019 1427   LEUKOCYTESUR NEGATIVE 12/27/2019  [place] on Admission: No results found.   I reviewed all nursing notes, pharmacy notes, vitals, pertinent old records.  Assessment/Plan  Active Problems:   Type 1 diabetes mellitus with hyperosmolar hyperglycemic state (HHS) ([place])   # Hyperosmolar hyperglycemic state uncontrolled type 1 diabetes mellitus Started insulin IV infusion Started Lantus 20 units given x1 and 40 units nightly At home patient was using Novolin 70/30, please confirm how how much patient was using and then titrate accordingly Ringer lactate 2 L IV bolus given in the ED Started LR 125 mill per hour for overnight hydration Change fluids as per hypoglycemia protocol   #Pseudohyponatremia secondary to hyperglycemia Check sodium level daily  #Hyperkalemia secondary to mild acidosis Monitor potassium level  #Hypertension Resumed metoprolol 12.5 mg p.o. twice daily Held the lisinopril for now Monitor BP and titrate medications accordingly   Nutrition: Carb  modified diet DVT Prophylaxis: Subcutaneous Lovenox  Advance goals of care discussion: Full code   Consults: None  Family Communication: family was not present at bedside, at the time of interview.  Opportunity was given to ask question and all questions were answered satisfactorily.  Disposition: Admitted as inpatient on telemetry, step-down unit. Likely to be discharged home, in 1-2 days   I have discussed plan of care as described above with RN and patient/family.  Severity of Illness: The appropriate patient status for this patient is INPATIENT. Inpatient status is judged to be reasonable and necessary in order to provide the required intensity of service to ensure the patient's safety. The patient's presenting symptoms, physical exam findings, and initial radiographic and laboratory data in the context of their chronic comorbidities is felt to place them at high risk for further clinical deterioration. Furthermore, it is not anticipated that the patient will be medically stable for discharge from the hospital within 2 midnights of admission. The following factors support the patient status of inpatient.   " The patient's presenting symptoms include lethargic. " The worrisome physical exam findings include lethargic and sleepy. " The initial radiographic and laboratory data are worrisome because of hyperglycemia. " The chronic co-morbidities include uncontrolled type 1 diabetes.   * I certify that at the point of admission it is my clinical judgment that the patient will require inpatient hospital care spanning beyond 2 midnights from the point of admission due to high intensity of service, high risk for further deterioration and high frequency of surveillance required.*    Author: Val Riles, MD [facility] 12/27/2019 6:30 PM   To reach On-call, see care teams to locate the attending and reach out to them via [email or web address]. If 7PM-7AM, please contact night-coverage If  you still have difficulty reaching the attending provider, please page the [facility] (Director on Call) for [facility] on amion for assistance.

## 2019-12-27 NOTE — ED Triage Notes (Signed)
Pt found sleeping in ED lobby chair in the corner, pt difficult to arouse. Pt stated that he needed to be seen for blood sugar issues. Pt falling asleep during registration and triage. Pt very quite and slow to answer questions. Pts CBG read "high" twice in triage. Pt states that he does have hx/o diabetes, pt has not taken his medications today.

## 2019-12-27 NOTE — ED Notes (Signed)
Per Md Lucianne Muss start process of transitioning patient off of insulin drip. Pharmacy contacted to send 40 units lantus at this time.

## 2019-12-27 NOTE — ED Provider Notes (Signed)
[facility] Emergency Department Provider Note ____________________________________________   First MD Initiated Contact with Patient 12/27/19 1418     (approximate)  I have reviewed the triage vital signs and the nursing notes.  HISTORY  Chief Complaint Hyperglycemia   HPI Garrett [name] is a 34 y.o. malewho presents to the ED for evaluation of hyperglycemia  Chart review indicates history of type 1 diabetes on insulin.  Previous cervical spinal compression fracture.  Patient was found in our ED waiting room slumped over in a chair, not checked in, and without family or friends with him.  He was brought back to the room after CBG read "high" x2.  I see him in the room and he indicates that he started feeling poorly this morning with generalized weakness and "high sugars."  He denies recent illnesses, fevers, trauma, syncope, chest pain, shortness of breath, abdominal pain or headache.  He just reports that he feels poorly.   Past Medical History:  Diagnosis Date  . Compression fracture of C-spine ([place])   . Type 1 diabetes [facility])     Patient Active Problem List   Diagnosis Date Noted  . Type 2 diabetes mellitus with hyperosmolar hyperglycemic state (HHS) ([place]) 09/01/2019  . Amputated great toe of right foot ([place]) 05/15/2019  . Hyperglycemia 04/24/2019  . Elevated alkaline phosphatase level 04/24/2019  . Hyperglycemia due to type 1 diabetes mellitus ([place]) 04/23/2019  . AKI (acute kidney injury) ([place]) 04/23/2019  . Hypertension associated with diabetes ([place]) 04/23/2019  . Encephalopathy 04/23/2019  . Abscess of great toe, right   . Diabetic polyneuropathy associated with type 1 diabetes mellitus ([place])   . Osteomyelitis of great toe of right foot ([place])   . Cellulitis 04/07/2019  . Leukocytosis 04/07/2019  . Lactic acidosis 04/07/2019  . Left arm cellulitis 08/15/2018  . Diabetes mellitus with hyperglycemia ([place]) 08/15/2018  . DKA (diabetic  ketoacidoses) 03/27/2017    Past Surgical History:  Procedure Laterality Date  . AMPUTATION Right 04/10/2019   Procedure: AMPUTATION GREAT RIGHT TOE;  Surgeon: Newt Minion, MD;  Location: [place];  Service: Orthopedics;  Laterality: Right;  . NO PAST SURGERIES      Prior to Admission medications   Medication Sig Start Date End Date Taking? Authorizing Provider  blood glucose meter kit and supplies Dispense based on patient and insurance preference. Use up to four times daily as directed. (FOR ICD-10 E10.9, E11.9). 09/03/19   Sidney Ace, MD  diphenoxylate-atropine (LOMOTIL) 2.5-0.025 MG tablet Take 1 tablet by mouth 4 (four) times daily as needed for diarrhea or loose stools. 09/03/19   Sidney Ace, MD  insulin NPH-regular Human (NOVOLIN 70/30) (70-30) 100 UNIT/ML injection Inject 20 Units into the skin 2 (two) times daily with a meal. Inject 30 units into the skin before breakfast and 30 units at bedtime 09/03/19 10/03/19  Ralene Muskrat B, MD  insulin regular (NOVOLIN R) 100 units/mL injection Inject 3-4 Units into the skin See admin instructions. Inject 3-4 units into the skin "as needed" two times a day (with breakfast and dinner) per sliding scale    [provider]  lisinopril (ZESTRIL) 5 MG tablet Take 1 tablet (5 mg total) by mouth daily. 09/03/19 10/03/19  Sidney Ace, MD  metoprolol tartrate (LOPRESSOR) 25 MG tablet Take 0.5 tablets (12.5 mg total) by mouth 2 (two) times daily. 09/03/19 10/03/19  Ralene Muskrat B, MD  psyllium (HYDROCIL/METAMUCIL) 95 % PACK Take 1 packet by mouth daily. 09/04/19  Sidney Ace, MD    Allergies Patient has no known allergies.  Family History  Family history unknown: Yes    Social History Social History   Tobacco Use  . Smoking status: Current Every Day Smoker    Packs/day: 0.50    Types: Cigarettes  . Smokeless tobacco: Never Used  Vaping Use  . Vaping Use: Never used  Substance Use Topics  .  Alcohol use: Yes    Comment: occasionally  . Drug use: Yes    Types: Marijuana    Review of Systems  Constitutional: No fever/chills Eyes: No visual changes. ENT: No sore throat. Cardiovascular: Denies chest pain. Respiratory: Denies shortness of breath. Gastrointestinal: No abdominal pain.  No nausea, no vomiting.  No diarrhea.  No constipation. Genitourinary: Negative for dysuria. Musculoskeletal: Negative for back pain. Skin: Negative for rash. Neurological: Negative for headaches, focal weakness or numbness.   ____________________________________________   PHYSICAL EXAM:  VITAL SIGNS: Vitals:   12/27/19 1428 12/27/19 1500  BP: (!) 159/97   Pulse:  (!) 109  Resp:  13  Temp: 98.3 F (36.8 C)   SpO2:  99%      Constitutional: Somnolent and laying on his side, will open his eyes to voice and answer questions appropriately.  Follows commands in all 4 extremities. Eyes: Conjunctivae are normal. PERRL. EOMI. Head: Atraumatic. Nose: No congestion/rhinnorhea. Mouth/Throat: Mucous membranes are dry.  Oropharynx non-erythematous. Neck: No stridor. No cervical spine tenderness to palpation. Cardiovascular: Tachycardic rate, regular rhythm. Grossly normal heart sounds.  Good peripheral circulation. Respiratory: Normal respiratory effort.  No retractions. Lungs CTAB. Gastrointestinal: Soft , nondistended, nontender to palpation. No CVA tenderness. Musculoskeletal: No lower extremity tenderness nor edema.  No joint effusions. No signs of acute trauma. Neurologic:  Normal speech and language. No gross focal neurologic deficits are appreciated. Skin:  Skin is warm, dry and intact. No rash noted. Psychiatric: Mood and affect are normal. Speech and behavior are normal.  ____________________________________________   LABS (all labs ordered are listed, but only abnormal results are displayed)  Labs Reviewed  CBC - Abnormal; Notable for the following components:      Result  Value   RBC 3.26 (*)    Hemoglobin 10.0 (*)    HCT 32.4 (*)    All other components within normal limits  URINALYSIS, COMPLETE (UACMP) WITH MICROSCOPIC - Abnormal; Notable for the following components:   Color, Urine COLORLESS (*)    APPearance CLEAR (*)    Glucose, UA >=500 (*)    Ketones, ur 5 (*)    All other components within normal limits  COMPREHENSIVE METABOLIC PANEL - Abnormal; Notable for the following components:   Sodium 120 (*)    Potassium 5.3 (*)    Chloride 91 (*)    CO2 18 (*)    Glucose, Bld 1,093 (*)    BUN 30 (*)    Creatinine, Ser 1.35 (*)    Calcium 8.2 (*)    Albumin 3.2 (*)    AST 46 (*)    Alkaline Phosphatase 231 (*)    All other components within normal limits  BLOOD GAS, VENOUS - Abnormal; Notable for the following components:   pO2, Ven 55.0 (*)    Bicarbonate 19.7 (*)    Acid-base deficit 7.1 (*)    All other components within normal limits  BETA-HYDROXYBUTYRIC ACID - Abnormal; Notable for the following components:   Beta-Hydroxybutyric Acid 2.33 (*)    All other components within normal limits  CBG MONITORING,  ED - Abnormal; Notable for the following components:   Glucose-Capillary >600 (*)    All other components within normal limits  CBG MONITORING, ED - Abnormal; Notable for the following components:   Glucose-Capillary >600 (*)    All other components within normal limits  RESPIRATORY PANEL BY RT PCR (FLU A&B, COVID)  OSMOLALITY  URINALYSIS, ROUTINE W REFLEX MICROSCOPIC  BASIC METABOLIC PANEL  BASIC METABOLIC PANEL  CBG MONITORING, ED  CBG MONITORING, ED  CBG MONITORING, ED   ____________________________________________  12 Lead EKG  Sinus rhythm, rate of 102 bpm.  Normal axis and normal intervals.  Stigmata of LVH.  No evidence of acute ischemia. ____________________________________________   PROCEDURES and INTERVENTIONS  Procedure(s) performed (including Critical Care):  .1-3 Lead EKG Interpretation Performed by: Vladimir Crofts, MD Authorized by: Vladimir Crofts, MD     Interpretation: abnormal     ECG rate:  110   ECG rate assessment: tachycardic     Rhythm: sinus tachycardia     Ectopy: none     Conduction: normal   .Critical Care Performed by: Vladimir Crofts, MD Authorized by: Vladimir Crofts, MD   Critical care provider statement:    Critical care time (minutes):  45   Critical care was necessary to treat or prevent imminent or life-threatening deterioration of the following conditions:  Endocrine crisis, metabolic crisis and dehydration   Critical care was time spent personally by me on the following activities:  Discussions with consultants, evaluation of patient's response to treatment, examination of patient, ordering and performing treatments and interventions, ordering and review of laboratory studies, ordering and review of radiographic studies, pulse oximetry, re-evaluation of patient's condition, obtaining history from patient or surrogate and review of old charts    Medications  lactated ringers bolus 1,000 mL (has no administration in time range)  insulin regular, human (MYXREDLIN) 100 units/ 100 mL infusion (has no administration in time range)  lactated ringers infusion (has no administration in time range)  dextrose 5 % in lactated ringers infusion (has no administration in time range)  dextrose 50 % solution 0-50 mL (has no administration in time range)  lactated ringers bolus 1,000 mL (0 mLs Intravenous Stopped 12/27/19 1537)    ____________________________________________   MDM / ED COURSE   34 year old male with history of diabetes presents to the ED altered with evidence of HHS requiring medical admission.  Patient tachycardic, but hemodynamically stable on room air.  Exam demonstrates a somnolent patient who has a nonfocal neuro exam and no evidence of distress or trauma.  Blood glucose reading high at the bedside, and metabolic panel returns with glucose of nearly 1100 and no anion  gap.  Marginal elevation of his beta hydroxybutyrate indicates a mild degree of DKA, but primarily HHS.  Patient received 2 L of LR, and then initiation of an insulin drip.  Will admit the patient to hospitalist medicine further work-up and management.   Clinical Course as of Dec 26 1537  Fri Dec 27, 2019  1455 Lab called with glucose over 1000, second liter of fluids ordered and osmolality added to blood work   [DS]  [employer] with admitting hospitalist who agrees to see the patient for medical admission.   [DS]    Clinical Course User Index [DS] Vladimir Crofts, MD    ____________________________________________   FINAL CLINICAL IMPRESSION(S) / ED DIAGNOSES  Final diagnoses:  Hyperosmolar hyperglycemic state (HHS) [facility])  Hyperglycemia  Dehydration     ED Discharge Orders  None       Vladimir Crofts   Note:  This document was prepared using Systems analyst and may include unintentional dictation errors.   Vladimir Crofts, MD 12/27/19 [phone]

## 2019-12-27 NOTE — ED Notes (Signed)
This RN at bedside. Pt 18g in LAC pulled out and fluids on floor. IV with insulin remains intact. Will obtain 2nd IV access.

## 2019-12-27 NOTE — ED Notes (Signed)
First Nurse Note: Pt found sleeping in the corner of the lobby, pt difficult to wake up. Pt states that he needs to be seen for his blood sugar. Pt falling asleep in the wheelchair, having a hard time staying awake.

## 2019-12-27 NOTE — ED Notes (Signed)
Date and time results received: 12/27/19 2:55 PM  (use smartphrase ".now" to insert current time)  Test: Glucose  Critical Value: 1,093  Name of Provider Notified: Dr. Katrinka Blazing   Orders Received? Or Actions Taken?: No new orders at this time

## 2019-12-27 NOTE — ED Notes (Signed)
Attempted to call report to icu, unable to take report at this time. Per secretary patient hasn't been approved to come to the icu yet.

## 2019-12-28 ENCOUNTER — Encounter: Payer: Self-pay | Admitting: Student

## 2019-12-28 DIAGNOSIS — E1065 Type 1 diabetes mellitus with hyperglycemia: Secondary | ICD-10-CM | POA: Diagnosis not present

## 2019-12-28 DIAGNOSIS — E1069 Type 1 diabetes mellitus with other specified complication: Secondary | ICD-10-CM | POA: Diagnosis not present

## 2019-12-28 LAB — BASIC METABOLIC PANEL
Anion gap: 9 (ref 5–15)
Anion gap: 9 (ref 5–15)
BUN: 21 mg/dL — ABNORMAL HIGH (ref 6–20)
BUN: 19 mg/dL (ref 6–20)
CO2: 22 mmol/L (ref 22–32)
CO2: 22 mmol/L (ref 22–32)
Calcium: 8.4 mg/dL — ABNORMAL LOW (ref 8.9–10.3)
Calcium: 8.5 mg/dL — ABNORMAL LOW (ref 8.9–10.3)
Chloride: 105 mmol/L (ref 98–111)
Chloride: 105 mmol/L (ref 98–111)
Creatinine, Ser: 0.91 mg/dL (ref 0.61–1.24)
Creatinine, Ser: 0.88 mg/dL (ref 0.61–1.24)
GFR, Estimated: >60 mL/min (ref >60)
GFR, Estimated: >60 mL/min (ref >60)
Glucose, Bld: 246 mg/dL — ABNORMAL HIGH (ref 70–99)
Glucose, Bld: 451 mg/dL — ABNORMAL HIGH (ref 70–99)
Potassium: 3.4 mmol/L — ABNORMAL LOW (ref 3.5–5.1)
Potassium: 3.7 mmol/L (ref 3.5–5.1)
Sodium: 136 mmol/L (ref 135–145)
Sodium: 136 mmol/L (ref 135–145)

## 2019-12-28 LAB — GLUCOSE, CAPILLARY
Glucose-Capillary: 476 mg/dL — ABNORMAL HIGH (ref 70–99)
Glucose-Capillary: 400 mg/dL — ABNORMAL HIGH (ref 70–99)
Glucose-Capillary: 275 mg/dL — ABNORMAL HIGH (ref 70–99)
Glucose-Capillary: 405 mg/dL — ABNORMAL HIGH (ref 70–99)
Glucose-Capillary: 202 mg/dL — ABNORMAL HIGH (ref 70–99)
Glucose-Capillary: 185 mg/dL — ABNORMAL HIGH (ref 70–99)

## 2019-12-28 LAB — HEMOGLOBIN A1C
Hgb A1c MFr Bld: 13.6 % — ABNORMAL HIGH (ref 4.8–5.6)
Mean Plasma Glucose: 343.62 mg/dL

## 2019-12-28 LAB — OSMOLALITY: Osmolality: 295 mOsm/kg (ref 275–295)

## 2019-12-28 LAB — VITAMIN D 25 HYDROXY (VIT D DEFICIENCY, FRACTURES): Vit D, 25-Hydroxy: 14.08 ng/mL — ABNORMAL LOW (ref 30–100)

## 2019-12-28 LAB — PHOSPHORUS: Phosphorus: 2.7 mg/dL (ref 2.5–4.6)

## 2019-12-28 LAB — MAGNESIUM: Magnesium: 1.9 mg/dL (ref 1.7–2.4)

## 2019-12-28 LAB — TSH: TSH: 0.79 u[IU]/mL (ref 0.350–4.500)

## 2019-12-28 MED ORDER — INSULIN ASPART 100 UNIT/ML ~~LOC~~ SOLN
10.0000 [IU] | Freq: Once | SUBCUTANEOUS | Status: AC
  Administered 2019-12-28: 10 [IU] via SUBCUTANEOUS

## 2019-12-28 MED ORDER — LISINOPRIL 10 MG PO TABS
5.0000 mg | ORAL_TABLET | Freq: Every day | ORAL | Status: DC
  Administered 2019-12-28: 5 mg via ORAL
  Filled 2019-12-28: qty 1

## 2019-12-28 MED ORDER — INSULIN GLARGINE 100 UNIT/ML ~~LOC~~ SOLN
35.0000 [IU] | Freq: Two times a day (BID) | SUBCUTANEOUS | Status: DC
  Administered 2019-12-28: 35 [IU] via SUBCUTANEOUS
  Filled 2019-12-28 (×3): qty 0.35

## 2019-12-28 MED ORDER — LACTATED RINGERS IV BOLUS: 20.0000 mL/kg | Freq: Once | INTRAVENOUS | Status: DC

## 2019-12-28 MED ORDER — ENOXAPARIN SODIUM 40 MG/0.4ML ~~LOC~~ SOLN: 40.0000 mg | SUBCUTANEOUS | Status: DC

## 2019-12-28 MED ORDER — DEXTROSE 50 % IV SOLN
0.0000 mL | INTRAVENOUS | Status: DC | PRN
  Administered 2019-12-29: 50 mL via INTRAVENOUS
  Filled 2019-12-28: qty 50

## 2019-12-28 MED ORDER — VITAMIN D (ERGOCALCIFEROL) 1.25 MG (50000 UNIT) PO CAPS
50000.0000 [IU] | ORAL_CAPSULE | Freq: Once | ORAL | Status: AC
  Administered 2019-12-28: 50000 [IU] via ORAL
  Filled 2019-12-28: qty 1

## 2019-12-28 MED ORDER — ENOXAPARIN SODIUM 40 MG/0.4ML ~~LOC~~ SOLN
40.0000 mg | SUBCUTANEOUS | Status: DC
  Administered 2019-12-28 – 2019-12-29 (×2): 40 mg via SUBCUTANEOUS
  Filled 2019-12-28 (×2): qty 0.4

## 2019-12-28 MED ORDER — DEXTROSE IN LACTATED RINGERS 5 % IV SOLN: INTRAVENOUS | Status: DC

## 2019-12-28 MED ORDER — INSULIN ASPART 100 UNIT/ML ~~LOC~~ SOLN
5.0000 [IU] | Freq: Three times a day (TID) | SUBCUTANEOUS | Status: DC
  Administered 2019-12-28: 5 [IU] via SUBCUTANEOUS
  Filled 2019-12-28: qty 1

## 2019-12-28 MED ORDER — LACTATED RINGERS IV SOLN: INTRAVENOUS | Status: DC

## 2019-12-28 NOTE — Progress Notes (Signed)
MD Ay[place], noted on the unit, updated him about pt's blood glucose level, received new order at this time to give pt 10 (ten) units of sliding scale Novolog insulin. Pt sitting up eating breakfast, 10 units of Novolog insulin given per MD order.

## 2019-12-28 NOTE — Progress Notes (Signed)
Progress Note    Garrett [name]  GYJ:[id number] DOB: 11-18-1985  DOA: 12/27/2019 PCP: Patient, No Pcp Per      Brief Narrative:    Medical records reviewed and are as summarized below:  Garrett [name] is a 34 y.o. male       Assessment/Plan:   Active Problems:   Type 1 diabetes mellitus with hyperosmolar hyperglycemic state (HHS) (HCC)   Body mass index is 21.51 kg/m.    Hyperosmolar hyperglycemic state: He still has significant hyperglycemia.  Hemoglobin A1c is 13.6 and it was 13.23 months ago.  Continue Lantus and Humalog.  Monitor glucose levels closely.  Hypotension: Continue IV fluids.  Lisinopril and metoprolol were given this morning and his BP was okay at that time.  However, these will be discontinued until blood pressure is stabilized.  Vitamin D deficiency: Vitamin D level is 14.  Give 1 dose of vitamin D 50,000 units today.  Patient will be discharged on low-dose vitamin D supplement.  Hypokalemia: Improved  Social worker has been consulted to assist with insurance issues.  He said he still has some insulin at home.    Diet Order            Diet Carb Modified Fluid consistency: Thin; Room service appropriate? Yes  Diet effective now                    Consultants:  None  Procedures:  None    Medications:   . enoxaparin (LOVENOX) injection  40 mg Subcutaneous Q24H  . insulin aspart  0-5 Units Subcutaneous QHS  . insulin aspart  0-9 Units Subcutaneous TID WC  . insulin aspart  3 Units Subcutaneous TID WC  . insulin glargine  40 Units Subcutaneous QHS  . lisinopril  5 mg Oral Daily  . metoprolol tartrate  12.5 mg Oral BID  . pantoprazole  40 mg Oral Daily  . potassium chloride  40 mEq Oral Once   Continuous Infusions: . lactated ringers 125 mL/hr at 12/28/19 0531     Anti-infectives (From admission, onward)   None             Family Communication/Anticipated D/C date and plan/Code Status   DVT prophylaxis:  enoxaparin (LOVENOX) injection 40 mg Start: 12/28/19 1000 SCDs Start: 12/28/19 0010     Code Status: Full Code  Family Communication: None Disposition Plan:    Status is: Inpatient  Remains inpatient appropriate because:Inpatient level of care appropriate due to severity of illness and Hyperglycemia requiring close glucose monitoring   Dispo: The patient is from: Home              Anticipated d/c is to: Home              Anticipated d/c date is: 1 day              Patient currently is not medically stable to d/c.           Subjective:   Patient is frustrated that he has not been able to move to [place] where his [employer] is active.  He said he also had issues transferring his Medicaid insurance from [place] to [place].  He has not been able to see a new primary care physician because of lack of insurance.  He said he has already been to the free clinic but he did not think he got adequate help.  He said he is on parole and  his parole officer wants him to stay in [place].  He said he took his insulin on Thursday, 12/26/2019.  He did not take any insulin yesterday because he was sick.  Objective:    Vitals:   12/27/19 2230 12/28/19 0025 12/28/19 0418 12/28/19 0800  BP: 127/81 (!) 150/95 (!) 145/87 (!) 144/98  Pulse: 86  93 92  Resp: 14 14 16 18   Temp:  97.8 F (36.6 C) 98.2 F (36.8 C) 97.6 F (36.4 C)  TempSrc:  Oral Oral Oral  SpO2: 100% 100% 99%   Weight:  68 kg    Height:  5\' 10"  (1.778 m)     No data found.   Intake/Output Summary (Last 24 hours) at 12/28/2019 1153 Last data filed at 12/28/2019 0531 Gross per 24 hour  Intake 652.01 ml  Output --  Net 652.01 ml   Filed Weights   12/27/19 1422 12/28/19 0025  Weight: 68 kg 68 kg    Exam:  GEN: NAD SKIN: Multiple hyperpigmented scars on upper and lower extremities. EYES: EOMI ENT: MMM CV: RRR PULM: CTA B ABD: soft, ND, NT, +BS CNS: AAO x 3, non focal EXT:  No edema or tenderness   Data Reviewed:   I have personally reviewed following labs and imaging studies:  Labs: Labs show the following:   Basic Metabolic Panel: Recent Labs  Lab 12/27/19 1427 12/27/19 1427 12/27/19 1956 12/27/19 1956 12/28/19 0046 12/28/19 0354  NA 120*  --  136  --  136 136  K 5.3*   < > 3.3*   < > 3.4* 3.7  CL 91*  --  106  --  105 105  CO2 18*  --  19*  --  22 22  GLUCOSE 1,093*  --  168*  --  246* 451*  BUN 30*  --  25*  --  21* 19  CREATININE 1.35*  --  1.08  --  0.91 0.88  CALCIUM 8.2*  --  8.8*  --  8.4* 8.5*  MG  --   --   --   --   --  1.9  PHOS  --   --   --   --   --  2.7   < > = values in this interval not displayed.   GFR Estimated Creatinine Clearance: 111.6 mL/min (by C-G formula based on SCr of 0.88 mg/dL). Liver Function Tests: Recent Labs  Lab 12/27/19 1427  AST 46*  ALT 36  ALKPHOS 231*  BILITOT 1.0  PROT 7.7  ALBUMIN 3.2*   No results for input(s): LIPASE, AMYLASE in the last 168 hours. No results for input(s): AMMONIA in the last 168 hours. Coagulation profile No results for input(s): INR, PROTIME in the last 168 hours.  CBC: Recent Labs  Lab 12/27/19 1427  WBC 6.9  HGB 10.0*  HCT 32.4*  MCV 99.4  PLT 326   Cardiac Enzymes: No results for input(s): CKTOTAL, CKMB, CKMBINDEX, TROPONINI in the last 168 hours. BNP (last 3 results) No results for input(s): PROBNP in the last 8760 hours. CBG: Recent Labs  Lab 12/27/19 1841 12/27/19 1944 12/27/19 2113 12/28/19 0749 12/28/19 1002  GLUCAP 363* 211* 84 476* 400*   D-Dimer: No results for input(s): DDIMER in the last 72 hours. Hgb A1c: Recent Labs    12/28/19 0046  HGBA1C 13.6*   Lipid Profile: No results for input(s): CHOL, HDL, LDLCALC, TRIG, CHOLHDL, LDLDIRECT in the last 72 hours. Thyroid function studies: No results for input(s):  TSH, T4TOTAL, T3FREE, THYROIDAB in the last 72 hours.  Invalid input(s): FREET3 Anemia work up: No results for  input(s): VITAMINB12, FOLATE, FERRITIN, TIBC, IRON, RETICCTPCT in the last 72 hours. Sepsis Labs: Recent Labs  Lab 12/27/19 1427  WBC 6.9    Microbiology Recent Results (from the past 240 hour(s))  Respiratory Panel by RT PCR (Flu A&B, Covid) - Nasopharyngeal Swab     Status: None   Collection Time: 12/27/19  3:08 PM   Specimen: Nasopharyngeal Swab  Result Value Ref Range Status   SARS Coronavirus 2 by RT PCR NEGATIVE NEGATIVE Final    Comment: (NOTE) SARS-CoV-2 target nucleic acids are NOT DETECTED.  The SARS-CoV-2 RNA is generally detectable in upper respiratoy specimens during the acute phase of infection. The lowest concentration of SARS-CoV-2 viral copies this assay can detect is 131 copies/mL. A negative result does not preclude SARS-Cov-2 infection and should not be used as the sole basis for treatment or other patient management decisions. A negative result may occur with  improper specimen collection/handling, submission of specimen other than nasopharyngeal swab, presence of viral mutation(s) within the areas targeted by this assay, and inadequate number of viral copies (<131 copies/mL). A negative result must be combined with clinical observations, patient history, and epidemiological information. The expected result is Negative.  Fact Sheet for Patients:  https://www.moore.com/  Fact Sheet for Healthcare Providers:  https://www.young.biz/  This test is no t yet approved or cleared by the [place] FDA and  has been authorized for detection and/or diagnosis of SARS-CoV-2 by FDA under an Emergency Use Authorization (EUA). This EUA will remain  in effect (meaning this test can be used) for the duration of the COVID-19 declaration under Section 564(b)(1) of the Act, 21 U.S.C. section 360bbb-3(b)(1), unless the authorization is terminated or revoked sooner.     Influenza A by PCR NEGATIVE NEGATIVE Final   Influenza B by  PCR NEGATIVE NEGATIVE Final    Comment: (NOTE) The Xpert Xpress SARS-CoV-2/FLU/RSV assay is intended as an aid in  the diagnosis of influenza from Nasopharyngeal swab specimens and  should not be used as a sole basis for treatment. Nasal washings and  aspirates are unacceptable for Xpert Xpress SARS-CoV-2/FLU/RSV  testing.  Fact Sheet for Patients: https://www.moore.com/  Fact Sheet for Healthcare Providers: https://www.young.biz/  This test is not yet approved or cleared by the [place] FDA and  has been authorized for detection and/or diagnosis of SARS-CoV-2 by  FDA under an Emergency Use Authorization (EUA). This EUA will remain  in effect (meaning this test can be used) for the duration of the  Covid-19 declaration under Section 564(b)(1) of the Act, 21  U.S.C. section 360bbb-3(b)(1), unless the authorization is  terminated or revoked. Performed at [facility], [address] [place], [place] [address]     Procedures and diagnostic studies:  No results found.             LOS: 1 day   Kathyleen Radice  [facility]   Pager on [email or web address]. If 7PM-7AM, please contact night-coverage at [email or web address]     12/28/2019, 11:53 AM

## 2019-12-28 NOTE — Progress Notes (Signed)
CBG 476 at this time - [facility] contacted (called and secure chat) at this time due to blood glucose level is beyond sliding scale coverage orders - awaiting orders at this time.

## 2019-12-28 NOTE — Progress Notes (Signed)
Pt states he is having, loose, watery stools at this time, this nurse informed pt to not flush the toilet so a stool sample can be obtained - awaiting stool sample from pt.

## 2019-12-28 NOTE — Progress Notes (Signed)
Received care of pt resting in bed with cover over his head, alert, verbal and able to make needs and wants known, NAD noted, call bell within reach, will continue to monitor.

## 2019-12-29 DIAGNOSIS — E876 Hypokalemia: Secondary | ICD-10-CM

## 2019-12-29 DIAGNOSIS — E160 Drug-induced hypoglycemia without coma: Secondary | ICD-10-CM | POA: Diagnosis not present

## 2019-12-29 DIAGNOSIS — E1065 Type 1 diabetes mellitus with hyperglycemia: Secondary | ICD-10-CM | POA: Diagnosis not present

## 2019-12-29 DIAGNOSIS — E1069 Type 1 diabetes mellitus with other specified complication: Secondary | ICD-10-CM | POA: Diagnosis not present

## 2019-12-29 DIAGNOSIS — T383X5A Adverse effect of insulin and oral hypoglycemic [antidiabetic] drugs, initial encounter: Secondary | ICD-10-CM

## 2019-12-29 LAB — PHOSPHORUS: Phosphorus: 2.1 mg/dL — ABNORMAL LOW (ref 2.5–4.6)

## 2019-12-29 LAB — BASIC METABOLIC PANEL
Anion gap: 7 (ref 5–15)
BUN: 15 mg/dL (ref 6–20)
CO2: 23 mmol/L (ref 22–32)
Calcium: 8.7 mg/dL — ABNORMAL LOW (ref 8.9–10.3)
Chloride: 103 mmol/L (ref 98–111)
Creatinine, Ser: 0.76 mg/dL (ref 0.61–1.24)
GFR, Estimated: >60 mL/min (ref >60)
Glucose, Bld: 37 mg/dL — CL (ref 70–99)
Potassium: 3.1 mmol/L — ABNORMAL LOW (ref 3.5–5.1)
Sodium: 133 mmol/L — ABNORMAL LOW (ref 135–145)

## 2019-12-29 LAB — GLUCOSE, CAPILLARY
Glucose-Capillary: 160 mg/dL — ABNORMAL HIGH (ref 70–99)
Glucose-Capillary: 24 mg/dL — CL (ref 70–99)
Glucose-Capillary: 180 mg/dL — ABNORMAL HIGH (ref 70–99)

## 2019-12-29 LAB — MAGNESIUM: Magnesium: 1.8 mg/dL (ref 1.7–2.4)

## 2019-12-29 MED ORDER — LOPERAMIDE HCL 2 MG PO CAPS
2.0000 mg | ORAL_CAPSULE | Freq: Once | ORAL | Status: AC
  Administered 2019-12-29: 2 mg via ORAL
  Filled 2019-12-29: qty 1

## 2019-12-29 MED ORDER — POTASSIUM CHLORIDE CRYS ER 20 MEQ PO TBCR
40.0000 meq | EXTENDED_RELEASE_TABLET | ORAL | Status: DC
  Administered 2019-12-29: 40 meq via ORAL
  Filled 2019-12-29: qty 2

## 2019-12-29 MED ORDER — INSULIN ASPART PROT & ASPART (70-30 MIX) 100 UNIT/ML ~~LOC~~ SUSP: 25.0000 [IU] | Freq: Two times a day (BID) | SUBCUTANEOUS | Status: DC

## 2019-12-29 MED ORDER — K PHOS MONO-SOD PHOS DI & MONO 155-852-130 MG PO TABS
250.0000 mg | ORAL_TABLET | Freq: Two times a day (BID) | ORAL | Status: DC
  Filled 2019-12-29 (×2): qty 1

## 2019-12-29 NOTE — Discharge Summary (Signed)
Physician Discharge Summary  Saban Heinlen ZTI:[id number] DOB: 28-May-1985 DOA: 12/27/2019  PCP: Patient, No Pcp Per  Admit date: 12/27/2019 Discharge date: 12/29/2019  Discharge disposition: Left AGAINST MEDICAL ADVICE   Recommendations for Outpatient Follow-Up:   Follow-up with PCP for close management of diabetes had previously been recommended.   Discharge Diagnosis:   Principal Problem:   Type 1 diabetes mellitus with hyperosmolar hyperglycemic state (HHS) (HCC) Active Problems:   Hypoglycemia due to insulin   Hypokalemia    Discharge Condition: Stable.  Diet recommendation:  Diet Order            Diet Carb Modified Fluid consistency: Thin; Room service appropriate? Yes  Diet effective now                   Code Status: Full Code     Hospital Course:   Daichi Moris is a 34 y.o. 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  He presented to the hospital because of feeling poorly blood glucose levels.  While waiting in the ED criteria, patient was found to be lethargic.  His glucose level was 1,093 but anion gap was normal.  He was admitted to the hospital for hyperosmolar hyperglycemic state.  He was treated with IV insulin infusion and IV fluids.  He also developed hypoglycemia requiring adjustments in insulin therapy.  He was also found to have vitamin D deficiency and was given a dose of 50,000 units of vitamin D.  He will be discharged on vitamin D supplement.  He had hypokalemia and hypophosphatemia that were treated.   He was seen earlier in the day and he was okay with the management plan.  Unfortunately, he left the hospital AGAINST MEDICAL ADVICE later in the afternoon.  He did not wait for final discharge recommendations.     Discharge Exam:    Vitals:   12/28/19  1623 12/28/19 2033 12/29/19 0000 12/29/19 0500  BP: 105/77 129/87 130/85 140/82  Pulse: 82 88 85 84  Resp: 16 18  20   Temp: 98 F (36.7 C) 97.8 F (36.6 C) 98 F (36.7 C) 98 F (36.7 C)  TempSrc: Oral Oral  Oral  SpO2: 100% 100% 100% 100%  Weight:      Height:         GEN: NAD SKIN: Multiple hyperpigmented scars on upper and lower extremities. EYES: EOMI ENT: MMM CV: RRR PULM: CTA B ABD: soft, ND, NT, +BS CNS: AAO x 3, non focal EXT: No edema or tenderness   The results of significant diagnostics from this hospitalization (including imaging, microbiology, ancillary and laboratory) are listed below for reference.     Procedures and Diagnostic Studies:   No results found.   Labs:   Basic Metabolic Panel: Recent Labs  Lab 12/27/19 1427 12/27/19 1427 12/27/19 1956 12/27/19 1956 12/28/19 0046 12/28/19 0046 12/28/19 0354 12/29/19 0625  NA 120*  --  136  --  136  --  136 133*  K 5.3*   < > 3.3*   < > 3.4*   < > 3.7 3.1*  CL 91*  --  106  --  105  --  105 103  CO2 18*  --  19*  --  22  --  22 23  GLUCOSE 1,093*  --  168*  --  246*  --  451* 37*  BUN  30*  --  25*  --  21*  --  19 15  CREATININE 1.35*  --  1.08  --  0.91  --  0.88 0.76  CALCIUM 8.2*  --  8.8*  --  8.4*  --  8.5* 8.7*  MG  --   --   --   --   --   --  1.9 1.8  PHOS  --   --   --   --   --   --  2.7 2.1*   < > = values in this interval not displayed.   GFR Estimated Creatinine Clearance: 122.8 mL/min (by C-G formula based on SCr of 0.76 mg/dL). Liver Function Tests: Recent Labs  Lab 12/27/19 1427  AST 46*  ALT 36  ALKPHOS 231*  BILITOT 1.0  PROT 7.7  ALBUMIN 3.2*   No results for input(s): LIPASE, AMYLASE in the last 168 hours. No results for input(s): AMMONIA in the last 168 hours. Coagulation profile No results for input(s): INR, PROTIME in the last 168 hours.  CBC: Recent Labs  Lab 12/27/19 1427  WBC 6.9  HGB 10.0*  HCT 32.4*  MCV 99.4  PLT 326   Cardiac Enzymes: No  results for input(s): CKTOTAL, CKMB, CKMBINDEX, TROPONINI in the last 168 hours. BNP: Invalid input(s): POCBNP CBG: Recent Labs  Lab 12/28/19 2105 12/29/19 0733 12/29/19 0734 12/29/19 0810 12/29/19 1104  GLUCAP 185* 26* 24* 160* 180*   D-Dimer No results for input(s): DDIMER in the last 72 hours. Hgb A1c Recent Labs    12/28/19 0046  HGBA1C 13.6*   Lipid Profile No results for input(s): CHOL, HDL, LDLCALC, TRIG, CHOLHDL, LDLDIRECT in the last 72 hours. Thyroid function studies Recent Labs    12/28/19 0354  TSH 0.790   Anemia work up No results for input(s): VITAMINB12, FOLATE, FERRITIN, TIBC, IRON, RETICCTPCT in the last 72 hours. Microbiology Recent Results (from the past 240 hour(s))  Respiratory Panel by RT PCR (Flu A&B, Covid) - Nasopharyngeal Swab     Status: None   Collection Time: 12/27/19  3:08 PM   Specimen: Nasopharyngeal Swab  Result Value Ref Range Status   SARS Coronavirus 2 by RT PCR NEGATIVE NEGATIVE Final    Comment: (NOTE) SARS-CoV-2 target nucleic acids are NOT DETECTED.  The SARS-CoV-2 RNA is generally detectable in upper respiratoy specimens during the acute phase of infection. The lowest concentration of SARS-CoV-2 viral copies this assay can detect is 131 copies/mL. A negative result does not preclude SARS-Cov-2 infection and should not be used as the sole basis for treatment or other patient management decisions. A negative result may occur with  improper specimen collection/handling, submission of specimen other than nasopharyngeal swab, presence of viral mutation(s) within the areas targeted by this assay, and inadequate number of viral copies (<131 copies/mL). A negative result must be combined with clinical observations, patient history, and epidemiological information. The expected result is Negative.  Fact Sheet for Patients:  https://www.moore.com/  Fact Sheet for Healthcare Providers:    https://www.young.biz/  This test is no t yet approved or cleared by the [place] FDA and  has been authorized for detection and/or diagnosis of SARS-CoV-2 by FDA under an Emergency Use Authorization (EUA). This EUA will remain  in effect (meaning this test can be used) for the duration of the COVID-19 declaration under Section 564(b)(1) of the Act, 21 U.S.C. section 360bbb-3(b)(1), unless the authorization is terminated or revoked sooner.     Influenza A by PCR  NEGATIVE NEGATIVE Final   Influenza B by PCR NEGATIVE NEGATIVE Final    Comment: (NOTE) The Xpert Xpress SARS-CoV-2/FLU/RSV assay is intended as an aid in  the diagnosis of influenza from Nasopharyngeal swab specimens and  should not be used as a sole basis for treatment. Nasal washings and  aspirates are unacceptable for Xpert Xpress SARS-CoV-2/FLU/RSV  testing.  Fact Sheet for Patients: https://www.moore.com/  Fact Sheet for Healthcare Providers: https://www.young.biz/  This test is not yet approved or cleared by the [place] FDA and  has been authorized for detection and/or diagnosis of SARS-CoV-2 by  FDA under an Emergency Use Authorization (EUA). This EUA will remain  in effect (meaning this test can be used) for the duration of the  Covid-19 declaration under Section 564(b)(1) of the Act, 21  U.S.C. section 360bbb-3(b)(1), unless the authorization is  terminated or revoked. Performed at [facility], [address], [place], [place] [address]      Discharge Instructions:       Time coordinating discharge: 32 minutes  Signed:  Lurene Shadow  [facility] 12/29/2019, 2:28 PM   Pager on [email or web address]. If 7PM-7AM, please contact night-coverage at [email or web address]

## 2019-12-29 NOTE — Progress Notes (Addendum)
Progress Note    Garrett [name]  DQQ:[id number] DOB: 1985-11-02  DOA: 12/27/2019 PCP: Patient, No Pcp Per      Brief Narrative:    Medical records reviewed and are as summarized below:  Garrett [name] is a 34 y.o. 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  He presented to the hospital because of feeling poorly blood glucose levels.  While waiting in the ED criteria, patient was found to be lethargic.  His glucose level was 1,093 but anion gap was normal.  He was admitted to the hospital for hyperosmolar hyperglycemic state.  He was treated with IV insulin infusion and IV fluids.  He was also found to have vitamin D deficiency and was given a dose of 50,000 units of vitamin D.  He will be discharged on vitamin D supplement.  He had hypokalemia and hypophosphatemia that were treated.    Assessment/Plan:   Active Problems:   Type 1 diabetes mellitus with hyperosmolar hyperglycemic state (HHS) (HCC)   Hypoglycemia due to insulin   Body mass index is 21.51 kg/m.    IDDM with hyperosmolar hyperglycemic state, recent hypoglycemia: Glucose level dropped to 24 this morning.  It improved to 160 after IV 50% dextrose.  Hemoglobin A1c is 13.6 and it was 13.2 3 months ago.  Substitute insulin 70/30 for Lantus since he takes insulin 70/30 at home.  Monitor glucose levels closely.  Chronic diarrhea is likely from diabetic neuropathy.  Imodium as needed for diarrhea.  Hypotension: Improved.  Discontinue IV fluids.  Lisinopril and metoprolol are still on hold.  Vitamin D deficiency: Vitamin D level is 14.  He got vitamin D 50,000 units on 12/28/2019.  He was discharged on low-dose vitamin D supplement.  Hypokalemia and hypophosphatemia: Replete potassium and phosphorus  Possible discharge to home tomorrow if  glucose levels stabilize.   Diet Order            Diet Carb Modified Fluid consistency: Thin; Room service appropriate? Yes  Diet effective now                    Consultants:  None  Procedures:  None    Medications:   . enoxaparin (LOVENOX) injection  40 mg Subcutaneous Q24H  . insulin aspart  0-5 Units Subcutaneous QHS  . insulin aspart  0-9 Units Subcutaneous TID WC  . insulin aspart  5 Units Subcutaneous TID WC  . insulin aspart protamine- aspart  25 Units Subcutaneous BID WC  . pantoprazole  40 mg Oral Daily  . phosphorus  250 mg Oral BID  . potassium chloride  40 mEq Oral Q4H   Continuous Infusions:    Anti-infectives (From admission, onward)   None             Family Communication/Anticipated D/C date and plan/Code Status   DVT prophylaxis: enoxaparin (LOVENOX) injection 40 mg Start: 12/28/19 1000 SCDs Start: 12/28/19 0010     Code Status: Full Code  Family Communication: None Disposition Plan:    Status is: Inpatient  Remains inpatient appropriate because:Inpatient level of care appropriate due to severity of illness and Hyperglycemia requiring close glucose monitoring   Dispo: The patient is from: Home              Anticipated d/c is to: Home  Anticipated d/c date is: 1 day              Patient currently is not medically stable to d/c.           Subjective:   Interval events noted.  Patient has been having diarrhea.  He has chronic diarrhea.  Objective:    Vitals:   12/28/19 1623 12/28/19 2033 12/29/19 0000 12/29/19 0500  BP: 105/77 129/87 130/85 140/82  Pulse: 82 88 85 84  Resp: 16 18  20   Temp: 98 F (36.7 C) 97.8 F (36.6 C) 98 F (36.7 C) 98 F (36.7 C)  TempSrc: Oral Oral  Oral  SpO2: 100% 100% 100% 100%  Weight:      Height:       No data found.   Intake/Output Summary (Last 24 hours) at 12/29/2019 1215 Last data filed at 12/29/2019 0422 Gross per 24 hour  Intake 2595.82 ml    Output --  Net 2595.82 ml   Filed Weights   12/27/19 1422 12/28/19 0025  Weight: 68 kg 68 kg    Exam:  GEN: NAD SKIN: Multiple hyperpigmented scars on upper and lower extremities. EYES: EOMI ENT: MMM CV: RRR PULM: CTA B ABD: soft, ND, NT, +BS CNS: AAO x 3, non focal EXT: No edema or tenderness    Data Reviewed:   I have personally reviewed following labs and imaging studies:  Labs: Labs show the following:   Basic Metabolic Panel: Recent Labs  Lab 12/27/19 1427 12/27/19 1427 12/27/19 1956 12/27/19 1956 12/28/19 0046 12/28/19 0046 12/28/19 0354 12/29/19 0625  NA 120*  --  136  --  136  --  136 133*  K 5.3*   < > 3.3*   < > 3.4*   < > 3.7 3.1*  CL 91*  --  106  --  105  --  105 103  CO2 18*  --  19*  --  22  --  22 23  GLUCOSE 1,093*  --  168*  --  246*  --  451* 37*  BUN 30*  --  25*  --  21*  --  19 15  CREATININE 1.35*  --  1.08  --  0.91  --  0.88 0.76  CALCIUM 8.2*  --  8.8*  --  8.4*  --  8.5* 8.7*  MG  --   --   --   --   --   --  1.9 1.8  PHOS  --   --   --   --   --   --  2.7 2.1*   < > = values in this interval not displayed.   GFR Estimated Creatinine Clearance: 122.8 mL/min (by C-G formula based on SCr of 0.76 mg/dL). Liver Function Tests: Recent Labs  Lab 12/27/19 1427  AST 46*  ALT 36  ALKPHOS 231*  BILITOT 1.0  PROT 7.7  ALBUMIN 3.2*   No results for input(s): LIPASE, AMYLASE in the last 168 hours. No results for input(s): AMMONIA in the last 168 hours. Coagulation profile No results for input(s): INR, PROTIME in the last 168 hours.  CBC: Recent Labs  Lab 12/27/19 1427  WBC 6.9  HGB 10.0*  HCT 32.4*  MCV 99.4  PLT 326   Cardiac Enzymes: No results for input(s): CKTOTAL, CKMB, CKMBINDEX, TROPONINI in the last 168 hours. BNP (last 3 results) No results for input(s): PROBNP in the last 8760 hours. CBG: Recent Labs  Lab 12/28/19 2105 12/29/19  0109 12/29/19 0734 12/29/19 0810 12/29/19 1104  GLUCAP 185* 26* 24* 160*  180*   D-Dimer: No results for input(s): DDIMER in the last 72 hours. Hgb A1c: Recent Labs    12/28/19 0046  HGBA1C 13.6*   Lipid Profile: No results for input(s): CHOL, HDL, LDLCALC, TRIG, CHOLHDL, LDLDIRECT in the last 72 hours. Thyroid function studies: Recent Labs    12/28/19 0354  TSH 0.790   Anemia work up: No results for input(s): VITAMINB12, FOLATE, FERRITIN, TIBC, IRON, RETICCTPCT in the last 72 hours. Sepsis Labs: Recent Labs  Lab 12/27/19 1427  WBC 6.9    Microbiology Recent Results (from the past 240 hour(s))  Respiratory Panel by RT PCR (Flu A&B, Covid) - Nasopharyngeal Swab     Status: None   Collection Time: 12/27/19  3:08 PM   Specimen: Nasopharyngeal Swab  Result Value Ref Range Status   SARS Coronavirus 2 by RT PCR NEGATIVE NEGATIVE Final    Comment: (NOTE) SARS-CoV-2 target nucleic acids are NOT DETECTED.  The SARS-CoV-2 RNA is generally detectable in upper respiratoy specimens during the acute phase of infection. The lowest concentration of SARS-CoV-2 viral copies this assay can detect is 131 copies/mL. A negative result does not preclude SARS-Cov-2 infection and should not be used as the sole basis for treatment or other patient management decisions. A negative result may occur with  improper specimen collection/handling, submission of specimen other than nasopharyngeal swab, presence of viral mutation(s) within the areas targeted by this assay, and inadequate number of viral copies (<131 copies/mL). A negative result must be combined with clinical observations, patient history, and epidemiological information. The expected result is Negative.  Fact Sheet for Patients:  https://www.moore.com/  Fact Sheet for Healthcare Providers:  https://www.young.biz/  This test is no t yet approved or cleared by the [place] FDA and  has been authorized for detection and/or diagnosis of SARS-CoV-2 by FDA under  an Emergency Use Authorization (EUA). This EUA will remain  in effect (meaning this test can be used) for the duration of the COVID-19 declaration under Section 564(b)(1) of the Act, 21 U.S.C. section 360bbb-3(b)(1), unless the authorization is terminated or revoked sooner.     Influenza A by PCR NEGATIVE NEGATIVE Final   Influenza B by PCR NEGATIVE NEGATIVE Final    Comment: (NOTE) The Xpert Xpress SARS-CoV-2/FLU/RSV assay is intended as an aid in  the diagnosis of influenza from Nasopharyngeal swab specimens and  should not be used as a sole basis for treatment. Nasal washings and  aspirates are unacceptable for Xpert Xpress SARS-CoV-2/FLU/RSV  testing.  Fact Sheet for Patients: https://www.moore.com/  Fact Sheet for Healthcare Providers: https://www.young.biz/  This test is not yet approved or cleared by the [place] FDA and  has been authorized for detection and/or diagnosis of SARS-CoV-2 by  FDA under an Emergency Use Authorization (EUA). This EUA will remain  in effect (meaning this test can be used) for the duration of the  Covid-19 declaration under Section 564(b)(1) of the Act, 21  U.S.C. section 360bbb-3(b)(1), unless the authorization is  terminated or revoked. Performed at [facility], [address] [place], [place] [address]     Procedures and diagnostic studies:  No results found.             LOS: 2 days   Teresita Fanton  [facility]   Pager on [email or web address]. If 7PM-7AM, please contact night-coverage at [email or web address]     12/29/2019, 12:15 PM

## 2019-12-29 NOTE — Progress Notes (Signed)
Patient left AMA ambulating independently with his belongings heading towards the medical mall exit.

## 2019-12-30 LAB — GLUCOSE, CAPILLARY
Glucose-Capillary: >600 mg/dL (ref 70–99)
Glucose-Capillary: 26 mg/dL — CL (ref 70–99)

## 2020-05-15 DEATH — deceased

## 2021-09-21 IMAGING — CR DG FOOT COMPLETE 3+V*R*
3 series · 3 of 3 positions shown · non-contrast
Comparison: None.

CLINICAL DATA: Right foot pain and swelling in a diabetic patient.
No known injury.

EXAM:
RIGHT FOOT COMPLETE - 3+ VIEW

[foot ap]
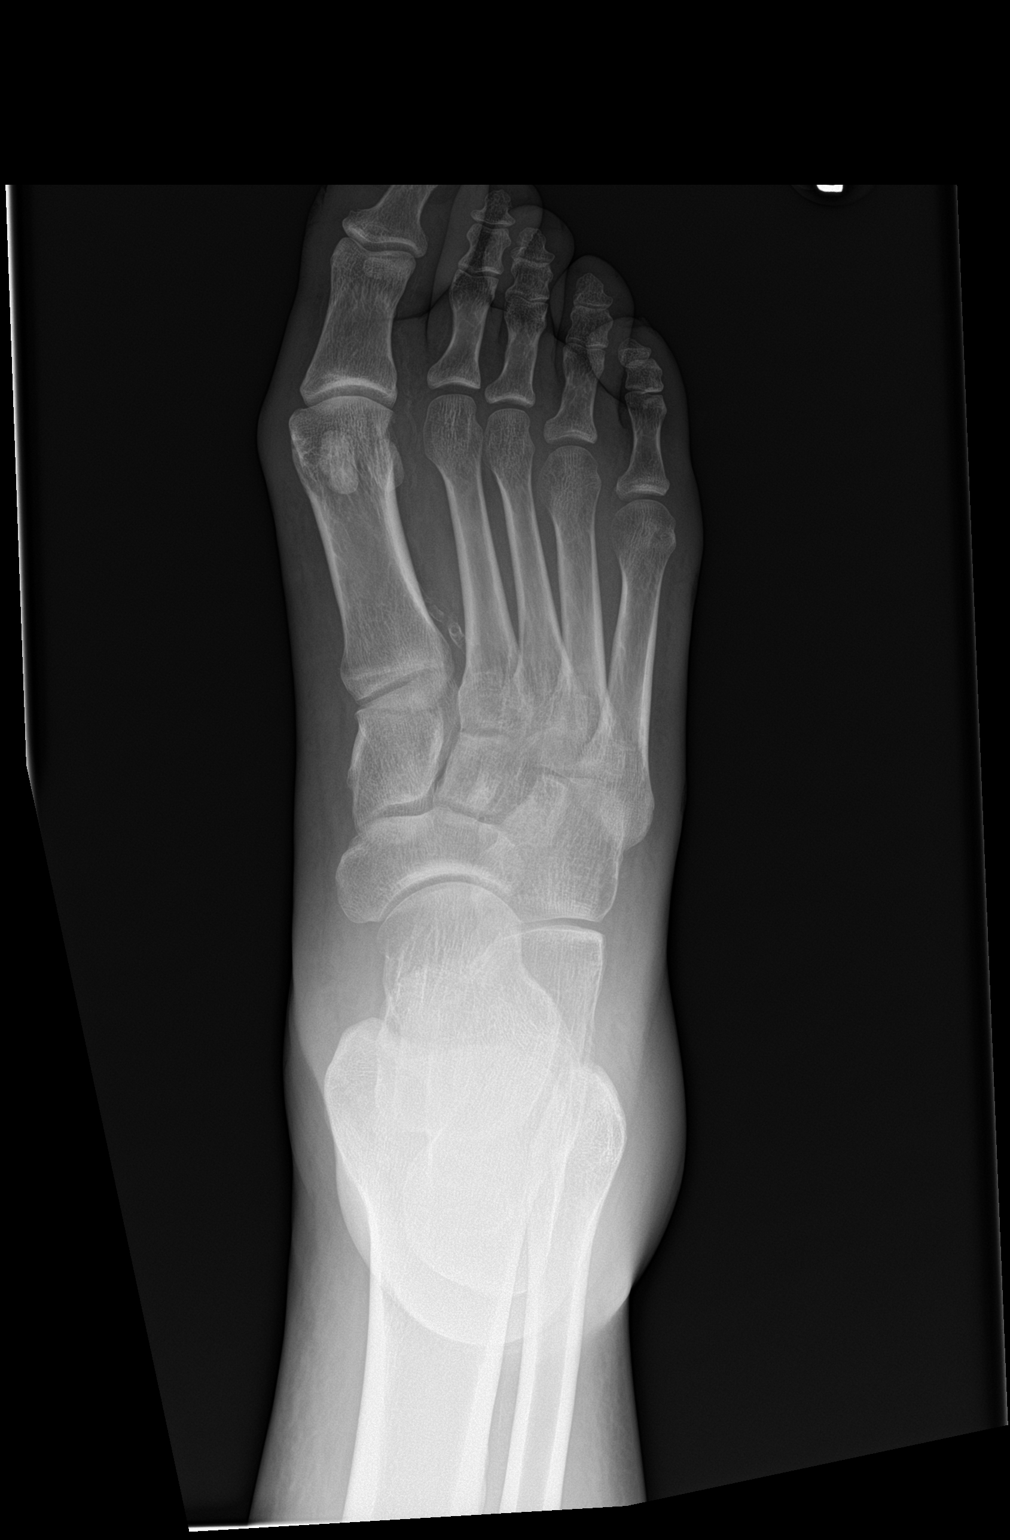

[foot obl]
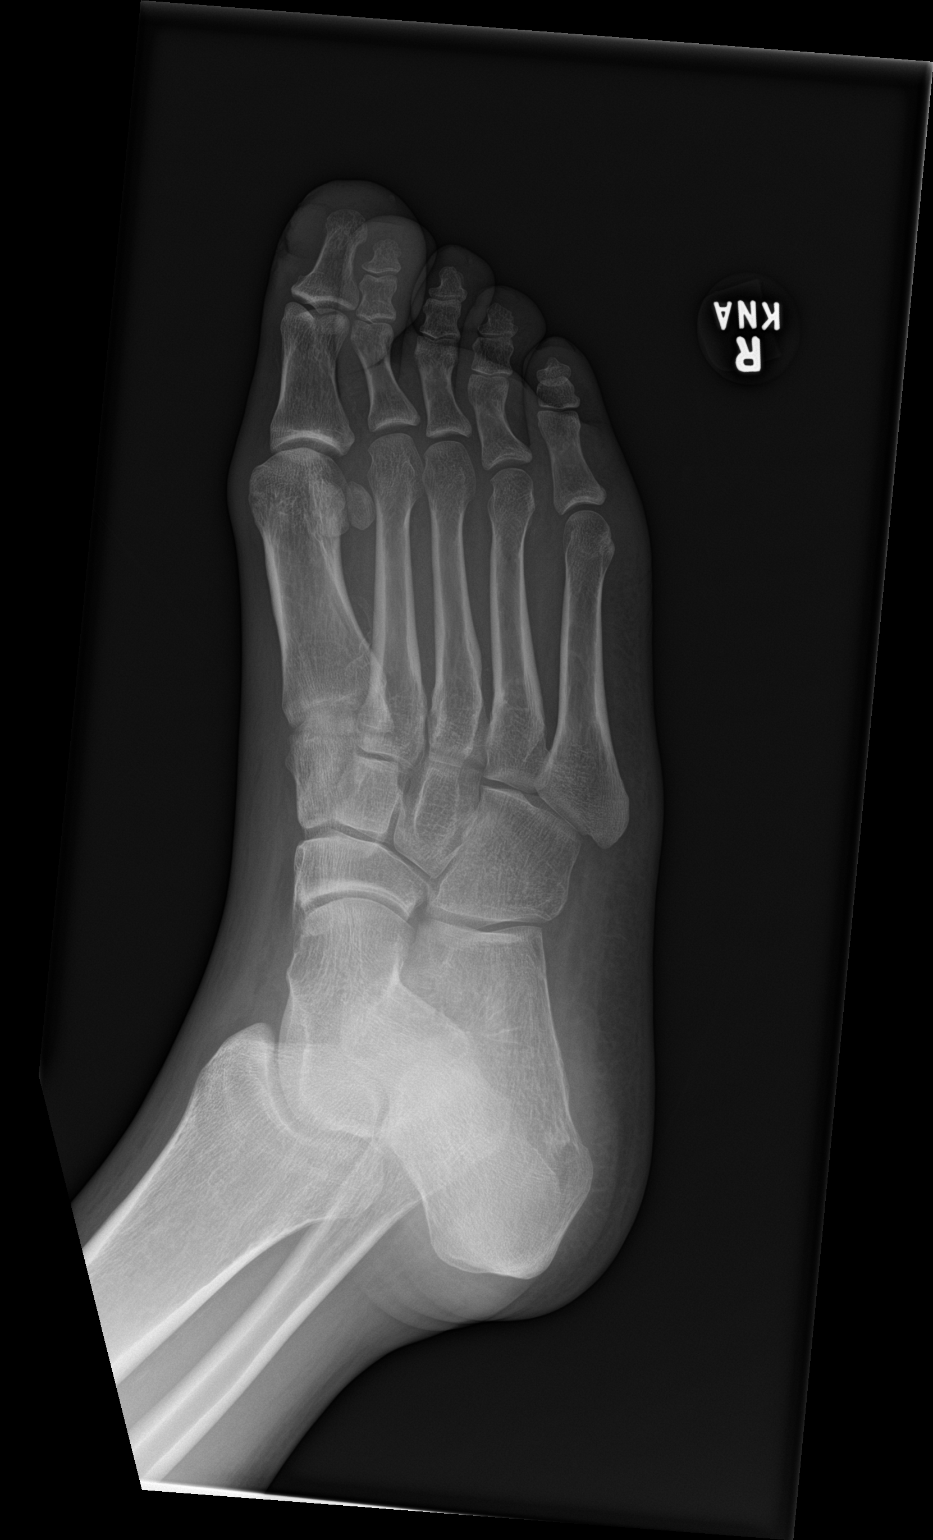

[foot lat]
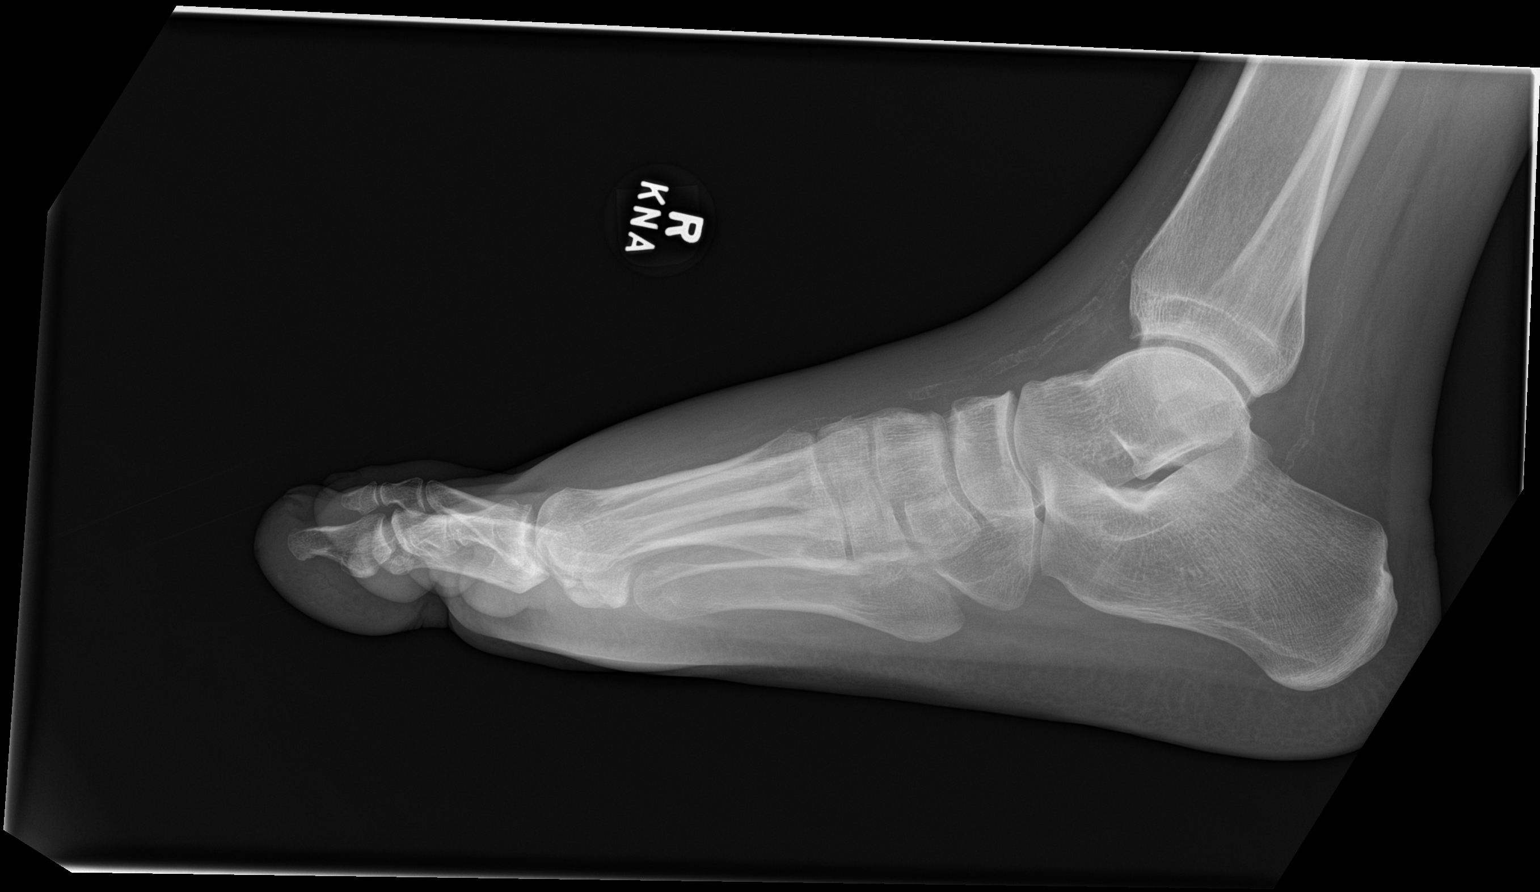

[3 of 3 positions shown; findings below may reference images not displayed]

FINDINGS: Soft tissues are diffusely swollen. No soft tissue gas or radiopaque
foreign body. No bony destructive change or periosteal reaction.
Bones and joints appear normal. Atherosclerosis is noted.
IMPRESSION: Diffuse soft tissue swelling.

No bony or joint abnormality.

Atherosclerosis.

## 2022-02-15 IMAGING — DX DG CHEST 1V PORT
1 series · 1 of 1 positions shown · non-contrast
Comparison: None.

CLINICAL DATA: Fatigue hypercalcemia

EXAM:
PORTABLE CHEST 1 VIEW

[chest ap]
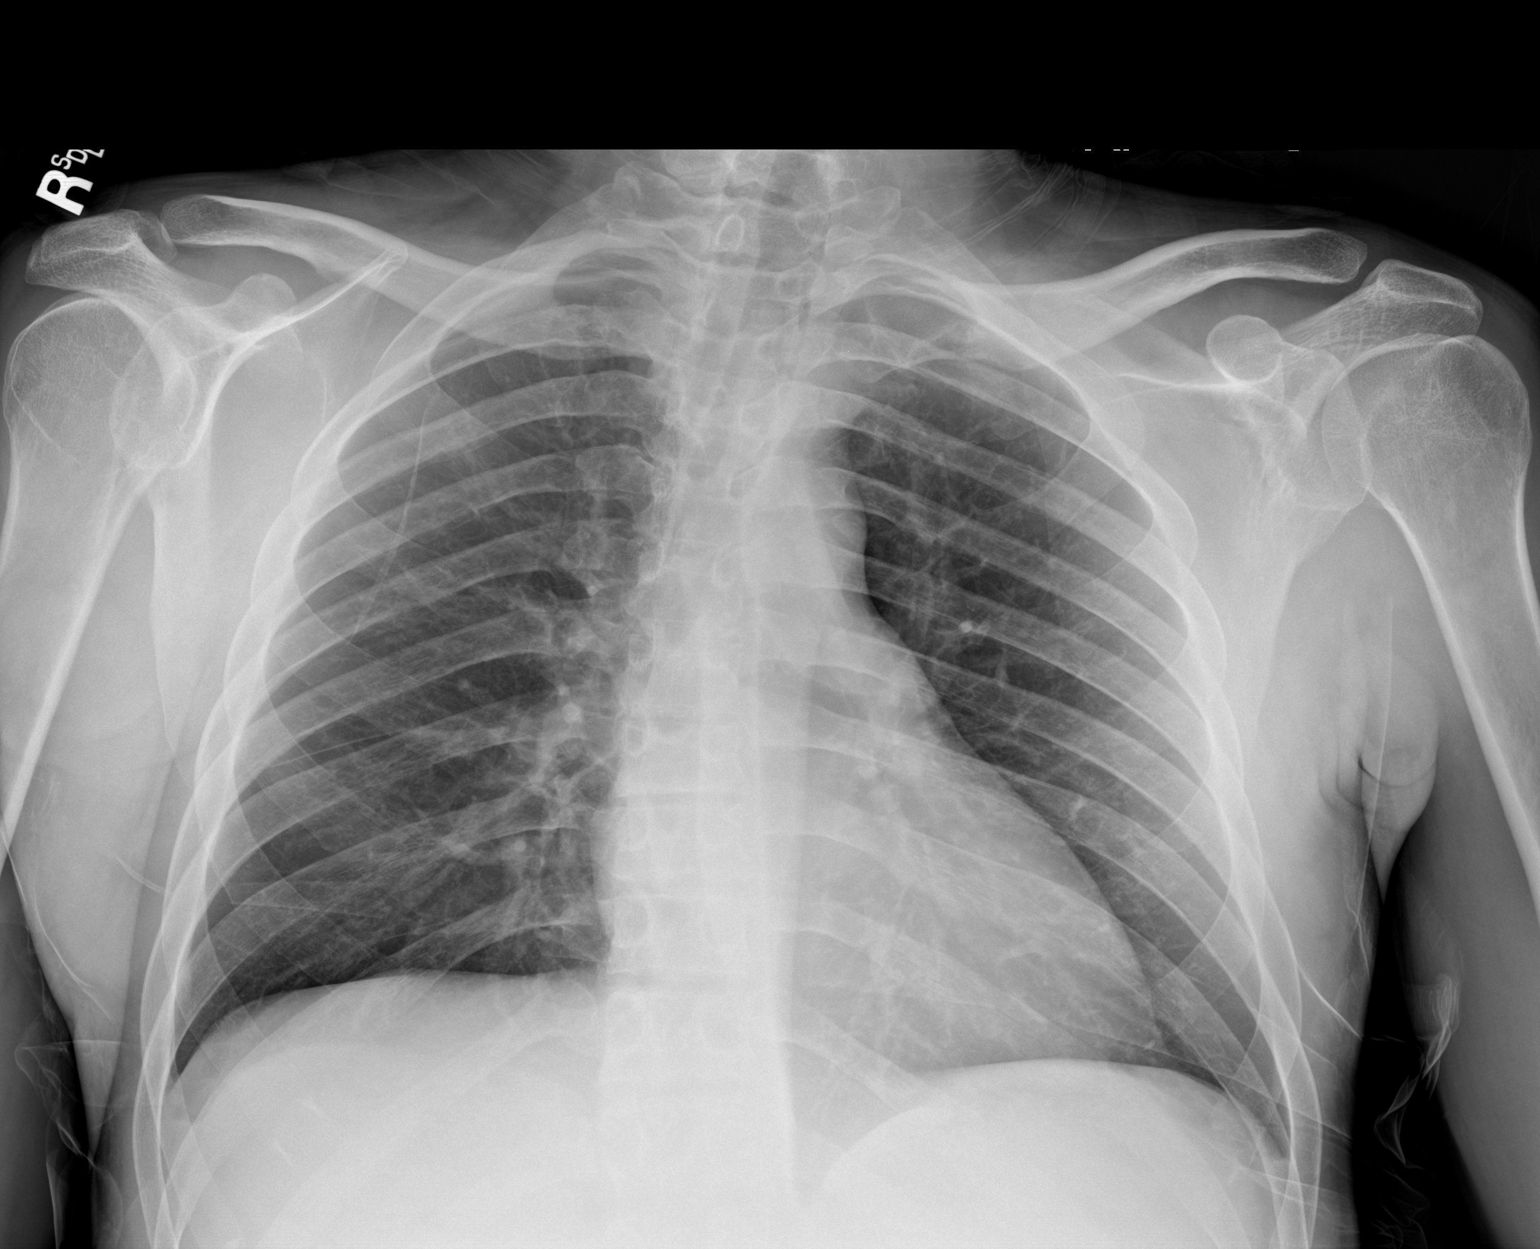

[1 of 1 positions shown; findings below may reference images not displayed]

FINDINGS: The heart size and mediastinal contours are within normal limits.
Both lungs are clear. The visualized skeletal structures are
unremarkable.
IMPRESSION: No active disease.
